# Patient Record
Sex: Female | Born: 1950 | Race: White | Hispanic: No | Smoking: Current every day smoker
Health system: Southern US, Community
[De-identification: ages and names within clinical notes are randomized; demographics above are authoritative.]

## PROBLEM LIST (undated history)

## (undated) DIAGNOSIS — E785 Hyperlipidemia, unspecified: Secondary | ICD-10-CM

## (undated) DIAGNOSIS — F419 Anxiety disorder, unspecified: Secondary | ICD-10-CM

## (undated) DIAGNOSIS — Z9189 Other specified personal risk factors, not elsewhere classified: Secondary | ICD-10-CM

## (undated) DIAGNOSIS — G250 Essential tremor: Secondary | ICD-10-CM

## (undated) DIAGNOSIS — N200 Calculus of kidney: Secondary | ICD-10-CM

## (undated) DIAGNOSIS — F410 Panic disorder [episodic paroxysmal anxiety] without agoraphobia: Secondary | ICD-10-CM

## (undated) DIAGNOSIS — J45909 Unspecified asthma, uncomplicated: Secondary | ICD-10-CM

## (undated) DIAGNOSIS — F32A Depression, unspecified: Secondary | ICD-10-CM

## (undated) DIAGNOSIS — C50919 Malignant neoplasm of unspecified site of unspecified female breast: Secondary | ICD-10-CM

## (undated) DIAGNOSIS — F329 Major depressive disorder, single episode, unspecified: Secondary | ICD-10-CM

## (undated) DIAGNOSIS — Z923 Personal history of irradiation: Secondary | ICD-10-CM

## (undated) DIAGNOSIS — J309 Allergic rhinitis, unspecified: Secondary | ICD-10-CM

## (undated) DIAGNOSIS — I10 Essential (primary) hypertension: Secondary | ICD-10-CM

## (undated) HISTORY — DX: Essential (primary) hypertension: I10

## (undated) HISTORY — DX: Panic disorder (episodic paroxysmal anxiety): F41.0

## (undated) HISTORY — PX: ABDOMINAL HYSTERECTOMY: SHX81

## (undated) HISTORY — DX: Essential tremor: G25.0

## (undated) HISTORY — DX: Malignant neoplasm of unspecified site of unspecified female breast: C50.919

## (undated) HISTORY — PX: LIPOSUCTION: SHX10

## (undated) HISTORY — DX: Allergic rhinitis, unspecified: J30.9

## (undated) HISTORY — DX: Major depressive disorder, single episode, unspecified: F32.9

## (undated) HISTORY — PX: CERVICAL ABLATION: SHX5771

## (undated) HISTORY — PX: FOOT SURGERY: SHX648

## (undated) HISTORY — DX: Depression, unspecified: F32.A

## (undated) HISTORY — DX: Unspecified asthma, uncomplicated: J45.909

## (undated) HISTORY — DX: Anxiety disorder, unspecified: F41.9

## (undated) HISTORY — PX: DILATION AND CURETTAGE OF UTERUS: SHX78

## (undated) HISTORY — DX: Hyperlipidemia, unspecified: E78.5

## (undated) HISTORY — PX: REDUCTION MAMMAPLASTY: SUR839

---

## 1997-09-15 ENCOUNTER — Emergency Department (HOSPITAL_COMMUNITY): Admission: EM | Admit: 1997-09-15 | Discharge: 1997-09-15 | Payer: Self-pay

## 1998-03-31 ENCOUNTER — Other Ambulatory Visit: Admission: RE | Admit: 1998-03-31 | Discharge: 1998-03-31 | Payer: Self-pay | Admitting: Gynecology

## 1999-01-13 ENCOUNTER — Encounter: Payer: Self-pay | Admitting: Gynecology

## 1999-01-13 ENCOUNTER — Encounter: Admission: RE | Admit: 1999-01-13 | Discharge: 1999-01-13 | Payer: Self-pay | Admitting: Gynecology

## 1999-03-03 ENCOUNTER — Other Ambulatory Visit: Admission: RE | Admit: 1999-03-03 | Discharge: 1999-03-03 | Payer: Self-pay | Admitting: Gynecology

## 1999-09-13 ENCOUNTER — Emergency Department (HOSPITAL_COMMUNITY): Admission: EM | Admit: 1999-09-13 | Discharge: 1999-09-13 | Payer: Self-pay | Admitting: Emergency Medicine

## 1999-12-04 ENCOUNTER — Emergency Department (HOSPITAL_COMMUNITY): Admission: EM | Admit: 1999-12-04 | Discharge: 1999-12-04 | Payer: Self-pay | Admitting: Emergency Medicine

## 1999-12-04 ENCOUNTER — Encounter: Payer: Self-pay | Admitting: Emergency Medicine

## 1999-12-19 ENCOUNTER — Encounter: Payer: Self-pay | Admitting: Cardiology

## 1999-12-19 ENCOUNTER — Ambulatory Visit (HOSPITAL_COMMUNITY): Admission: RE | Admit: 1999-12-19 | Discharge: 1999-12-19 | Payer: Self-pay | Admitting: Cardiology

## 2000-01-20 ENCOUNTER — Encounter: Admission: RE | Admit: 2000-01-20 | Discharge: 2000-01-20 | Payer: Self-pay | Admitting: Gynecology

## 2000-01-20 ENCOUNTER — Encounter: Payer: Self-pay | Admitting: Gynecology

## 2000-04-09 ENCOUNTER — Other Ambulatory Visit: Admission: RE | Admit: 2000-04-09 | Discharge: 2000-04-09 | Payer: Self-pay | Admitting: Gynecology

## 2001-02-11 ENCOUNTER — Encounter: Admission: RE | Admit: 2001-02-11 | Discharge: 2001-02-11 | Payer: Self-pay | Admitting: Gynecology

## 2001-02-11 ENCOUNTER — Encounter: Payer: Self-pay | Admitting: Gynecology

## 2001-04-25 ENCOUNTER — Other Ambulatory Visit: Admission: RE | Admit: 2001-04-25 | Discharge: 2001-04-25 | Payer: Self-pay | Admitting: Gynecology

## 2002-06-16 ENCOUNTER — Other Ambulatory Visit: Admission: RE | Admit: 2002-06-16 | Discharge: 2002-06-16 | Payer: Self-pay | Admitting: Gynecology

## 2002-06-17 ENCOUNTER — Encounter: Payer: Self-pay | Admitting: Gynecology

## 2002-06-17 ENCOUNTER — Encounter: Admission: RE | Admit: 2002-06-17 | Discharge: 2002-06-17 | Payer: Self-pay | Admitting: Gynecology

## 2003-01-06 ENCOUNTER — Emergency Department (HOSPITAL_COMMUNITY): Admission: EM | Admit: 2003-01-06 | Discharge: 2003-01-07 | Payer: Self-pay | Admitting: Emergency Medicine

## 2003-07-01 ENCOUNTER — Encounter: Admission: RE | Admit: 2003-07-01 | Discharge: 2003-07-01 | Payer: Self-pay | Admitting: Cardiology

## 2003-07-13 ENCOUNTER — Other Ambulatory Visit: Admission: RE | Admit: 2003-07-13 | Discharge: 2003-07-13 | Payer: Self-pay | Admitting: Obstetrics and Gynecology

## 2003-08-27 ENCOUNTER — Encounter (INDEPENDENT_AMBULATORY_CARE_PROVIDER_SITE_OTHER): Payer: Self-pay | Admitting: Specialist

## 2003-08-27 ENCOUNTER — Ambulatory Visit (HOSPITAL_COMMUNITY): Admission: RE | Admit: 2003-08-27 | Discharge: 2003-08-27 | Payer: Self-pay | Admitting: *Deleted

## 2004-03-14 ENCOUNTER — Ambulatory Visit: Payer: Self-pay | Admitting: Internal Medicine

## 2004-04-06 ENCOUNTER — Ambulatory Visit: Payer: Self-pay | Admitting: Internal Medicine

## 2004-04-11 ENCOUNTER — Ambulatory Visit: Payer: Self-pay | Admitting: Internal Medicine

## 2004-04-13 ENCOUNTER — Ambulatory Visit: Payer: Self-pay | Admitting: Internal Medicine

## 2004-05-09 ENCOUNTER — Ambulatory Visit: Payer: Self-pay | Admitting: Internal Medicine

## 2004-05-31 ENCOUNTER — Ambulatory Visit: Payer: Self-pay | Admitting: Internal Medicine

## 2004-06-07 ENCOUNTER — Ambulatory Visit: Payer: Self-pay | Admitting: Internal Medicine

## 2004-06-22 ENCOUNTER — Ambulatory Visit: Payer: Self-pay | Admitting: Internal Medicine

## 2004-08-24 ENCOUNTER — Ambulatory Visit: Payer: Self-pay | Admitting: Internal Medicine

## 2004-10-11 ENCOUNTER — Other Ambulatory Visit: Admission: RE | Admit: 2004-10-11 | Discharge: 2004-10-11 | Payer: Self-pay | Admitting: Obstetrics and Gynecology

## 2005-01-20 ENCOUNTER — Ambulatory Visit: Payer: Self-pay | Admitting: Internal Medicine

## 2005-03-16 ENCOUNTER — Ambulatory Visit: Payer: Self-pay | Admitting: Internal Medicine

## 2005-04-20 ENCOUNTER — Encounter: Payer: Self-pay | Admitting: Cardiology

## 2005-06-27 ENCOUNTER — Ambulatory Visit: Payer: Self-pay | Admitting: Internal Medicine

## 2005-12-08 ENCOUNTER — Encounter: Admission: RE | Admit: 2005-12-08 | Discharge: 2005-12-08 | Payer: Self-pay | Admitting: Obstetrics and Gynecology

## 2006-01-02 HISTORY — PX: EXCISION MORTON'S NEUROMA: SHX5013

## 2006-01-10 ENCOUNTER — Ambulatory Visit: Payer: Self-pay | Admitting: Internal Medicine

## 2006-01-23 ENCOUNTER — Encounter: Payer: Self-pay | Admitting: Vascular Surgery

## 2006-01-23 ENCOUNTER — Ambulatory Visit (HOSPITAL_COMMUNITY): Admission: RE | Admit: 2006-01-23 | Discharge: 2006-01-23 | Payer: Self-pay | Admitting: Orthopedic Surgery

## 2006-05-10 ENCOUNTER — Ambulatory Visit: Payer: Self-pay | Admitting: Internal Medicine

## 2006-05-15 ENCOUNTER — Ambulatory Visit: Payer: Self-pay | Admitting: Pain Medicine

## 2006-08-29 ENCOUNTER — Ambulatory Visit: Payer: Self-pay | Admitting: Internal Medicine

## 2007-03-19 ENCOUNTER — Ambulatory Visit: Payer: Self-pay | Admitting: Internal Medicine

## 2007-03-26 ENCOUNTER — Ambulatory Visit: Payer: Self-pay | Admitting: Internal Medicine

## 2007-03-26 DIAGNOSIS — I10 Essential (primary) hypertension: Secondary | ICD-10-CM | POA: Insufficient documentation

## 2007-03-26 DIAGNOSIS — H1045 Other chronic allergic conjunctivitis: Secondary | ICD-10-CM

## 2007-03-26 DIAGNOSIS — L509 Urticaria, unspecified: Secondary | ICD-10-CM

## 2007-03-26 DIAGNOSIS — J4520 Mild intermittent asthma, uncomplicated: Secondary | ICD-10-CM

## 2007-03-26 DIAGNOSIS — J3089 Other allergic rhinitis: Secondary | ICD-10-CM

## 2007-03-26 DIAGNOSIS — J302 Other seasonal allergic rhinitis: Secondary | ICD-10-CM

## 2007-05-09 ENCOUNTER — Encounter: Payer: Self-pay | Admitting: Internal Medicine

## 2007-09-18 ENCOUNTER — Ambulatory Visit: Payer: Self-pay | Admitting: Internal Medicine

## 2007-10-25 ENCOUNTER — Encounter: Payer: Self-pay | Admitting: Cardiology

## 2007-11-08 ENCOUNTER — Telehealth (INDEPENDENT_AMBULATORY_CARE_PROVIDER_SITE_OTHER): Payer: Self-pay | Admitting: *Deleted

## 2007-11-18 ENCOUNTER — Encounter: Admission: RE | Admit: 2007-11-18 | Discharge: 2007-11-18 | Payer: Self-pay | Admitting: Family Medicine

## 2008-03-26 ENCOUNTER — Ambulatory Visit: Payer: Self-pay | Admitting: Internal Medicine

## 2008-04-17 ENCOUNTER — Ambulatory Visit: Payer: Self-pay | Admitting: Internal Medicine

## 2008-07-25 ENCOUNTER — Emergency Department (HOSPITAL_COMMUNITY): Admission: EM | Admit: 2008-07-25 | Discharge: 2008-07-25 | Payer: Self-pay | Admitting: Emergency Medicine

## 2008-07-26 ENCOUNTER — Emergency Department (HOSPITAL_COMMUNITY): Admission: EM | Admit: 2008-07-26 | Discharge: 2008-07-26 | Payer: Self-pay | Admitting: Emergency Medicine

## 2008-07-27 ENCOUNTER — Ambulatory Visit: Payer: Self-pay | Admitting: Internal Medicine

## 2008-07-27 ENCOUNTER — Telehealth (INDEPENDENT_AMBULATORY_CARE_PROVIDER_SITE_OTHER): Payer: Self-pay | Admitting: *Deleted

## 2009-04-20 ENCOUNTER — Ambulatory Visit: Payer: Self-pay | Admitting: Internal Medicine

## 2009-11-10 ENCOUNTER — Ambulatory Visit: Payer: Self-pay | Admitting: Cardiology

## 2010-01-26 ENCOUNTER — Emergency Department (HOSPITAL_BASED_OUTPATIENT_CLINIC_OR_DEPARTMENT_OTHER)
Admission: EM | Admit: 2010-01-26 | Discharge: 2010-01-26 | Payer: Self-pay | Source: Home / Self Care | Admitting: Emergency Medicine

## 2010-02-01 NOTE — Assessment & Plan Note (Signed)
Summary: 12 months/apc   Primary Provider/Referring Provider:  Maryelizabeth Rowan  CC:  yearly follow up visit-reactions to meds; nasal drainage-yellow; wheezing(noticed for about 3 months); not taking allergy vaccine.Marland Kitchen  History of Present Illness: 03/26/08- Asthma, allergic rhinitis, allergic conjunctivitis Continues allergy vaccine, often forgets and averages about once or twice monthly. She does feel they help, but is reluctant to increase. No significant reactions. If she missed a couple or has a local reaction she will drop back a tenth. Perennial watery rhinorhea, nonseasonal, withut sneezel. Sleeps on therapeutic pilow. Travels a lot for pleasure. Airplane trip 3 days ago- ears popped more and has had some pain in right ear which has reovved. No chest tightness, wheeze or purulence. Chest feels ok. CXR last year reported minimal interstital prominence- former smoker.  July 27, 2008--Presents for acute office visit. Was on flagyl for bacterial infection, on last day  broke out in hives 3 days ago. Went  to ER on 7/24, was given benadryl, pepcid and steroids. Continued w/ l puffy eyes  facial swelling, throat tightness, went back to ER 7/25/ was given epi pen. today some better w/ hives but face still puffy. Mild tightness in throat, no dysphagia or cough. On prednisone taper 50mg  now.   April 20, 2009- Asthma, allergic rhinitis, allergic conjunctivitis Reviewed hx of urticaria with PCN and to Flagyl,  last year. Had a significant depression last year- working with psychiatrist. Many medicine changes. Dropped off allergy vaccine 3 months ago.She now sees yellow mucus from nose. Maxillary pressure, ears stopped up. She has a Neti pot. Using Flonase daily. Has had only minimal wheeze. Has a rescue inhaler - not used.      Current Medications (verified): 1)  Flonase 50 Mcg/act  Susp (Fluticasone Propionate) .Marland Kitchen.. 1 Spray in Eash Nostril Once Daily 2)  Zyrtec Allergy 10 Mg  Tabs (Cetirizine Hcl)  .... Take 1 By Mouth Once Daily 3)  Ipratropium Bromide 0.06 % Soln (Ipratropium Bromide) .Marland Kitchen.. 1-2 Sprays Each Nostril Three Times A Day As Needed 4)  Allergy Vaccine 1:50 Go (W-E) 5)  Epipen 2-Pak 0.3 Mg/0.37ml (1:1000)  Devi (Epinephrine Hcl (Anaphylaxis)) .... Use As Directed As Needed 6)  Toprol Xl 25 Mg Xr24h-Tab (Metoprolol Succinate) .... Take 1 By Mouth Two Times A Day 7)  Diovan 40 Mg  Tabs (Valsartan) .... Take 1 By Mouth Two Times A Day 8)  Fish Oil 1000 Mg  Caps (Omega-3 Fatty Acids) .... Take 1 By Mouth Two Times A Day 9)  Xanax 0.5 Mg  Tabs (Alprazolam) .... Take 1 By Mouth Once Daily As Needed (Not To Exceed 3/day) 10)  Advil 200 Mg  Tabs (Ibuprofen) .... As Needed 11)  Activella 0.5-0.1 Mg Tabs (Estradiol-Norethindrone Acet) .... Take 1 By Mouth Once Daily 12)  Trilipix 135 Mg Cpdr (Choline Fenofibrate) .... Take 1 By Mouth Once Daily 13)  Zantac 150 Mg Tabs (Ranitidine Hcl) .... As Needed 14)  Diphenhydramine Hcl (Sleep) 50 Mg Tabs (Diphenhydramine Hcl (Sleep)) .Marland Kitchen.. 1 Tab By Mouth Every 4 Hours As Needed 15)  Vitamin D 400 Unit Caps (Cholecalciferol) .... Take 1 By Mouth Once Daily 16)  Pristiq 50 Mg Xr24h-Tab (Desvenlafaxine Succinate) .... Take 1/2 By Mouth Once Daily 17)  Fexofenadine Hcl 180 Mg Tabs (Fexofenadine Hcl) .... Take 1 By Mouth Once Daily 18)  Fluoxetine Hcl 20 Mg Caps (Fluoxetine Hcl) .... Take 1 By Mouth Once Daily  Allergies: 1)  ! Sulfa 2)  ! * Oxycodone 3)  ! Amoxicillin 4)  !  Pcn 5)  ! Flagyl  Past History:  Past Medical History: Last updated: 03/26/2008 Allergic Rhinitis Asthma Hypertension Left breast reduction for benign asymmetry.  Past Surgical History: Last updated: 03/26/2008 D&C and Uterine ablation  Family History: Last updated: 03/26/2008 Father - heart disease Mother- seasonal allergies  Social History: Last updated: 03/26/2008 Patient states former smoker. -quit 1987 Married  Risk Factors: Smoking Status: quit  (03/26/2008)  Review of Systems      See HPI  The patient denies anorexia, fever, weight loss, weight gain, vision loss, decreased hearing, hoarseness, chest pain, syncope, dyspnea on exertion, peripheral edema, prolonged cough, headaches, hemoptysis, and severe indigestion/heartburn.    Vital Signs:  Patient profile:   60 year old female Height:      68 inches Weight:      170.13 pounds BMI:     25.96 O2 Sat:      99 % on Room air Pulse rate:   84 / minute BP sitting:   142 / 90  (left arm) Cuff size:   regular  Vitals Entered By: Reynaldo Minium CMA (April 20, 2009 1:41 PM)  O2 Flow:  Room air  Physical Exam  Additional Exam:  General: A/Ox3; pleasant and cooperative, NAD, wdwn SKIN: no rash, lesions NODES: no lymphadenopathy HEENT: [place]/AT, EOM- WNL, Conjuctivae- clear, PERRLA, TM-WNL, Nose- clear mucus, Throat- clear and wnl, Mallampati II NECK: Supple w/ fair ROM, JVD- none, normal carotid impulses w/o bruits Thyroid CHEST: Clear to P&A- no crackles heard HEART: RRR, no m/g/r heard ABDOMEN:  EAV:WUJW, nl pulses, no edema  NEURO: Grossly intact to observation       Impression & Recommendations:  Problem # 1:  ALLERGIC RHINITIS (ICD-477.9)  We will leave her off allergy vaccine. Suggest nonsedating antihistamine if needed. Encourage Neti pot. Her updated medication list for this problem includes:    Flonase 50 Mcg/act Susp (Fluticasone propionate) .Marland Kitchen... 1 spray in eash nostril once daily    Zyrtec Allergy 10 Mg Tabs (Cetirizine hcl) .Marland Kitchen... Take 1 by mouth once daily    Ipratropium Bromide 0.06 % Soln (Ipratropium bromide) .Marland Kitchen... 1-2 sprays each nostril three times a day as needed    Fexofenadine Hcl 180 Mg Tabs (Fexofenadine hcl) .Marland Kitchen... Take 1 by mouth once daily  Problem # 2:  Hx of URTICARIA (ICD-708.9)  2 episodes of urticaria. These may have  been related to 2 different antibiotics, but that seems unlikely out of the blue. She is maintaining on Zyrtec for now. We  discussed common triggers for urticaria/ angioedema.  Problem # 3:  ASTHMA (ICD-493.90) controlled  Medications Added to Medication List This Visit: 1)  Epipen 0.3 Mg/0.59ml Devi (Epinephrine) .... For severe allergic reaction 2)  Vitamin D 400 Unit Caps (Cholecalciferol) .... Take 1 by mouth once daily 3)  Pristiq 50 Mg Xr24h-tab (Desvenlafaxine succinate) .... Take 1/2 by mouth once daily 4)  Fexofenadine Hcl 180 Mg Tabs (Fexofenadine hcl) .... Take 1 by mouth once daily 5)  Fluoxetine Hcl 20 Mg Caps (Fluoxetine hcl) .... Take 1 by mouth once daily 6)  Zithromax Z-pak 250 Mg Tabs (Azithromycin) .... 2 today then one daily  Other Orders: Est. Patient Level III (11914)  Patient Instructions: 1)  Please schedule a follow-up appointment in 6 months. 2)  We are stopping allergy vaccine 3)  OK to use Zyrtec/ cetirizine. You can substitute or add a second antihistamine like Allegra/ fexofenadine or Claritin/ loratadine if needed.  4)  Remember your Neti pot 5)  Script  to hold for z pak Prescriptions: ZITHROMAX Z-PAK 250 MG TABS (AZITHROMYCIN) 2 today then one daily  #1 pak x 0   Entered and Authorized by:   Waymon Budge MD   Signed by:   Waymon Budge MD on 04/20/2009   Method used:   Print then Give to Patient   RxID:   [id number] IPRATROPIUM BROMIDE 0.06 % SOLN (IPRATROPIUM BROMIDE) 1-2 sprays each nostril three times a day as needed  #1 x prn   Entered and Authorized by:   Waymon Budge MD   Signed by:   Waymon Budge MD on 04/20/2009   Method used:   Electronically to        [facility]  [phone][facility] (retail)       [address] [place], [place]  [address]       Ph: [phone] or [phone]       Fax: [phone]   RxID:   [id number] FLONASE 50 MCG/ACT  SUSP (FLUTICASONE PROPIONATE) 1 spray in eash nostril once daily  #1 x prn   Entered and Authorized by:   Waymon Budge MD   Signed by:   Waymon Budge MD on 04/20/2009   Method used:    [facility] to        [phone] (retail)       [address] [place], [place]  [address]       Ph: [phone] or [phone]       Fax: [phone]   RxID:   [id number] EPIPEN 0.3 MG/0.3ML DEVI (EPINEPHRINE) For severe allergic reaction  #1 x prn   Entered and Authorized by:   Waymon Budge MD   Signed by:   Waymon Budge MD on 04/20/2009   Method used:   Print then Give to Patient   RxID:   [id number]

## 2010-05-17 NOTE — Assessment & Plan Note (Signed)
[facility]                             PULMONARY OFFICE NOTE   CATHLINE, DOWEN                  MRN:          [id number]  DATE:05/10/2006                            DOB:          [birth year]    PULMONARY OUTPATIENT FOLLOWUP   PROBLEM:  1. Allergic rhinitis.  2. Asthma.  3. Allergic conjunctivitis.  4. History of urticaria.  5. Hypertension.   HISTORY:  One year followup.  She has done extremely well and is quite  pleased with her symptom control now.  This has been a bad spring for  some people around her, but she feels that her allergy vaccine at 1:50,  Flonase and Zyrtec are providing good control.  She only occasionally  uses ipratropium 0.06% nasal spray and has not needed albuterol in a  very long time with no chest tightness or wheeze.  We discussed allergy  vaccine, risk and benefit, long term goals and issues of administration  outside of a medical office, anaphylaxis and epinephrine.  We refilled  her EpiPen which she has never needed, but understands.   MEDICATION:  1. Zyrtec.  2. Flonase.  3. Allergy vaccine.  4. Lasix.  5. Toprol.  6. Diovan.  7. Ipratropium 0.06% nasal spray.  8. Vivelle patch.  9. Prometrium.  10.Spinal injections.  11.Fish oil.  12.Xanax.  13.Potassium.  14.Rare need for albuterol inhaler.  15.EpiPen is available.   Drug intolerant to SULFA with urticaria.   OBJECTIVE:  Weight 170 pounds.  BP 110/80, pulse 64.  Room air  saturation 98%.  Eyes, nose and throat are clear.  CHEST:  Is clear.  HEART:  Sounds regular without murmur.  No evident rash on exposed skin.   IMPRESSION:  Good control of allergic rhinitis and allergic  conjunctivitis.  Asthma is essentially inactive.   PLAN:  Continue present therapy  Schedule return in 1 year.  Educational  discussion, review of risk etc. as above.  Questions answered.     Clinton D. Maple Hudson, MD, Tonny Bollman, FACP  Electronically Signed    CDY/MedQ   DD: 05/10/2006  DT: 05/11/2006  Job #: [id number]   cc:   C. Duane Lope, M.D.  Colleen Can. Deborah Chalk, M.D.

## 2011-02-22 ENCOUNTER — Other Ambulatory Visit: Payer: Self-pay | Admitting: Obstetrics and Gynecology

## 2011-02-22 DIAGNOSIS — R928 Other abnormal and inconclusive findings on diagnostic imaging of breast: Secondary | ICD-10-CM

## 2011-03-02 ENCOUNTER — Ambulatory Visit
Admission: RE | Admit: 2011-03-02 | Discharge: 2011-03-02 | Disposition: A | Discharge status: Home / Self Care | Payer: 59 | Source: Ambulatory Visit | Attending: Obstetrics and Gynecology | Admitting: Obstetrics and Gynecology

## 2011-03-02 DIAGNOSIS — R928 Other abnormal and inconclusive findings on diagnostic imaging of breast: Secondary | ICD-10-CM

## 2011-04-12 ENCOUNTER — Encounter: Payer: Self-pay | Admitting: *Deleted

## 2011-07-26 ENCOUNTER — Telehealth: Payer: Self-pay | Admitting: Nurse Practitioner

## 2011-07-26 NOTE — Telephone Encounter (Signed)
Will forward to [facility].  Need to find old [place] dictation as to why he wanted pt taking Diovan instead of generic and fax to Larita Fife, MD.

## 2011-07-26 NOTE — Telephone Encounter (Signed)
New msg Pt wants to know why she cant generic for diovan. She was a former Music therapist pt. Please call

## 2011-09-25 ENCOUNTER — Telehealth: Payer: Self-pay | Admitting: Nurse Practitioner

## 2011-09-25 NOTE — Telephone Encounter (Signed)
Patient would like to speak with Dawayne Patricia. To discuss medication  plz return call to patient, [phone]

## 2011-09-25 NOTE — Telephone Encounter (Signed)
Sent dictation that stated allergies to ARB and ACE to pcp and patient

## 2011-09-25 NOTE — Telephone Encounter (Signed)
Needs to know why she can not take Diovan generic for her insurance to cover. Have requested her chart from medical records

## 2011-09-29 ENCOUNTER — Encounter: Payer: Self-pay | Admitting: Cardiology

## 2011-10-06 ENCOUNTER — Encounter: Payer: Self-pay | Admitting: Cardiology

## 2012-04-16 ENCOUNTER — Other Ambulatory Visit: Payer: Self-pay | Admitting: Obstetrics and Gynecology

## 2012-04-16 DIAGNOSIS — N644 Mastodynia: Secondary | ICD-10-CM

## 2012-04-30 ENCOUNTER — Ambulatory Visit
Admission: RE | Admit: 2012-04-30 | Discharge: 2012-04-30 | Disposition: A | Discharge status: Home / Self Care | Payer: 59 | Source: Ambulatory Visit | Attending: Obstetrics and Gynecology | Admitting: Obstetrics and Gynecology

## 2012-04-30 DIAGNOSIS — N644 Mastodynia: Secondary | ICD-10-CM

## 2013-01-15 ENCOUNTER — Emergency Department (HOSPITAL_COMMUNITY): Payer: 59

## 2013-01-15 ENCOUNTER — Encounter (HOSPITAL_COMMUNITY): Payer: Self-pay | Admitting: Emergency Medicine

## 2013-01-15 ENCOUNTER — Emergency Department (HOSPITAL_COMMUNITY)
Admission: EM | Admit: 2013-01-15 | Discharge: 2013-01-15 | Disposition: A | Discharge status: Home / Self Care | Payer: 59 | Attending: Emergency Medicine | Admitting: Emergency Medicine

## 2013-01-15 DIAGNOSIS — Z880 Allergy status to penicillin: Secondary | ICD-10-CM | POA: Insufficient documentation

## 2013-01-15 DIAGNOSIS — N200 Calculus of kidney: Secondary | ICD-10-CM

## 2013-01-15 DIAGNOSIS — R630 Anorexia: Secondary | ICD-10-CM | POA: Insufficient documentation

## 2013-01-15 DIAGNOSIS — N23 Unspecified renal colic: Secondary | ICD-10-CM

## 2013-01-15 DIAGNOSIS — R143 Flatulence: Secondary | ICD-10-CM

## 2013-01-15 DIAGNOSIS — R141 Gas pain: Secondary | ICD-10-CM | POA: Insufficient documentation

## 2013-01-15 DIAGNOSIS — F3289 Other specified depressive episodes: Secondary | ICD-10-CM | POA: Insufficient documentation

## 2013-01-15 DIAGNOSIS — F329 Major depressive disorder, single episode, unspecified: Secondary | ICD-10-CM | POA: Insufficient documentation

## 2013-01-15 DIAGNOSIS — F411 Generalized anxiety disorder: Secondary | ICD-10-CM | POA: Insufficient documentation

## 2013-01-15 DIAGNOSIS — Z79899 Other long term (current) drug therapy: Secondary | ICD-10-CM | POA: Insufficient documentation

## 2013-01-15 DIAGNOSIS — Z8719 Personal history of other diseases of the digestive system: Secondary | ICD-10-CM | POA: Insufficient documentation

## 2013-01-15 DIAGNOSIS — R142 Eructation: Secondary | ICD-10-CM | POA: Insufficient documentation

## 2013-01-15 DIAGNOSIS — Z8639 Personal history of other endocrine, nutritional and metabolic disease: Secondary | ICD-10-CM | POA: Insufficient documentation

## 2013-01-15 DIAGNOSIS — Z862 Personal history of diseases of the blood and blood-forming organs and certain disorders involving the immune mechanism: Secondary | ICD-10-CM | POA: Insufficient documentation

## 2013-01-15 DIAGNOSIS — I10 Essential (primary) hypertension: Secondary | ICD-10-CM | POA: Insufficient documentation

## 2013-01-15 LAB — CBC WITH DIFFERENTIAL/PLATELET
Basophils Absolute: 0.1 10*3/uL (ref 0.0–0.1)
Basophils Relative: 1 % (ref 0–1)
Eosinophils Absolute: 0.3 10*3/uL (ref 0.0–0.7)
Eosinophils Relative: 5 % (ref 0–5)
HCT: 45.7 % (ref 36.0–46.0)
HEMOGLOBIN: 15.7 g/dL — AB (ref 12.0–15.0)
LYMPHS ABS: 1.5 10*3/uL (ref 0.7–4.0)
LYMPHS PCT: 24 % (ref 12–46)
MCH: 35 pg — AB (ref 26.0–34.0)
MCHC: 34.4 g/dL (ref 30.0–36.0)
MCV: 101.8 fL — ABNORMAL HIGH (ref 78.0–100.0)
MONOS PCT: 9 % (ref 3–12)
Monocytes Absolute: 0.6 10*3/uL (ref 0.1–1.0)
NEUTROS ABS: 4 10*3/uL (ref 1.7–7.7)
NEUTROS PCT: 62 % (ref 43–77)
PLATELETS: 239 10*3/uL (ref 150–400)
RBC: 4.49 MIL/uL (ref 3.87–5.11)
RDW: 13.5 % (ref 11.5–15.5)
WBC: 6.5 10*3/uL (ref 4.0–10.5)

## 2013-01-15 LAB — URINALYSIS, ROUTINE W REFLEX MICROSCOPIC
BILIRUBIN URINE: NEGATIVE
GLUCOSE, UA: NEGATIVE mg/dL
Ketones, ur: NEGATIVE mg/dL
Leukocytes, UA: NEGATIVE
Nitrite: NEGATIVE
PH: 6 (ref 5.0–8.0)
Protein, ur: 30 mg/dL — AB
SPECIFIC GRAVITY, URINE: 1.013 (ref 1.005–1.030)
Urobilinogen, UA: 0.2 mg/dL (ref 0.0–1.0)

## 2013-01-15 LAB — COMPREHENSIVE METABOLIC PANEL
ALK PHOS: 49 U/L (ref 39–117)
ALT: 24 U/L (ref 0–35)
AST: 27 U/L (ref 0–37)
Albumin: 4 g/dL (ref 3.5–5.2)
BUN: 14 mg/dL (ref 6–23)
CO2: 23 mEq/L (ref 19–32)
Calcium: 9.7 mg/dL (ref 8.4–10.5)
Chloride: 95 mEq/L — ABNORMAL LOW (ref 96–112)
Creatinine, Ser: 0.72 mg/dL (ref 0.50–1.10)
GFR calc non Af Amer: >90 mL/min (ref >90)
GLUCOSE: 104 mg/dL — AB (ref 70–99)
POTASSIUM: 4 meq/L (ref 3.7–5.3)
SODIUM: 136 meq/L — AB (ref 137–147)
Total Bilirubin: 0.5 mg/dL (ref 0.3–1.2)
Total Protein: 7.8 g/dL (ref 6.0–8.3)

## 2013-01-15 LAB — URINE MICROSCOPIC-ADD ON

## 2013-01-15 LAB — LIPASE, BLOOD: Lipase: 30 U/L (ref 11–59)

## 2013-01-15 MED ORDER — ONDANSETRON 4 MG PO TBDP: 4.0000 mg | ORAL_TABLET | Freq: Three times a day (TID) | ORAL | Status: DC | PRN

## 2013-01-15 MED ORDER — OXYCODONE-ACETAMINOPHEN 5-325 MG PO TABS: 1.0000 | ORAL_TABLET | ORAL | Status: DC | PRN

## 2013-01-15 MED ORDER — NAPROXEN 500 MG PO TABS: 500.0000 mg | ORAL_TABLET | Freq: Two times a day (BID) | ORAL | Status: DC

## 2013-01-15 MED ORDER — TAMSULOSIN HCL 0.4 MG PO CAPS: 0.4000 mg | ORAL_CAPSULE | Freq: Two times a day (BID) | ORAL | Status: DC

## 2013-01-15 MED ORDER — MORPHINE SULFATE 4 MG/ML IJ SOLN
4.0000 mg | Freq: Once | INTRAMUSCULAR | Status: AC
  Administered 2013-01-15: 4 mg via INTRAVENOUS
  Filled 2013-01-15: qty 1

## 2013-01-15 MED ORDER — ONDANSETRON HCL 4 MG/2ML IJ SOLN
4.0000 mg | Freq: Once | INTRAMUSCULAR | Status: AC
  Administered 2013-01-15: 4 mg via INTRAVENOUS
  Filled 2013-01-15: qty 2

## 2013-01-15 NOTE — ED Notes (Signed)
Pt was with husband who was being discharged from hospital, had sudden onset of severe abdominal pain bilaterally, worse on left. Reports blood in urine since today. Pain constant. Pt diaphoretic and tearful in triage.

## 2013-01-15 NOTE — Discharge Instructions (Signed)
Follow up with Urology in 2 days. Call and make appointment as soon as possible. Take medications as directed. Return to ED should you develop fever/chills, unable to urinate, or pain becomes unbearable.

## 2013-01-15 NOTE — ED Provider Notes (Signed)
Medical screening examination/treatment/procedure(s) were conducted as a shared visit with non-physician practitioner(s) and myself.  I personally evaluated the patient during the encounter.  EKG Interpretation   None      Patient here with acute onset of bilateral flank pain. Abdominal exam is nonsurgical. Abdominal CT shows a kidney stone. Her pain is controlled and she'll be given urology referral  Leota Jacobsen, MD 01/15/13 1626

## 2013-01-15 NOTE — ED Notes (Signed)
Discharge instructions reviewed with pt. Pt verbalized understanding.   

## 2013-01-15 NOTE — ED Provider Notes (Signed)
CSN: [id number]     Arrival date & time 01/15/13  1403 History   First MD Initiated Contact with Patient 01/15/13 1419     Chief Complaint  Patient presents with  . Abdominal Pain  . Nausea  . Hematuria   (Consider location/radiation/quality/duration/timing/severity/associated sxs/prior Treatment) Patient is a 63 y.o. female presenting with abdominal pain and hematuria.  Abdominal Pain Associated symptoms: hematuria   Associated symptoms: no chest pain, no chills, no fever and no shortness of breath   Hematuria Associated symptoms include abdominal pain. Pertinent negatives include no chest pain, chills or fever.   63 yo female presents with acute onset LEFT flank pain that radiates to pelvis. Pain started at 5 am this morning. Pain crampy, with intermittent sharp shooting pains in pelvis. Pain rated currently at 5/10 though much worse earlier. Patient has not tried anything for pain. Never had these symptoms before. Admits to hematuria today with dysuria, increased frequency, and urgency. Patient admits to constipation, nausea, and states she feels very bloated,. Denies Vomiting / Diarrhea.  Patient denies hx of abdominal surgeries. Admits to family hx of kidney stones. Recent colonoscopy last year with 3 yr follow up scheduled. Patient PMH significant for HTN, Hiatal Hernia.   Patient drinks a glass of wine per night.    Past Medical History  Diagnosis Date  . HTN (hypertension)   . Anxiety   . Depression   . HLD (hyperlipidemia)   . Hiatal hernia    Past Surgical History  Procedure Laterality Date  . Cervical ablation    . Foot surgery     Family History  Problem Relation Age of Onset  . Heart attack    . Coronary artery disease    . Hypertension    . Diabetes     History  Substance Use Topics  . Smoking status: Never Smoker   . Smokeless tobacco: Not on file  . Alcohol Use: Yes     Comment: social   OB History   Grav Para Term Preterm Abortions TAB SAB Ect Mult  Living                 Review of Systems  Constitutional: Positive for appetite change (poor appetite x 1 day). Negative for fever and chills.  Eyes: Negative for visual disturbance.  Respiratory: Negative for chest tightness and shortness of breath.   Cardiovascular: Negative for chest pain and leg swelling.  Gastrointestinal: Positive for abdominal pain and abdominal distention. Negative for blood in stool.  Genitourinary: Positive for hematuria, flank pain, difficulty urinating and pelvic pain.  Allergic/Immunologic: Negative for immunocompromised state.  All other systems reviewed and are negative.    Allergies  Amoxicillin; Metronidazole; Penicillins; and Sulfonamide derivatives  Home Medications   Current Outpatient Rx  Name  Route  Sig  Dispense  Refill  . ALPRAZolam (XANAX) 0.5 MG tablet   Oral   Take 0.5 mg by mouth daily as needed for anxiety.          Marland Kitchen BYSTOLIC 10 MG tablet   Oral   Take 10 mg by mouth daily.         . cetirizine (ZYRTEC) 10 MG tablet   Oral   Take 10 mg by mouth daily.         . cloNIDine (CATAPRES) 0.2 MG tablet   Oral   Take 0.2 mg by mouth daily.         Marland Kitchen desvenlafaxine (PRISTIQ) 50 MG 24 hr tablet  Oral   Take 50 mg by mouth daily.          . famotidine (PEPCID AC) 10 MG tablet   Oral   Take 10 mg by mouth daily as needed for heartburn or indigestion.         . fenofibrate 160 MG tablet   Oral   Take 160 mg by mouth at bedtime.          . fish oil-omega-3 fatty acids 1000 MG capsule   Oral   Take 1 g by mouth 2 (two) times daily.          Marland Kitchen FLUoxetine (PROZAC) 20 MG capsule   Oral   Take 20 mg by mouth daily.         . fluticasone (FLONASE) 50 MCG/ACT nasal spray   Nasal   Place 2 sprays into the nose daily.         Marland Kitchen ibuprofen (ADVIL,MOTRIN) 200 MG tablet   Oral   Take 400 mg by mouth every 6 (six) hours as needed for moderate pain.         . valsartan (DIOVAN) 40 MG tablet   Oral   Take 40  mg by mouth 2 (two) times daily.           BP 186/93  Pulse 69  Temp(Src) 98 F (36.7 C) (Oral)  Resp 24  SpO2 100% Physical Exam  Nursing note and vitals reviewed. Constitutional: She is oriented to person, place, and time. She appears well-developed and well-nourished. No distress.  HENT:  Head: Normocephalic and atraumatic.  Cardiovascular: Normal rate and regular rhythm.  Exam reveals no gallop and no friction rub.   No murmur heard. Pulmonary/Chest: Effort normal and breath sounds normal. No respiratory distress. She has no wheezes. She has no rales.  Abdominal: Soft. Bowel sounds are normal. She exhibits distension. There is no hepatosplenomegaly. There is tenderness in the suprapubic area. There is no rigidity, no rebound, no guarding, no CVA tenderness, no tenderness at McBurney's point and negative Murphy's sign.  Musculoskeletal: Normal range of motion. She exhibits no edema.  Neurological: She is alert and oriented to person, place, and time.  Skin: Skin is warm and dry. She is not diaphoretic.  Psychiatric: She has a normal mood and affect. Her behavior is normal.    ED Course  Procedures (including critical care time) Labs Review Labs Reviewed  CBC WITH DIFFERENTIAL - Abnormal; Notable for the following:    Hemoglobin 15.7 (*)    MCV 101.8 (*)    MCH 35.0 (*)    All other components within normal limits  COMPREHENSIVE METABOLIC PANEL - Abnormal; Notable for the following:    Sodium 136 (*)    Chloride 95 (*)    Glucose, Bld 104 (*)    All other components within normal limits  URINALYSIS, ROUTINE W REFLEX MICROSCOPIC - Abnormal; Notable for the following:    APPearance CLOUDY (*)    Hgb urine dipstick LARGE (*)    Protein, ur 30 (*)    All other components within normal limits  LIPASE, BLOOD  URINE MICROSCOPIC-ADD ON   Imaging Review Ct Abdomen Pelvis Wo Contrast  01/15/2013   CLINICAL DATA:  Sudden onset severe upper pelvic pain, gross hematuria  EXAM:  CT ABDOMEN AND PELVIS WITHOUT CONTRAST  TECHNIQUE: Multidetector CT imaging of the abdomen and pelvis was performed following the standard protocol without intravenous contrast.  COMPARISON:  None.  FINDINGS: Renal: There is 4 mm  calculus in the mid left kidney. There is a mild hydroureter on the left. There is mild stranding in the left renal hilum and along the course of the ureter. There is potential calculus at the vesicoureteral junction on the left. This is seen on the sagittal projection and very faintly (image 69, series coronal). This measures 1-2 mm. On a thin reconstructed data set this stone is seen on image 43. There is no right nephrolithiasis or obstructive uropathy.  Lung bases are clear No focal hepatic lesion on this noncontrast exam. Gallbladder, pancreas, spleen, adrenal glands are normal. Several low-density lesions within the spleen which likely represents is.  The stomach, small bowel, and colon are normal.  Abdominal aorta is normal caliber. There is a left retro aortic renal vein.  No free fluid the pelvis. Uterus and ovaries are normal. There are no stones within the bladder.  Insert negative bone  IMPRESSION: 1. Small 1 mm obstructing calculus at the left vesicoureteral junction. 2. Nonobstructing calculus in the left renal hilum.   Electronically Signed   By: Suzy Bouchard M.D.   On: 01/15/2013 15:41    EKG Interpretation   None       MDM   1. Nephrolithiasis   2. Renal colic on left side    Patient afebrile. Elevate BP suspect secondary to acute pain (patient tearful in room due to pain).   CT positive for LEFT Renal stone measured at 4 mm. Small 34mm obstructing calculus of LEFT Vesicoureteral junction.  Large hgb on UA, no signs of UTI. Lipase normal.   Discussed labs, and exam findings with patient. Recommend return to ED should symptoms worsen. Patient agrees with plan.  Follow up with Urology in 2 days. Call and make appointment as soon as possible. Take  medications as directed. Return to ED should you develop fever/chills, unable to urinate, or pain becomes unbearable.   Patient pain controlled in ED. Plan to have patient follow up with Urology in 2 days. Will treat patients sxs. Patient agrees with plan. Discharged in good condition.    Meds given in ED:  Medications  ondansetron (ZOFRAN) injection 4 mg (4 mg Intravenous Given 01/15/13 1507)  morphine 4 MG/ML injection 4 mg (4 mg Intravenous Given 01/15/13 1507)    Discharge Medication List as of 01/15/2013  4:26 PM    START taking these medications   Details  naproxen (NAPROSYN) 500 MG tablet Take 1 tablet (500 mg total) by mouth 2 (two) times daily with a meal., Starting 01/15/2013, Until Discontinued, Print    ondansetron (ZOFRAN ODT) 4 MG disintegrating tablet Take 1 tablet (4 mg total) by mouth every 8 (eight) hours as needed for nausea., Starting 01/15/2013, Until Discontinued, Print    oxyCODONE-acetaminophen (PERCOCET) 5-325 MG per tablet Take 1 tablet by mouth every 4 (four) hours as needed., Starting 01/15/2013, Until Discontinued, Print    tamsulosin (FLOMAX) 0.4 MG CAPS capsule Take 1 capsule (0.4 mg total) by mouth 2 (two) times daily., Starting 01/15/2013, Until Discontinued, Print          Sherrie George, [place] 01/15/13 2123

## 2013-01-15 NOTE — ED Notes (Signed)
Pt has been having painful urination with blood in urine on and off. Today pt states she developed some abd cramping and some sharp shooting pain in groin and vagina. Pt states she hasn't had a bowel movement in 3 days, pt states she normally has one daily. Pt has been going through some stress recently with death in family.

## 2013-01-15 NOTE — ED Notes (Signed)
Pt states after she urinated she felt better, but still feels as though she has to pee.

## 2013-01-16 NOTE — ED Provider Notes (Signed)
Medical screening examination/treatment/procedure(s) were conducted as a shared visit with non-physician practitioner(s) and myself.  I personally evaluated the patient during the encounter.  EKG Interpretation   None        Leota Jacobsen, MD 01/16/13 1555

## 2013-02-21 ENCOUNTER — Encounter (HOSPITAL_COMMUNITY): Payer: Self-pay | Admitting: Pharmacist

## 2013-02-25 NOTE — H&P (Signed)
63 year old G 2 P 2 with history of abnormal bleeding.  She had taken some hormone therapy from some reproductive hormone clinic that I am unaware of and because of that, she had developed some bleeding.  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  She was advised to come back and have a D and C but she did not and the bleeding continues. She has had an endometrial ablation.    Medical history  Hypertension  Surgical History Endometrial ablation  Meds  See list  Allergic to sulfa, flagyl, penicillin  Afebrile vss General alert and oriented Lung CTAB Car RRR Abdomen is soft and nontender Pelvic is unremarkable  IMPRESSION: Postmenopausal bleeding  PLAN: LAVH and BSO Risks reviewed consent signed.

## 2013-03-05 ENCOUNTER — Encounter (HOSPITAL_COMMUNITY): Payer: Self-pay

## 2013-03-05 ENCOUNTER — Encounter (INDEPENDENT_AMBULATORY_CARE_PROVIDER_SITE_OTHER): Payer: Self-pay

## 2013-03-05 ENCOUNTER — Encounter (HOSPITAL_COMMUNITY)
Admission: RE | Admit: 2013-03-05 | Discharge: 2013-03-05 | Disposition: A | Discharge status: Home / Self Care | Payer: 59 | Source: Ambulatory Visit | Attending: Obstetrics and Gynecology | Admitting: Obstetrics and Gynecology

## 2013-03-05 ENCOUNTER — Other Ambulatory Visit: Payer: Self-pay

## 2013-03-05 DIAGNOSIS — Z01810 Encounter for preprocedural cardiovascular examination: Secondary | ICD-10-CM | POA: Insufficient documentation

## 2013-03-05 DIAGNOSIS — Z01812 Encounter for preprocedural laboratory examination: Secondary | ICD-10-CM | POA: Insufficient documentation

## 2013-03-05 LAB — BASIC METABOLIC PANEL
BUN: 18 mg/dL (ref 6–23)
CALCIUM: 9.4 mg/dL (ref 8.4–10.5)
CO2: 27 meq/L (ref 19–32)
CREATININE: 0.78 mg/dL (ref 0.50–1.10)
Chloride: 95 mEq/L — ABNORMAL LOW (ref 96–112)
GFR calc Af Amer: >90 mL/min (ref >90)
GFR calc non Af Amer: 89 mL/min — ABNORMAL LOW (ref >90)
Glucose, Bld: 102 mg/dL — ABNORMAL HIGH (ref 70–99)
Potassium: 4.3 mEq/L (ref 3.7–5.3)
Sodium: 135 mEq/L — ABNORMAL LOW (ref 137–147)

## 2013-03-05 LAB — CBC
HCT: 41.2 % (ref 36.0–46.0)
Hemoglobin: 13.9 g/dL (ref 12.0–15.0)
MCH: 34 pg (ref 26.0–34.0)
MCHC: 33.7 g/dL (ref 30.0–36.0)
MCV: 100.7 fL — AB (ref 78.0–100.0)
PLATELETS: 226 10*3/uL (ref 150–400)
RBC: 4.09 MIL/uL (ref 3.87–5.11)
RDW: 13.2 % (ref 11.5–15.5)
WBC: 5.7 10*3/uL (ref 4.0–10.5)

## 2013-03-05 NOTE — Patient Instructions (Signed)
[place]  03/05/2013   Your procedure is scheduled on:  03/10/13  Enter through the Main Entrance of [facility] at [place] up the phone at the desk and dial [phone].   Call this number if you have problems the morning of surgery: [phone]   Remember:   Do not eat food:After Midnight.  Do not drink clear liquids: After Midnight.  Take these medicines the morning of surgery with A SIP OF WATER: AM medications   Do not wear jewelry, make-up or nail polish.  Do not wear lotions, powders, or perfumes. You may wear deodorant.  Do not shave 24hours prior to surgery.  Do not bring valuables to the hospital.  [facility] is not   responsible for any belongings or valuables brought to the hospital.  Contacts, dentures or bridgework may not be worn into surgery.  Leave suitcase in the car. After surgery it may be brought to your room.  For patients admitted to the hospital, checkout time is 11:00 AM the day of              discharge.   Patients discharged the day of surgery will not be allowed to drive             home.  Name and phone number of your driver: NA  Special Instructions:      Please read over the following fact sheets that you were given:   Surgical Site Infection Prevention

## 2013-03-09 ENCOUNTER — Encounter (HOSPITAL_COMMUNITY): Payer: Self-pay | Admitting: Anesthesiology

## 2013-03-09 MED ORDER — GENTAMICIN SULFATE 40 MG/ML IJ SOLN
INTRAVENOUS | Status: DC
  Filled 2013-03-09: qty 9

## 2013-03-09 NOTE — Anesthesia Preprocedure Evaluation (Deleted)
Anesthesia Evaluation    Airway       Dental   Pulmonary asthma , former smoker,          Cardiovascular hypertension, Pt. on medications and Pt. on home beta blockers     Neuro/Psych PSYCHIATRIC DISORDERS Anxiety Depression negative neurological ROS     GI/Hepatic negative GI ROS, Neg liver ROS,   Endo/Other  Hyperlipidemia  Renal/GU negative Renal ROS  negative genitourinary   Musculoskeletal negative musculoskeletal ROS (+)   Abdominal   Peds  Hematology negative hematology ROS (+)   Anesthesia Other Findings   Reproductive/Obstetrics PMB                          Anesthesia Physical Anesthesia Plan  ASA: II  Anesthesia Plan: General   Post-op Pain Management:    Induction: Intravenous  Airway Management Planned: Oral ETT  Additional Equipment:   Intra-op Plan:   Post-operative Plan: Extubation in OR  Informed Consent: I have reviewed the patients History and Physical, chart, labs and discussed the procedure including the risks, benefits and alternatives for the proposed anesthesia with the patient or authorized representative who has indicated his/her understanding and acceptance.   Dental advisory given  Plan Discussed with: Anesthesiologist, CRNA and Surgeon  Anesthesia Plan Comments:         Anesthesia Quick Evaluation

## 2013-03-10 ENCOUNTER — Ambulatory Visit (HOSPITAL_COMMUNITY): Admission: RE | Admit: 2013-03-10 | Payer: 59 | Source: Ambulatory Visit | Admitting: Obstetrics and Gynecology

## 2013-03-10 ENCOUNTER — Encounter (HOSPITAL_COMMUNITY): Admission: RE | Payer: Self-pay | Source: Ambulatory Visit

## 2013-03-10 SURGERY — HYSTERECTOMY, VAGINAL, LAPAROSCOPY-ASSISTED
Anesthesia: General

## 2013-04-15 ENCOUNTER — Encounter (HOSPITAL_COMMUNITY): Payer: Self-pay | Admitting: Pharmacist

## 2013-04-22 ENCOUNTER — Encounter (HOSPITAL_COMMUNITY): Payer: Self-pay

## 2013-04-22 ENCOUNTER — Encounter: Payer: Self-pay | Admitting: Internal Medicine

## 2013-04-22 ENCOUNTER — Encounter (HOSPITAL_COMMUNITY)
Admission: RE | Admit: 2013-04-22 | Discharge: 2013-04-22 | Disposition: A | Discharge status: Home / Self Care | Payer: 59 | Source: Ambulatory Visit | Attending: Obstetrics and Gynecology | Admitting: Obstetrics and Gynecology

## 2013-04-22 ENCOUNTER — Ambulatory Visit (INDEPENDENT_AMBULATORY_CARE_PROVIDER_SITE_OTHER): Payer: 59 | Admitting: Internal Medicine

## 2013-04-22 ENCOUNTER — Ambulatory Visit (INDEPENDENT_AMBULATORY_CARE_PROVIDER_SITE_OTHER)
Admission: RE | Admit: 2013-04-22 | Discharge: 2013-04-22 | Disposition: A | Discharge status: Home / Self Care | Payer: 59 | Source: Ambulatory Visit | Attending: Internal Medicine | Admitting: Internal Medicine

## 2013-04-22 VITALS — BP 116/76 | HR 73 | Ht 68.0 in | Wt 180.2 lb

## 2013-04-22 DIAGNOSIS — L509 Urticaria, unspecified: Secondary | ICD-10-CM

## 2013-04-22 DIAGNOSIS — Z87891 Personal history of nicotine dependence: Secondary | ICD-10-CM

## 2013-04-22 DIAGNOSIS — J4520 Mild intermittent asthma, uncomplicated: Secondary | ICD-10-CM

## 2013-04-22 DIAGNOSIS — J309 Allergic rhinitis, unspecified: Secondary | ICD-10-CM

## 2013-04-22 DIAGNOSIS — J3089 Other allergic rhinitis: Secondary | ICD-10-CM

## 2013-04-22 DIAGNOSIS — J45909 Unspecified asthma, uncomplicated: Secondary | ICD-10-CM

## 2013-04-22 DIAGNOSIS — J302 Other seasonal allergic rhinitis: Secondary | ICD-10-CM

## 2013-04-22 LAB — BASIC METABOLIC PANEL
BUN: 15 mg/dL (ref 6–23)
CALCIUM: 8.9 mg/dL (ref 8.4–10.5)
CHLORIDE: 99 meq/L (ref 96–112)
CO2: 27 meq/L (ref 19–32)
Creatinine, Ser: 0.69 mg/dL (ref 0.50–1.10)
GFR calc Af Amer: >90 mL/min (ref >90)
GFR calc non Af Amer: >90 mL/min (ref >90)
Glucose, Bld: 98 mg/dL (ref 70–99)
Potassium: 4.4 mEq/L (ref 3.7–5.3)
Sodium: 139 mEq/L (ref 137–147)

## 2013-04-22 LAB — CBC
HCT: 41.9 % (ref 36.0–46.0)
Hemoglobin: 14.2 g/dL (ref 12.0–15.0)
MCH: 34.8 pg — AB (ref 26.0–34.0)
MCHC: 33.9 g/dL (ref 30.0–36.0)
MCV: 102.7 fL — AB (ref 78.0–100.0)
PLATELETS: 230 10*3/uL (ref 150–400)
RBC: 4.08 MIL/uL (ref 3.87–5.11)
RDW: 13.7 % (ref 11.5–15.5)
WBC: 5.2 10*3/uL (ref 4.0–10.5)

## 2013-04-22 MED ORDER — AZELASTINE-FLUTICASONE 137-50 MCG/ACT NA SUSP: 2.0000 | Freq: Every day | NASAL | Status: DC

## 2013-04-22 MED ORDER — GENTAMICIN SULFATE 40 MG/ML IJ SOLN
INTRAMUSCULAR | Status: AC
  Administered 2013-04-23: 114.7 mL via INTRAVENOUS
  Filled 2013-04-22: qty 8.75

## 2013-04-22 MED ORDER — LACTATED RINGERS IV SOLN: INTRAVENOUS | Status: DC

## 2013-04-22 NOTE — H&P (Signed)
Cynthia Mccullough is an 63 y.o. female presents with postmenopausal bleeding  D and C last year revealed complex hyperplasia and no atypia.   Pertinent Gynecological History: Menses: flow is moderate Bleeding: post menopausal bleeding Contraception: none DES exposure: denies Blood transfusions: none Sexually transmitted diseases: no past history Previous GYN Procedures: DNC  Last mammogram: normal Date: 2014  Last pap: normal Date: 2014 OB History: G2, P2  Menstrual History: Menarche age: unknown  No LMP recorded. Patient is postmenopausal.    Past Medical History  Diagnosis Date  . HTN (hypertension)   . Anxiety   . Depression   . HLD (hyperlipidemia)   . Asthma   . Allergic rhinitis   . Essential tremor     Past Surgical History  Procedure Laterality Date  . Cervical ablation    . Foot surgery    . Dilation and curettage of uterus    . Liposuction      Family History  Problem Relation Age of Onset  . Heart attack Father   . Coronary artery disease Father   . Hypertension Father   . Diabetes Father     Social History:  reports that she quit smoking about 33 years ago. Her smoking use included Cigarettes. She has a 14 pack-year smoking history. She does not have any smokeless tobacco history on file. She reports that she drinks about 1.2 ounces of alcohol per week. She reports that she does not use illicit drugs.  Allergies:  Allergies  Allergen Reactions  . Metronidazole Hives  . Penicillins Anaphylaxis    REACTION: Urticaria/ angioedema  . Sulfonamide Derivatives Hives    REACTION: urticaria  . Amoxicillin Hives    No prescriptions prior to admission    Review of Systems  All other systems reviewed and are negative.   Height 5\' 8"  (1.727 m), weight 79.379 kg (175 lb). Physical Exam  Nursing note and vitals reviewed. Constitutional: She is oriented to person, place, and time. She appears well-developed.  HENT:  Head: Normocephalic.  Eyes:  Pupils are equal, round, and reactive to light.  Neck: Normal range of motion.  Cardiovascular: Normal rate.   Respiratory: Effort normal.  GI: Soft.  Genitourinary: Vagina normal and uterus normal.  Neurological: She is alert and oriented to person, place, and time.  Skin: Skin is warm and dry.    Results for orders placed during the hospital encounter of 04/22/13 (from the past 24 hour(s))  CBC     Status: Abnormal   Collection Time    04/22/13 12:05 PM      Result Value Ref Range   WBC 5.2  4.0 - 10.5 K/uL   RBC 4.08  3.87 - 5.11 MIL/uL   Hemoglobin 14.2  12.0 - 15.0 g/dL   HCT 41.9  36.0 - 46.0 %   MCV 102.7 (*) 78.0 - 100.0 fL   MCH 34.8 (*) 26.0 - 34.0 pg   MCHC 33.9  30.0 - 36.0 g/dL   RDW 13.7  11.5 - 15.5 %   Platelets 230  150 - 400 K/uL  BASIC METABOLIC PANEL     Status: None   Collection Time    04/22/13 12:05 PM      Result Value Ref Range   Sodium 139  137 - 147 mEq/L   Potassium 4.4  3.7 - 5.3 mEq/L   Chloride 99  96 - 112 mEq/L   CO2 27  19 - 32 mEq/L   Glucose, Bld 98  70 -  99 mg/dL   BUN 15  6 - 23 mg/dL   Creatinine, Ser 0.69  0.50 - 1.10 mg/dL   Calcium 8.9  8.4 - 10.5 mg/dL   GFR calc non Af Amer >90  >90 mL/min   GFR calc Af Amer >90  >90 mL/min    Dg Chest 2 View  04/22/2013   CLINICAL DATA:  Ex-smoker, no chest complaints  EXAM: CHEST  2 VIEW  COMPARISON:  CT ABD/PELV WO CM dated 01/15/2013; DG CHEST 2 VIEW dated 03/26/2008  FINDINGS: The heart size and mediastinal contours are within normal limits. Both lungs are clear. The visualized skeletal structures are unremarkable.  IMPRESSION: No active cardiopulmonary disease.   Electronically Signed   By: Kathreen Devoid   On: 04/22/2013 15:00    Assessment/Plan: Postmenopausal bleeding LAVH and BSO  Risks reviewed  Will proceed  Consent signed  Cyril Mourning 04/22/2013, 5:26 PM

## 2013-04-22 NOTE — Patient Instructions (Signed)
Cynthia Mccullough  04/22/2013   Your procedure is scheduled on:  04/23/13  Enter through the Main Entrance of [facility] at [place] up the phone at the desk and dial [phone].   Call this number if you have problems the morning of surgery: [phone]   Remember:   Do not eat food:After Midnight.  Do not drink clear liquids: After Midnight.  Take these medicines the morning of surgery with A SIP OF WATER: blood pressure medication.   Do not wear jewelry, make-up or nail polish.  Do not wear lotions, powders, or perfumes. You may wear deodorant.  Do not shave 48 hours prior to surgery.  Do not bring valuables to the hospital.  [facility] is not   responsible for any belongings or valuables brought to the hospital.  Contacts, dentures or bridgework may not be worn into surgery.  Leave suitcase in the car. After surgery it may be brought to your room.  For patients admitted to the hospital, checkout time is 11:00 AM the day of              discharge.   Patients discharged the day of surgery will not be allowed to drive             home.  Name and phone number of your driver: NA  Special Instructions:      Please read over the following fact sheets that you were given:   Surgical Site Infection Prevention

## 2013-04-22 NOTE — Assessment & Plan Note (Signed)
Very good control. Do not expect a problem for her hysterectomy. Plan-chest x-ray

## 2013-04-22 NOTE — Progress Notes (Signed)
04/22/13- 39 yoF former smoker Former [place] chest patient-saw Dr Gerilyn Nestle, then [place]. Has not had a CXR in about 5 years. Would like to have CXR today as STAT  as she is due for hysterectomy tomorrow. Daughter is here. Dr Melford Aase PCP. She had tonsillitis during the last winter. Perennial nasal congestion, somewhat worse in spring and fall. Zyrtec no longer helps. Nasal discharge is always clear. Denies cough, phlegm or shortness of breath with routine activity. Still has a rescue inhaler used maybe once or twice a year. No routine wheezing. 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 She is a homemaker, living with her husband who has esophageal cancer. She quit smoking many years ago.  Prior to Admission medications   Medication Sig Start Date End Date Taking? Authorizing Provider  ALPRAZolam Duanne Moron) 0.5 MG tablet Take 0.5 mg by mouth daily as needed for anxiety.    Yes Historical Provider, MD  BYSTOLIC 10 MG tablet Take 10 mg by mouth daily. 12/31/12  Yes Historical Provider, MD  cetirizine (ZYRTEC) 10 MG tablet Take 10 mg by mouth daily.   Yes Historical Provider, MD  cloNIDine (CATAPRES) 0.2 MG tablet Take 0.2 mg by mouth daily. 12/25/12  Yes Historical Provider, MD  desvenlafaxine (PRISTIQ) 50 MG 24 hr tablet Take 50 mg by mouth daily.    Yes Historical Provider, MD  famotidine (PEPCID AC) 10 MG tablet Take 10 mg by mouth daily as needed for heartburn or indigestion.   Yes Historical Provider, MD  fenofibrate 160 MG tablet Take 160 mg by mouth at bedtime.    Yes Historical Provider, MD  fish oil-omega-3 fatty acids 1000 MG capsule Take 1 g by mouth daily.    Yes Historical Provider, MD  FLUoxetine (PROZAC) 10 MG capsule Take 10 mg by mouth daily.   Yes Historical Provider, MD  fluticasone (FLONASE) 50 MCG/ACT nasal spray Place 2 sprays into the nose daily.   Yes Historical Provider, MD  ibuprofen (ADVIL,MOTRIN) 200 MG tablet Take 400 mg by mouth every 6 (six) hours as needed for moderate  pain.   Yes Historical Provider, MD  Plant Sterols and Stanols (CHOLESTOFF PLUS) 450 MG CAPS Take 1 capsule by mouth daily.   Yes Historical Provider, MD  valsartan (DIOVAN) 40 MG tablet Take 40 mg by mouth 2 (two) times daily.    Yes Historical Provider, MD  VENTOLIN HFA 108 (90 BASE) MCG/ACT inhaler Inhale 2 puffs into the lungs 4 (four) times daily as needed. 03/07/13  Yes Historical Provider, MD  Azelastine-Fluticasone (DYMISTA) 137-50 MCG/ACT SUSP Place 2 sprays into both nostrils at bedtime. 04/22/13   Deneise Lever, MD   Past Medical History  Diagnosis Date  . HTN (hypertension)   . Anxiety   . Depression   . HLD (hyperlipidemia)   . Asthma   . Allergic rhinitis   . Essential tremor    Past Surgical History  Procedure Laterality Date  . Cervical ablation    . Foot surgery    . Dilation and curettage of uterus    . Liposuction     Family History  Problem Relation Age of Onset  . Heart attack Father   . Coronary artery disease Father   . Hypertension Father   . Diabetes Father    History   Social History  . Marital Status: Married    Spouse Name: N/A    Number of Children: 2  . 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 Smoking status: Former Smoker -- 1.00 packs/day for 14 years    Types: Cigarettes    Quit date: 04/22/1980  . Smokeless tobacco: Not on file  . Alcohol Use: 1.2 oz/week    2 Glasses of wine per week     Comment: white  . Drug Use: No  . Sexual Activity: Not on file   Other Topics Concern  . Not on file   Social History Narrative  . No narrative on file   ROS-see HPI Constitutional:   No-   weight loss, night sweats, fevers, chills, fatigue, lassitude. HEENT:   No-  headaches, difficulty swallowing, tooth/dental problems, sore throat,       No-  sneezing, itching, ear ache, nasal congestion, post nasal drip,  CV:  No-   chest pain, orthopnea, PND, swelling in lower extremities,  anasarca,                                  dizziness, palpitations Resp: No-   shortness of breath with exertion or at rest.              No-   productive cough,  No non-productive cough,  No- coughing up of blood.              No-   change in color of mucus.  No- wheezing.   Skin: No-   rash or lesions. GI:  No-   heartburn, indigestion, abdominal pain, nausea, vomiting, diarrhea,                 change in bowel habits, loss of appetite GU: No-   dysuria, change in color of urine, no urgency or frequency.  No- flank pain. MS:  No-   joint pain or swelling.  No- decreased range of motion.  No- back pain. Neuro-     +chronic tremor Psych:  No- change in mood or affect. No depression or anxiety.  No memory loss.  OBJ- Physical Exam General- Alert, Oriented, Affect-appropriate, Distress- none acute. Medium build Skin- rash-none, lesions- none, excoriation- none Lymphadenopathy- none Head- atraumatic            Eyes- Gross vision intact, PERRLA, conjunctivae and secretions clear            Ears- Hearing, canals-normal            Nose- Clear, no-Septal dev, mucus, polyps, erosion, perforation             Throat- Mallampati II , mucosa clear , drainage- none, tonsils- atrophic Neck- flexible , trachea midline, no stridor , thyroid nl, carotid no bruit Chest - symmetrical excursion , unlabored           Heart/CV- RRR , no murmur , no gallop  , no rub, nl s1 s2                           - JVD- none , edema- none, stasis changes- none, varices- none           Lung- clear to P&A, wheeze- none, cough- none , dullness-none, rub- none           Chest wall-  Abd- tender-no, distended-no, bowel sounds-present, HSM- no Br/ Gen/ Rectal- Not done, not indicated Extrem- cyanosis- none, clubbing, none, atrophy- none, strength- nl Neuro- +Resting tremor/ head bob.

## 2013-04-22 NOTE — Patient Instructions (Signed)
Sample Dymista nasal spray   1-2 puffs each nostril once daily at bedtime while needed. Try this instead of flonase. Try it instead of Zyrtec at first. You can add back Zyrtec later if you want.  Order- CXR    Dx hx tobacco use, asthma

## 2013-04-23 ENCOUNTER — Encounter (HOSPITAL_COMMUNITY): Payer: 59 | Admitting: Anesthesiology

## 2013-04-23 ENCOUNTER — Observation Stay (HOSPITAL_COMMUNITY)
Admission: RE | Admit: 2013-04-23 | Discharge: 2013-04-24 | Disposition: A | Discharge status: Home / Self Care | Payer: 59 | Source: Ambulatory Visit | Attending: Obstetrics and Gynecology | Admitting: Obstetrics and Gynecology

## 2013-04-23 ENCOUNTER — Encounter (HOSPITAL_COMMUNITY)
Admission: RE | Disposition: A | Discharge status: Home / Self Care | Payer: Self-pay | Source: Ambulatory Visit | Attending: Obstetrics and Gynecology

## 2013-04-23 ENCOUNTER — Ambulatory Visit (HOSPITAL_COMMUNITY): Payer: 59 | Admitting: Anesthesiology

## 2013-04-23 ENCOUNTER — Encounter (HOSPITAL_COMMUNITY): Payer: Self-pay | Admitting: Anesthesiology

## 2013-04-23 DIAGNOSIS — Z90710 Acquired absence of both cervix and uterus: Secondary | ICD-10-CM | POA: Diagnosis present

## 2013-04-23 DIAGNOSIS — Z880 Allergy status to penicillin: Secondary | ICD-10-CM | POA: Insufficient documentation

## 2013-04-23 DIAGNOSIS — E785 Hyperlipidemia, unspecified: Secondary | ICD-10-CM | POA: Insufficient documentation

## 2013-04-23 DIAGNOSIS — F341 Dysthymic disorder: Secondary | ICD-10-CM | POA: Insufficient documentation

## 2013-04-23 DIAGNOSIS — N8000 Endometriosis of the uterus, unspecified: Secondary | ICD-10-CM | POA: Insufficient documentation

## 2013-04-23 DIAGNOSIS — J45909 Unspecified asthma, uncomplicated: Secondary | ICD-10-CM | POA: Insufficient documentation

## 2013-04-23 DIAGNOSIS — R259 Unspecified abnormal involuntary movements: Secondary | ICD-10-CM | POA: Insufficient documentation

## 2013-04-23 DIAGNOSIS — J302 Other seasonal allergic rhinitis: Secondary | ICD-10-CM

## 2013-04-23 DIAGNOSIS — D252 Subserosal leiomyoma of uterus: Secondary | ICD-10-CM | POA: Insufficient documentation

## 2013-04-23 DIAGNOSIS — H1045 Other chronic allergic conjunctivitis: Secondary | ICD-10-CM

## 2013-04-23 DIAGNOSIS — J4520 Mild intermittent asthma, uncomplicated: Secondary | ICD-10-CM

## 2013-04-23 DIAGNOSIS — Z87891 Personal history of nicotine dependence: Secondary | ICD-10-CM | POA: Insufficient documentation

## 2013-04-23 DIAGNOSIS — J3089 Other allergic rhinitis: Secondary | ICD-10-CM

## 2013-04-23 DIAGNOSIS — L509 Urticaria, unspecified: Secondary | ICD-10-CM

## 2013-04-23 DIAGNOSIS — I10 Essential (primary) hypertension: Secondary | ICD-10-CM | POA: Insufficient documentation

## 2013-04-23 DIAGNOSIS — N950 Postmenopausal bleeding: Principal | ICD-10-CM | POA: Insufficient documentation

## 2013-04-23 HISTORY — PX: LAPAROSCOPIC ASSISTED VAGINAL HYSTERECTOMY: SHX5398

## 2013-04-23 HISTORY — PX: SALPINGOOPHORECTOMY: SHX82

## 2013-04-23 SURGERY — HYSTERECTOMY, VAGINAL, LAPAROSCOPY-ASSISTED
Anesthesia: General | Site: Abdomen

## 2013-04-23 MED ORDER — DEXTROSE IN LACTATED RINGERS 5 % IV SOLN
INTRAVENOUS | Status: DC
  Administered 2013-04-23 (×2): via INTRAVENOUS

## 2013-04-23 MED ORDER — BUPIVACAINE HCL (PF) 0.25 % IJ SOLN
INTRAMUSCULAR | Status: DC | PRN
  Administered 2013-04-23: 30 mL

## 2013-04-23 MED ORDER — IRBESARTAN 75 MG PO TABS
75.0000 mg | ORAL_TABLET | Freq: Every day | ORAL | Status: DC
  Filled 2013-04-23: qty 1

## 2013-04-23 MED ORDER — FENTANYL CITRATE 0.05 MG/ML IJ SOLN
INTRAMUSCULAR | Status: DC | PRN
  Administered 2013-04-23 (×7): 50 ug via INTRAVENOUS

## 2013-04-23 MED ORDER — FENTANYL CITRATE 0.05 MG/ML IJ SOLN
INTRAMUSCULAR | Status: AC
  Filled 2013-04-23: qty 2

## 2013-04-23 MED ORDER — SODIUM CHLORIDE 0.9 % IJ SOLN: 9.0000 mL | INTRAMUSCULAR | Status: DC | PRN

## 2013-04-23 MED ORDER — LACTATED RINGERS IV SOLN
INTRAVENOUS | Status: DC
  Administered 2013-04-23: 07:00:00 via INTRAVENOUS

## 2013-04-23 MED ORDER — NEOSTIGMINE METHYLSULFATE 1 MG/ML IJ SOLN
INTRAMUSCULAR | Status: AC
  Filled 2013-04-23: qty 1

## 2013-04-23 MED ORDER — ESTRADIOL 0.1 MG/GM VA CREA
TOPICAL_CREAM | VAGINAL | Status: AC
  Filled 2013-04-23: qty 42.5

## 2013-04-23 MED ORDER — NALOXONE HCL 0.4 MG/ML IJ SOLN: 0.4000 mg | INTRAMUSCULAR | Status: DC | PRN

## 2013-04-23 MED ORDER — DEXAMETHASONE SODIUM PHOSPHATE 10 MG/ML IJ SOLN
INTRAMUSCULAR | Status: AC
  Filled 2013-04-23: qty 1

## 2013-04-23 MED ORDER — MEPERIDINE HCL 25 MG/ML IJ SOLN
6.2500 mg | INTRAMUSCULAR | Status: AC
  Administered 2013-04-23 (×2): 6.25 mg via INTRAVENOUS

## 2013-04-23 MED ORDER — ALPRAZOLAM 0.5 MG PO TABS
0.5000 mg | ORAL_TABLET | Freq: Every day | ORAL | Status: DC | PRN
  Administered 2013-04-23: 0.5 mg via ORAL
  Filled 2013-04-23: qty 1

## 2013-04-23 MED ORDER — 0.9 % SODIUM CHLORIDE (POUR BTL) OPTIME
TOPICAL | Status: DC | PRN
  Administered 2013-04-23: 1000 mL

## 2013-04-23 MED ORDER — ROCURONIUM BROMIDE 100 MG/10ML IV SOLN
INTRAVENOUS | Status: DC | PRN
  Administered 2013-04-23: 30 mg via INTRAVENOUS
  Administered 2013-04-23: 10 mg via INTRAVENOUS

## 2013-04-23 MED ORDER — FENTANYL CITRATE 0.05 MG/ML IJ SOLN
INTRAMUSCULAR | Status: AC
  Filled 2013-04-23: qty 5

## 2013-04-23 MED ORDER — MIDAZOLAM HCL 2 MG/2ML IJ SOLN
INTRAMUSCULAR | Status: DC | PRN
  Administered 2013-04-23: 1 mg via INTRAVENOUS

## 2013-04-23 MED ORDER — HYDROMORPHONE 0.3 MG/ML IV SOLN
INTRAVENOUS | Status: DC
  Administered 2013-04-23: 2.67 mL via INTRAVENOUS
  Administered 2013-04-23: 3.33 mL via INTRAVENOUS
  Administered 2013-04-23: 11:00:00 via INTRAVENOUS
  Administered 2013-04-23: 0.8 mg via INTRAVENOUS
  Administered 2013-04-23: 1.79 mg via INTRAVENOUS
  Filled 2013-04-23: qty 25

## 2013-04-23 MED ORDER — SODIUM CHLORIDE 0.9 % IJ SOLN
INTRAMUSCULAR | Status: DC | PRN
  Administered 2013-04-23: 10 mL

## 2013-04-23 MED ORDER — MENTHOL 3 MG MT LOZG: 1.0000 | LOZENGE | OROMUCOSAL | Status: DC | PRN

## 2013-04-23 MED ORDER — NEOSTIGMINE METHYLSULFATE 1 MG/ML IJ SOLN
INTRAMUSCULAR | Status: DC | PRN
  Administered 2013-04-23: 3 mg via INTRAVENOUS

## 2013-04-23 MED ORDER — TRAMADOL HCL 50 MG PO TABS
50.0000 mg | ORAL_TABLET | Freq: Four times a day (QID) | ORAL | Status: DC | PRN
  Administered 2013-04-24 (×2): 50 mg via ORAL
  Filled 2013-04-23 (×2): qty 1

## 2013-04-23 MED ORDER — CLONIDINE HCL 0.2 MG PO TABS
0.2000 mg | ORAL_TABLET | Freq: Every day | ORAL | Status: DC
  Filled 2013-04-23 (×2): qty 1

## 2013-04-23 MED ORDER — DIPHENHYDRAMINE HCL 12.5 MG/5ML PO ELIX
12.5000 mg | ORAL_SOLUTION | Freq: Four times a day (QID) | ORAL | Status: DC | PRN
  Administered 2013-04-23: 12.5 mg via ORAL
  Filled 2013-04-23 (×3): qty 5

## 2013-04-23 MED ORDER — ALBUTEROL SULFATE (2.5 MG/3ML) 0.083% IN NEBU: 3.0000 mL | INHALATION_SOLUTION | RESPIRATORY_TRACT | Status: DC | PRN

## 2013-04-23 MED ORDER — GLYCOPYRROLATE 0.2 MG/ML IJ SOLN
INTRAMUSCULAR | Status: AC
  Filled 2013-04-23: qty 3

## 2013-04-23 MED ORDER — LIDOCAINE HCL (CARDIAC) 20 MG/ML IV SOLN
INTRAVENOUS | Status: DC | PRN
  Administered 2013-04-23: 80 mg via INTRAVENOUS
  Administered 2013-04-23: 20 mg via INTRAVENOUS

## 2013-04-23 MED ORDER — GLYCOPYRROLATE 0.2 MG/ML IJ SOLN
INTRAMUSCULAR | Status: DC | PRN
  Administered 2013-04-23: .6 mg via INTRAVENOUS

## 2013-04-23 MED ORDER — PROPOFOL 10 MG/ML IV BOLUS
INTRAVENOUS | Status: DC | PRN
  Administered 2013-04-23: 200 mg via INTRAVENOUS

## 2013-04-23 MED ORDER — MEPERIDINE HCL 25 MG/ML IJ SOLN
INTRAMUSCULAR | Status: AC
  Filled 2013-04-23: qty 1

## 2013-04-23 MED ORDER — ONDANSETRON HCL 4 MG/2ML IJ SOLN
INTRAMUSCULAR | Status: AC
  Filled 2013-04-23: qty 2

## 2013-04-23 MED ORDER — DEXAMETHASONE SODIUM PHOSPHATE 4 MG/ML IJ SOLN
INTRAMUSCULAR | Status: DC | PRN
  Administered 2013-04-23: 8 mg via INTRAVENOUS

## 2013-04-23 MED ORDER — LACTATED RINGERS IV SOLN
INTRAVENOUS | Status: DC
  Administered 2013-04-23 (×3): via INTRAVENOUS

## 2013-04-23 MED ORDER — SODIUM CHLORIDE 0.9 % IJ SOLN
INTRAMUSCULAR | Status: AC
  Filled 2013-04-23: qty 10

## 2013-04-23 MED ORDER — ONDANSETRON HCL 4 MG/2ML IJ SOLN
INTRAMUSCULAR | Status: DC | PRN
  Administered 2013-04-23: 4 mg via INTRAVENOUS

## 2013-04-23 MED ORDER — EPHEDRINE SULFATE 50 MG/ML IJ SOLN
INTRAMUSCULAR | Status: DC | PRN
  Administered 2013-04-23 (×3): 5 mg via INTRAVENOUS

## 2013-04-23 MED ORDER — ONDANSETRON HCL 4 MG/2ML IJ SOLN
4.0000 mg | Freq: Four times a day (QID) | INTRAMUSCULAR | Status: DC | PRN
  Administered 2013-04-23: 4 mg via INTRAVENOUS
  Filled 2013-04-23: qty 2

## 2013-04-23 MED ORDER — ROCURONIUM BROMIDE 100 MG/10ML IV SOLN
INTRAVENOUS | Status: AC
  Filled 2013-04-23: qty 1

## 2013-04-23 MED ORDER — FLUOXETINE HCL 10 MG PO CAPS
10.0000 mg | ORAL_CAPSULE | Freq: Every day | ORAL | Status: DC
  Filled 2013-04-23: qty 1

## 2013-04-23 MED ORDER — NEBIVOLOL HCL 10 MG PO TABS
10.0000 mg | ORAL_TABLET | Freq: Every day | ORAL | Status: DC
  Administered 2013-04-23: 10 mg via ORAL
  Filled 2013-04-23 (×2): qty 1

## 2013-04-23 MED ORDER — LIDOCAINE HCL (CARDIAC) 20 MG/ML IV SOLN
INTRAVENOUS | Status: AC
  Filled 2013-04-23: qty 5

## 2013-04-23 MED ORDER — DIPHENHYDRAMINE HCL 50 MG/ML IJ SOLN: 12.5000 mg | Freq: Four times a day (QID) | INTRAMUSCULAR | Status: DC | PRN

## 2013-04-23 MED ORDER — AZELASTINE-FLUTICASONE 137-50 MCG/ACT NA SUSP: 2.0000 | Freq: Every day | NASAL | Status: DC

## 2013-04-23 MED ORDER — HYDROMORPHONE HCL PF 1 MG/ML IJ SOLN
INTRAMUSCULAR | Status: AC
  Filled 2013-04-23: qty 1

## 2013-04-23 MED ORDER — HYDROMORPHONE HCL PF 1 MG/ML IJ SOLN
0.2500 mg | INTRAMUSCULAR | Status: DC | PRN
  Administered 2013-04-23 (×4): 0.5 mg via INTRAVENOUS

## 2013-04-23 MED ORDER — MIDAZOLAM HCL 2 MG/2ML IJ SOLN
INTRAMUSCULAR | Status: AC
  Filled 2013-04-23: qty 2

## 2013-04-23 MED ORDER — PROPOFOL 10 MG/ML IV EMUL
INTRAVENOUS | Status: AC
  Filled 2013-04-23: qty 20

## 2013-04-23 MED ORDER — FAMOTIDINE 20 MG PO TABS
10.0000 mg | ORAL_TABLET | Freq: Every day | ORAL | Status: DC | PRN
  Filled 2013-04-23: qty 1

## 2013-04-23 MED ORDER — IBUPROFEN 600 MG PO TABS
600.0000 mg | ORAL_TABLET | Freq: Four times a day (QID) | ORAL | Status: DC | PRN
  Administered 2013-04-23 – 2013-04-24 (×2): 600 mg via ORAL
  Filled 2013-04-23 (×2): qty 1

## 2013-04-23 MED ORDER — HYDROMORPHONE HCL PF 1 MG/ML IJ SOLN
INTRAMUSCULAR | Status: AC
Start: 2013-04-23 — End: 2013-04-23
  Filled 2013-04-23: qty 1

## 2013-04-23 MED ORDER — VENLAFAXINE HCL ER 75 MG PO CP24
75.0000 mg | ORAL_CAPSULE | Freq: Every day | ORAL | Status: DC
  Filled 2013-04-23: qty 1

## 2013-04-23 MED ORDER — BUPIVACAINE HCL (PF) 0.25 % IJ SOLN
INTRAMUSCULAR | Status: AC
  Filled 2013-04-23: qty 30

## 2013-04-23 SURGICAL SUPPLY — 40 items
ADH SKN CLS APL DERMABOND .7 (GAUZE/BANDAGES/DRESSINGS) ×1
BARRIER ADHS 3X4 INTERCEED (GAUZE/BANDAGES/DRESSINGS) IMPLANT
BRR ADH 4X3 ABS CNTRL BYND (GAUZE/BANDAGES/DRESSINGS)
CABLE HIGH FREQUENCY MONO STRZ (ELECTRODE) IMPLANT
CHLORAPREP W/TINT 26ML (MISCELLANEOUS) ×3 IMPLANT
CLOTH BEACON ORANGE TIMEOUT ST (SAFETY) ×3 IMPLANT
CONT PATH 16OZ SNAP LID 3702 (MISCELLANEOUS) ×3 IMPLANT
COVER TABLE BACK 60X90 (DRAPES) ×3 IMPLANT
DECANTER SPIKE VIAL GLASS SM (MISCELLANEOUS) ×6 IMPLANT
DERMABOND ADVANCED (GAUZE/BANDAGES/DRESSINGS) ×1
DERMABOND ADVANCED .7 DNX12 (GAUZE/BANDAGES/DRESSINGS) ×2 IMPLANT
ELECT REM PT RETURN 9FT ADLT (ELECTROSURGICAL) ×3
ELECTRODE REM PT RTRN 9FT ADLT (ELECTROSURGICAL) ×2 IMPLANT
EVACUATOR SMOKE 8.L (FILTER) IMPLANT
GLOVE BIO SURGEON STRL SZ 6.5 (GLOVE) ×3 IMPLANT
GLOVE BIOGEL PI IND STRL 6.5 (GLOVE) ×2 IMPLANT
GLOVE BIOGEL PI INDICATOR 6.5 (GLOVE) ×1
GOWN STRL REUS W/TWL LRG LVL3 (GOWN DISPOSABLE) ×12 IMPLANT
HEMOSTAT SURGICEL 4X8 (HEMOSTASIS) ×3 IMPLANT
NEEDLE INSUFFLATION 120MM (ENDOMECHANICALS) ×3 IMPLANT
NS IRRIG 1000ML POUR BTL (IV SOLUTION) ×3 IMPLANT
PACK LAVH (CUSTOM PROCEDURE TRAY) ×3 IMPLANT
PROTECTOR NERVE ULNAR (MISCELLANEOUS) ×3 IMPLANT
SEALER TISSUE G2 CVD JAW 45CM (ENDOMECHANICALS) ×3 IMPLANT
SET IRRIG TUBING LAPAROSCOPIC (IRRIGATION / IRRIGATOR) ×3 IMPLANT
SOLUTION ELECTROLUBE (MISCELLANEOUS) IMPLANT
SUT VIC AB 0 CT1 18XCR BRD8 (SUTURE) ×4 IMPLANT
SUT VIC AB 0 CT1 36 (SUTURE) ×9 IMPLANT
SUT VIC AB 0 CT1 8-18 (SUTURE) ×4
SUT VIC AB 3-0 PS2 18 (SUTURE)
SUT VIC AB 3-0 PS2 18XBRD (SUTURE) IMPLANT
SUT VICRYL 0 TIES 12 18 (SUTURE) ×3 IMPLANT
SUT VICRYL 0 UR6 27IN ABS (SUTURE) ×3 IMPLANT
SYR 20CC LL (SYRINGE) ×3 IMPLANT
TOWEL OR 17X24 6PK STRL BLUE (TOWEL DISPOSABLE) ×9 IMPLANT
TRAY FOLEY CATH 14FR (SET/KITS/TRAYS/PACK) ×3 IMPLANT
TROCAR OPTI TIP 5M 100M (ENDOMECHANICALS) ×3 IMPLANT
TROCAR XCEL DIL TIP R 11M (ENDOMECHANICALS) ×3 IMPLANT
WARMER LAPAROSCOPE (MISCELLANEOUS) ×3 IMPLANT
WATER STERILE IRR 1000ML POUR (IV SOLUTION) IMPLANT

## 2013-04-23 NOTE — Anesthesia Postprocedure Evaluation (Signed)
Anesthesia Post Note  Patient: Cynthia Mccullough [facility]  Procedure(s) Performed: Procedure(s) (LRB): LAPAROSCOPIC ASSISTED VAGINAL HYSTERECTOMY (N/A) SALPINGO OOPHORECTOMY (Bilateral)  Anesthesia type: General  Patient location: Women's Unit  Post pain: Pain level controlled  Post assessment: Post-op Vital signs reviewed  Last Vitals:  Filed Vitals:   04/23/13 1728  BP: 113/63  Pulse: 66  Temp: 36.3 C  Resp: 10    Post vital signs: Reviewed  Level of consciousness: sedated  Complications: No apparent anesthesia complications

## 2013-04-23 NOTE — Progress Notes (Signed)
H and P on the chart No significant changes Will proceed with LAVH and BSO Consent signed 

## 2013-04-23 NOTE — Anesthesia Preprocedure Evaluation (Addendum)
Anesthesia Evaluation  Patient identified by MRN, date of birth, ID band Patient awake    Reviewed: Allergy & Precautions, H&P , Patient's Chart, lab work & pertinent test results, reviewed documented beta blocker date and time   Airway Mallampati: II TM Distance: >3 FB Neck ROM: full    Dental no notable dental hx. (+)    Pulmonary former smoker,  breath sounds clear to auscultation  Pulmonary exam normal       Cardiovascular hypertension, On Home Beta Blockers and On Medications Rhythm:regular Rate:Normal     Neuro/Psych    GI/Hepatic   Endo/Other    Renal/GU      Musculoskeletal   Abdominal   Peds  Hematology   Anesthesia Other Findings No CAD sx, EKG fine from 3/15 Surgery cancelled from 3/15- Pt had tonsillitis Pt has full upper veneers Clonidine for Hyperhidrosis   Reproductive/Obstetrics                          Anesthesia Physical Anesthesia Plan  ASA: II  Anesthesia Plan: General   Post-op Pain Management:    Induction: Intravenous  Airway Management Planned: Oral ETT  Additional Equipment:   Intra-op Plan:   Post-operative Plan: Extubation in OR  Informed Consent: I have reviewed the patients History and Physical, chart, labs and discussed the procedure including the risks, benefits and alternatives for the proposed anesthesia with the patient or authorized representative who has indicated his/her understanding and acceptance.   Dental Advisory Given and Dental advisory given  Plan Discussed with: CRNA and Surgeon  Anesthesia Plan Comments: (  Discussed general anesthesia, including possible nausea, instrumentation of airway, sore throat,pulmonary aspiration, etc. I asked if the were any outstanding questions, or  concerns before we proceeded. )        Anesthesia Quick Evaluation

## 2013-04-23 NOTE — Brief Op Note (Signed)
04/23/2013  9:13 AM  PATIENT:  Cynthia Mccullough  63 y.o. female  PRE-OPERATIVE DIAGNOSIS:  Post Menopausal Bleeding  POST-OPERATIVE DIAGNOSIS:  Post Menopausal Bleeding  PROCEDURE:  Procedure(s): LAPAROSCOPIC ASSISTED VAGINAL HYSTERECTOMY (N/A) SALPINGO OOPHORECTOMY (Bilateral)  SURGEON:  Surgeon(s) and Role:    * Cyril Mourning, MD - Primary    * Linda Hedges, DO - Assisting  PHYSICIAN ASSISTANT:   ASSISTANTS: none   ANESTHESIA:   general  EBL:  Total I/O In: 2000 [I.V.:2000] Out: 300 [Urine:150; Blood:150]  BLOOD ADMINISTERED:none  DRAINS: Urinary Catheter (Foley)   LOCAL MEDICATIONS USED:  MARCAINE     SPECIMEN:  Source of Specimen:  uterus, cervix, tubes and ovaries  DISPOSITION OF SPECIMEN:  PATHOLOGY  COUNTS:  YES  TOURNIQUET:  * No tourniquets in log *  DICTATION: .Other Dictation: Dictation Number [id number]  PLAN OF CARE: Admit for overnight observation  PATIENT DISPOSITION:  PACU - hemodynamically stable.   Delay start of Pharmacological VTE agent (>24hrs) due to surgical blood loss or risk of bleeding: not applicable

## 2013-04-23 NOTE — Addendum Note (Signed)
Addendum created 04/23/13 1838 by Asher Muir, CRNA   Modules edited: Notes Section   Notes Section:  File: [id number]

## 2013-04-23 NOTE — Transfer of Care (Signed)
Immediate Anesthesia Transfer of Care Note  Patient: Cynthia Mccullough [facility]  Procedure(s) Performed: Procedure(s): LAPAROSCOPIC ASSISTED VAGINAL HYSTERECTOMY (N/A) SALPINGO OOPHORECTOMY (Bilateral)  Patient Location: PACU  Anesthesia Type:General  Level of Consciousness: awake, alert  and oriented  Airway & Oxygen Therapy: Patient Spontanous Breathing and Patient connected to nasal cannula oxygen  Post-op Assessment: Report given to PACU RN, Post -op Vital signs reviewed and stable and Patient moving all extremities  Post vital signs: Reviewed and stable  Complications: No apparent anesthesia complications

## 2013-04-23 NOTE — Anesthesia Postprocedure Evaluation (Signed)
Anesthesia Post Note  Patient: Cynthia Mccullough [facility]  Procedure(s) Performed: Procedure(s) (LRB): LAPAROSCOPIC ASSISTED VAGINAL HYSTERECTOMY (N/A) SALPINGO OOPHORECTOMY (Bilateral)  Anesthesia type: General  Patient location: PACU  Post pain: Pain level controlled  Post assessment: Post-op Vital signs reviewed  Last Vitals:  Filed Vitals:   04/23/13 0930  BP: 127/72  Pulse: 78  Temp:   Resp: 25    Post vital signs: Reviewed  Level of consciousness: sedated  Complications: No apparent anesthesia complications

## 2013-04-23 NOTE — Op Note (Signed)
Cynthia Mccullough, Cynthia Mccullough NO.:  [id number]  MEDICAL RECORD NO.:  [id number]  LOCATION:  [facility]                          FACILITY:  [place]  PHYSICIAN:  Kashius Dominic L. Marlon Vonruden, M.D.DATE OF BIRTH:  Feb 15, 1950  DATE OF PROCEDURE:  04/23/2013 DATE OF DISCHARGE:                              OPERATIVE REPORT   PREOPERATIVE DIAGNOSIS:  Postmenopausal bleeding.  POSTOPERATIVE DIAGNOSIS:  Postmenopausal bleeding.  PROCEDURE:  Laparoscopic-assisted vaginal hysterectomy and bilateral salpingo oophorectomy.  SURGEON:  Jasen Hartstein L. Helane Rima, M.D.  ASSISTANT:  Dr. Lynnette Caffey.  ANESTHESIA:  General.  EBL:  150 mL.  URINE OUTPUT:  150 mL of clear urine.  DRAINS:  Foley catheter.  PROCEDURES:  The patient was consented and went to the operating room. She was intubated, she was prepped and draped in usual sterile fashion. A Foley catheter was inserted.  The uterine manipulator was inserted.  A small infraumbilical incision was made.  The Veress needle was inserted. Pneumoperitoneum was performed.  The trocar was inserted.  Laparoscope was introduced through the trocar sheath.  The patient was placed in Trendelenburg position.  A 5-mm suprapubic trocar was placed suprapubically.  Her pelvis was inspected.  Her uterus appeared normal. There was 1 small subserosal fibroid.  The ovaries were unremarkable. The fallopian tubes had the small well circumscribed cystic lesions on both fallopian tubes and there was one on her left ovary.  My concern is this could be some type of carcinoma, however, there was no peritoneal studding, and there was no endometriosis, and there were no adhesions. The ovaries were of normal size.  I proceeded with LAVH BSO placing Enseal across the right IP ligament, burning it, cauterizing it, and carried it down to the round ligament on the right.  This was done on the left side in similar fashion.  Hemostasis was excellent.  The attention was turned to the vagina.   Weighted speculum was placed in the vagina.  The circumferential incision was made around the cervix.  A series of clamps were used to clamp the uterosacral cardinal ligament complexes and carrying it all the way up to the broad ligament, and each pedicle was clamped, cut, and suture ligated using 0-Vicryl suture. Once we reached the level of the fundus, uterus was retroflexed and the specimen was removed.  It was identified the cervix, uterus, tubes, and ovaries.  It was sent for Pathology.  The vaginal cuff was closed posteriorly in a running locked fashion using 0-Vicryl and the cuff was closed completely anterior to posterior in a running locked fashion. Hemostasis was very good while we were closing the cuff.  We went back up to the abdomen and re-insufflated and irrigated the pelvis, all pedicles were inspected and noted to be hemostatic.  Surgicel was placed at the vaginal cuff to hopefully minimize any adhesion formation or any oozing postoperatively.  I also injected some Marcaine in the peritoneum to help with postop pain.  At the end of the procedure, the insufflation was released.  The trocars were removed.  The incisions were closed with Dermabond.  All sponge, lap, and instrument counts were correct x2.  The patient went to the recovery room in stable condition.  [name], M.D.     Cynthia Mccullough  D:  04/23/2013  T:  04/23/2013  Job:  [id number]

## 2013-04-23 NOTE — Addendum Note (Signed)
Addendum created 04/23/13 0959 by Georgeanne Nim, CRNA   Modules edited: Anesthesia Flowsheet

## 2013-04-24 ENCOUNTER — Encounter (HOSPITAL_COMMUNITY): Payer: Self-pay | Admitting: Obstetrics and Gynecology

## 2013-04-24 LAB — CBC
HCT: 36.8 % (ref 36.0–46.0)
Hemoglobin: 12.2 g/dL (ref 12.0–15.0)
MCH: 34.2 pg — ABNORMAL HIGH (ref 26.0–34.0)
MCHC: 33.2 g/dL (ref 30.0–36.0)
MCV: 103.1 fL — ABNORMAL HIGH (ref 78.0–100.0)
Platelets: 183 10*3/uL (ref 150–400)
RBC: 3.57 MIL/uL — ABNORMAL LOW (ref 3.87–5.11)
RDW: 13.6 % (ref 11.5–15.5)
WBC: 5.6 10*3/uL (ref 4.0–10.5)

## 2013-04-24 LAB — BASIC METABOLIC PANEL
BUN: 8 mg/dL (ref 6–23)
CO2: 27 mEq/L (ref 19–32)
CREATININE: 0.68 mg/dL (ref 0.50–1.10)
Calcium: 8.3 mg/dL — ABNORMAL LOW (ref 8.4–10.5)
Chloride: 98 mEq/L (ref 96–112)
GFR calc non Af Amer: >90 mL/min (ref >90)
Glucose, Bld: 130 mg/dL — ABNORMAL HIGH (ref 70–99)
Potassium: 4.1 mEq/L (ref 3.7–5.3)
Sodium: 136 mEq/L — ABNORMAL LOW (ref 137–147)

## 2013-04-24 MED ORDER — IBUPROFEN 600 MG PO TABS: 600.0000 mg | ORAL_TABLET | Freq: Four times a day (QID) | ORAL | Status: DC | PRN

## 2013-04-24 MED ORDER — TRAMADOL HCL 50 MG PO TABS: 50.0000 mg | ORAL_TABLET | Freq: Four times a day (QID) | ORAL | Status: DC | PRN

## 2013-04-24 MED ORDER — OXYCODONE-ACETAMINOPHEN 5-325 MG PO TABS: 1.0000 | ORAL_TABLET | ORAL | Status: DC | PRN

## 2013-04-24 NOTE — Discharge Summary (Signed)
Admission Diagnosis: Postmenopausal bleeding  Discharge Diagnosis: Same  Hospital Course: 63 year old female with postmenopausal bleeding. She underwent LAVH and BSO - pathology pending. She did very well post op and was ambulating, voiding and tolerating her Ibuprofen. She went home on POD #1 in good condition. No driving for 1 week Discharge precautions. Rx Ibuprofen and Ultram

## 2013-04-24 NOTE — Progress Notes (Signed)
Pt states she will take home meds  At home

## 2013-04-24 NOTE — Progress Notes (Signed)
Pt ambulated out teaching complete  

## 2013-04-24 NOTE — Progress Notes (Signed)
1 Day Post-Op Procedure(s) (LRB): LAPAROSCOPIC ASSISTED VAGINAL HYSTERECTOMY (N/A) SALPINGO OOPHORECTOMY (Bilateral)  Subjective: Patient reports tolerating PO, + flatus and no problems voiding.    Objective: I have reviewed patient's vital signs, intake and output, medications and labs.  General: alert, cooperative and appears stated age Vaginal Bleeding: none abdomen is soft and non tender  Assessment: s/p Procedure(s): LAPAROSCOPIC ASSISTED VAGINAL HYSTERECTOMY (N/A) SALPINGO OOPHORECTOMY (Bilateral): stable and progressing well  Plan: Advance diet Discharge home  LOS: 1 day    Cyril Mourning 04/24/2013, 8:41 AM

## 2013-05-12 ENCOUNTER — Telehealth: Payer: Self-pay | Admitting: Internal Medicine

## 2013-05-12 MED ORDER — AZELASTINE-FLUTICASONE 137-50 MCG/ACT NA SUSP: 2.0000 | Freq: Every day | NASAL | Status: DC

## 2013-05-12 NOTE — Telephone Encounter (Signed)
Rx for Dymista was sent  Spoke with the pt and notified that this was done She verbalized understanding and nothing further needed

## 2013-05-16 ENCOUNTER — Telehealth: Payer: Self-pay | Admitting: Internal Medicine

## 2013-05-16 MED ORDER — AZELASTINE-FLUTICASONE 137-50 MCG/ACT NA SUSP: 2.0000 | Freq: Every day | NASAL | Status: DC

## 2013-05-16 NOTE — Telephone Encounter (Signed)
RX has been sent in for dymista to [facility]. Nothing further needed

## 2013-09-10 ENCOUNTER — Encounter (HOSPITAL_BASED_OUTPATIENT_CLINIC_OR_DEPARTMENT_OTHER): Payer: Self-pay | Admitting: Emergency Medicine

## 2013-09-10 ENCOUNTER — Emergency Department (HOSPITAL_BASED_OUTPATIENT_CLINIC_OR_DEPARTMENT_OTHER)
Admission: EM | Admit: 2013-09-10 | Discharge: 2013-09-11 | Disposition: A | Discharge status: Home / Self Care | Payer: 59 | Attending: Emergency Medicine | Admitting: Emergency Medicine

## 2013-09-10 DIAGNOSIS — Z79899 Other long term (current) drug therapy: Secondary | ICD-10-CM | POA: Insufficient documentation

## 2013-09-10 DIAGNOSIS — N201 Calculus of ureter: Secondary | ICD-10-CM

## 2013-09-10 DIAGNOSIS — F329 Major depressive disorder, single episode, unspecified: Secondary | ICD-10-CM | POA: Diagnosis not present

## 2013-09-10 DIAGNOSIS — F3289 Other specified depressive episodes: Secondary | ICD-10-CM | POA: Diagnosis not present

## 2013-09-10 DIAGNOSIS — J45909 Unspecified asthma, uncomplicated: Secondary | ICD-10-CM | POA: Diagnosis not present

## 2013-09-10 DIAGNOSIS — Z880 Allergy status to penicillin: Secondary | ICD-10-CM | POA: Insufficient documentation

## 2013-09-10 DIAGNOSIS — IMO0002 Reserved for concepts with insufficient information to code with codable children: Secondary | ICD-10-CM | POA: Diagnosis not present

## 2013-09-10 DIAGNOSIS — R109 Unspecified abdominal pain: Secondary | ICD-10-CM | POA: Insufficient documentation

## 2013-09-10 DIAGNOSIS — E785 Hyperlipidemia, unspecified: Secondary | ICD-10-CM | POA: Insufficient documentation

## 2013-09-10 DIAGNOSIS — F411 Generalized anxiety disorder: Secondary | ICD-10-CM | POA: Insufficient documentation

## 2013-09-10 DIAGNOSIS — Z8669 Personal history of other diseases of the nervous system and sense organs: Secondary | ICD-10-CM | POA: Diagnosis not present

## 2013-09-10 DIAGNOSIS — Z87891 Personal history of nicotine dependence: Secondary | ICD-10-CM | POA: Insufficient documentation

## 2013-09-10 DIAGNOSIS — I10 Essential (primary) hypertension: Secondary | ICD-10-CM | POA: Diagnosis not present

## 2013-09-10 HISTORY — DX: Calculus of kidney: N20.0

## 2013-09-10 NOTE — ED Notes (Signed)
PT presents to ED with complaints of left lower abdominal pain and left lower back pain. Pt has history of kidney stones and was told she has still has stones in left kidney.

## 2013-09-11 LAB — COMPREHENSIVE METABOLIC PANEL
ALK PHOS: 39 U/L (ref 39–117)
ALT: 20 U/L (ref 0–35)
AST: 19 U/L (ref 0–37)
Albumin: 3.2 g/dL — ABNORMAL LOW (ref 3.5–5.2)
Anion gap: 13 (ref 5–15)
BILIRUBIN TOTAL: 0.2 mg/dL — AB (ref 0.3–1.2)
BUN: 18 mg/dL (ref 6–23)
CHLORIDE: 101 meq/L (ref 96–112)
CO2: 22 mEq/L (ref 19–32)
Calcium: 8.9 mg/dL (ref 8.4–10.5)
Creatinine, Ser: 0.9 mg/dL (ref 0.50–1.10)
GFR calc non Af Amer: 68 mL/min — ABNORMAL LOW (ref >90)
GFR, EST AFRICAN AMERICAN: 78 mL/min — AB (ref >90)
GLUCOSE: 126 mg/dL — AB (ref 70–99)
POTASSIUM: 4.6 meq/L (ref 3.7–5.3)
Sodium: 136 mEq/L — ABNORMAL LOW (ref 137–147)
Total Protein: 6.4 g/dL (ref 6.0–8.3)

## 2013-09-11 LAB — CBC WITH DIFFERENTIAL/PLATELET
Basophils Absolute: 0.1 10*3/uL (ref 0.0–0.1)
Basophils Relative: 2 % — ABNORMAL HIGH (ref 0–1)
Eosinophils Absolute: 0.3 10*3/uL (ref 0.0–0.7)
Eosinophils Relative: 6 % — ABNORMAL HIGH (ref 0–5)
HCT: 37.4 % (ref 36.0–46.0)
HEMOGLOBIN: 12.5 g/dL (ref 12.0–15.0)
Lymphocytes Relative: 26 % (ref 12–46)
Lymphs Abs: 1.3 10*3/uL (ref 0.7–4.0)
MCH: 34.8 pg — AB (ref 26.0–34.0)
MCHC: 33.4 g/dL (ref 30.0–36.0)
MCV: 104.2 fL — ABNORMAL HIGH (ref 78.0–100.0)
MONOS PCT: 11 % (ref 3–12)
Monocytes Absolute: 0.6 10*3/uL (ref 0.1–1.0)
NEUTROS ABS: 2.9 10*3/uL (ref 1.7–7.7)
NEUTROS PCT: 55 % (ref 43–77)
Platelets: 226 10*3/uL (ref 150–400)
RBC: 3.59 MIL/uL — ABNORMAL LOW (ref 3.87–5.11)
RDW: 13 % (ref 11.5–15.5)
WBC: 5.2 10*3/uL (ref 4.0–10.5)

## 2013-09-11 LAB — URINALYSIS, ROUTINE W REFLEX MICROSCOPIC
Bilirubin Urine: NEGATIVE
GLUCOSE, UA: NEGATIVE mg/dL
KETONES UR: 15 mg/dL — AB
Nitrite: NEGATIVE
PROTEIN: 100 mg/dL — AB
Specific Gravity, Urine: 1.023 (ref 1.005–1.030)
Urobilinogen, UA: 0.2 mg/dL (ref 0.0–1.0)
pH: 5 (ref 5.0–8.0)

## 2013-09-11 LAB — LIPASE, BLOOD: Lipase: 33 U/L (ref 11–59)

## 2013-09-11 LAB — URINE MICROSCOPIC-ADD ON

## 2013-09-11 MED ORDER — TAMSULOSIN HCL 0.4 MG PO CAPS
0.4000 mg | ORAL_CAPSULE | Freq: Once | ORAL | Status: AC
  Administered 2013-09-11: 0.4 mg via ORAL
  Filled 2013-09-11: qty 1

## 2013-09-11 MED ORDER — KETOROLAC TROMETHAMINE 15 MG/ML IJ SOLN
15.0000 mg | Freq: Once | INTRAMUSCULAR | Status: AC
  Administered 2013-09-11: 15 mg via INTRAVENOUS
  Filled 2013-09-11: qty 1

## 2013-09-11 MED ORDER — HYDROMORPHONE HCL 2 MG PO TABS: 2.0000 mg | ORAL_TABLET | ORAL | Status: DC | PRN

## 2013-09-11 MED ORDER — HYDROMORPHONE HCL PF 1 MG/ML IJ SOLN
1.0000 mg | Freq: Once | INTRAMUSCULAR | Status: AC
  Administered 2013-09-11: 1 mg via INTRAVENOUS
  Filled 2013-09-11: qty 1

## 2013-09-11 MED ORDER — IBUPROFEN 600 MG PO TABS: 600.0000 mg | ORAL_TABLET | Freq: Four times a day (QID) | ORAL | Status: DC | PRN

## 2013-09-11 MED ORDER — PROMETHAZINE HCL 25 MG PO TABS: 25.0000 mg | ORAL_TABLET | Freq: Four times a day (QID) | ORAL | Status: DC | PRN

## 2013-09-11 MED ORDER — PROMETHAZINE HCL 25 MG/ML IJ SOLN
12.5000 mg | Freq: Once | INTRAMUSCULAR | Status: AC
  Administered 2013-09-11: 12.5 mg via INTRAVENOUS
  Filled 2013-09-11: qty 1

## 2013-09-11 MED ORDER — ONDANSETRON HCL 4 MG/2ML IJ SOLN
4.0000 mg | Freq: Once | INTRAMUSCULAR | Status: AC
  Administered 2013-09-11: 4 mg via INTRAVENOUS
  Filled 2013-09-11: qty 2

## 2013-09-11 MED ORDER — SODIUM CHLORIDE 0.9 % IV BOLUS (SEPSIS)
1000.0000 mL | Freq: Once | INTRAVENOUS | Status: AC
  Administered 2013-09-11: 1000 mL via INTRAVENOUS

## 2013-09-11 MED ORDER — FENTANYL CITRATE 0.05 MG/ML IJ SOLN
50.0000 ug | Freq: Once | INTRAMUSCULAR | Status: AC
  Administered 2013-09-11: 50 ug via INTRAVENOUS
  Filled 2013-09-11: qty 2

## 2013-09-11 MED ORDER — TAMSULOSIN HCL 0.4 MG PO CAPS: ORAL_CAPSULE | ORAL | Status: DC

## 2013-09-11 NOTE — ED Notes (Signed)
MD at bedside. 

## 2013-09-11 NOTE — ED Provider Notes (Signed)
CSN: [id number]     Arrival date & time 09/10/13  2323 History   None    Chief Complaint  Patient presents with  . Flank Pain     (Consider location/radiation/quality/duration/timing/severity/associated sxs/prior Treatment) HPI 63 year old female with a history of ureterolithiasis in January of this year. At that time a CT scan showed a 4 mm stone in her left kidney. She is here with severe left flank pain radiating to her left lower quadrant that began about 9:30 yesterday evening. She describes it as a deep pain, "inside me". She rates it a 10 out of 10 at its worst. It has been associated with nausea but no vomiting. She was given Zofran and fentanyl per protocol with partial improvement. The pain is not worse with movement. She cannot find a comfortable position. The pain is similar to previous ureterolithiasis but more severe.  Past Medical History  Diagnosis Date  . HTN (hypertension)   . Anxiety   . Depression   . HLD (hyperlipidemia)   . Asthma   . Allergic rhinitis   . Essential tremor   . Kidney stones    Past Surgical History  Procedure Laterality Date  . Cervical ablation    . Foot surgery    . Dilation and curettage of uterus    . Liposuction    . Laparoscopic assisted vaginal hysterectomy N/A 04/23/2013    Procedure: LAPAROSCOPIC ASSISTED VAGINAL HYSTERECTOMY;  Surgeon: Cyril Mourning, MD;  Location: [facility];  Service: Gynecology;  Laterality: N/A;  . Salpingoophorectomy Bilateral 04/23/2013    Procedure: SALPINGO OOPHORECTOMY;  Surgeon: Cyril Mourning, MD;  Location: [facility];  Service: Gynecology;  Laterality: Bilateral;   Family History  Problem Relation Age of Onset  . Heart attack Father   . Coronary artery disease Father   . Hypertension Father   . Diabetes Father    History  Substance Use Topics  . Smoking status: Former Smoker -- 1.00 packs/day for 14 years    Types: Cigarettes    Quit date: 04/22/1980  . Smokeless tobacco: Not on file  . Alcohol  Use: 1.2 oz/week    2 Glasses of wine per week     Comment: white   OB History   Grav Para Term Preterm Abortions TAB SAB Ect Mult Living                 Review of Systems  All other systems reviewed and are negative.   Allergies  Metronidazole; Penicillins; Sulfonamide derivatives; and Amoxicillin  Home Medications   Prior to Admission medications   Medication Sig Start Date End Date Taking? Authorizing Provider  ALPRAZolam Duanne Moron) 0.5 MG tablet Take 0.5 mg by mouth daily as needed for anxiety.     Historical Provider, MD  Azelastine-Fluticasone (DYMISTA) 137-50 MCG/ACT SUSP Place 2 sprays into both nostrils at bedtime. 05/16/13   Deneise Lever, MD  BYSTOLIC 10 MG tablet Take 10 mg by mouth daily. 12/31/12   Historical Provider, MD  cetirizine (ZYRTEC) 10 MG tablet Take 10 mg by mouth daily.    Historical Provider, MD  cloNIDine (CATAPRES) 0.2 MG tablet Take 0.2 mg by mouth daily. 12/25/12   Historical Provider, MD  desvenlafaxine (PRISTIQ) 50 MG 24 hr tablet Take 50 mg by mouth daily.     Historical Provider, MD  famotidine (PEPCID AC) 10 MG tablet Take 10 mg by mouth daily as needed for heartburn or indigestion.    Historical Provider, MD  fenofibrate 160 MG tablet  Take 160 mg by mouth at bedtime.     Historical Provider, MD  fish oil-omega-3 fatty acids 1000 MG capsule Take 1 g by mouth daily.     Historical Provider, MD  FLUoxetine (PROZAC) 10 MG capsule Take 10 mg by mouth daily.    Historical Provider, MD  fluticasone (FLONASE) 50 MCG/ACT nasal spray Place 2 sprays into the nose daily.    Historical Provider, MD  ibuprofen (ADVIL,MOTRIN) 200 MG tablet Take 400 mg by mouth every 6 (six) hours as needed for moderate pain.    Historical Provider, MD  ibuprofen (ADVIL,MOTRIN) 600 MG tablet Take 1 tablet (600 mg total) by mouth every 6 (six) hours as needed (mild pain). 04/24/13   Cyril Mourning, MD  Plant Sterols and Stanols (CHOLESTOFF PLUS) 450 MG CAPS Take 1 capsule by mouth  daily.    Historical Provider, MD  traMADol (ULTRAM) 50 MG tablet Take 1 tablet (50 mg total) by mouth every 6 (six) hours as needed for moderate pain. 04/24/13   Cyril Mourning, MD  valsartan (DIOVAN) 40 MG tablet Take 40 mg by mouth 2 (two) times daily.     Historical Provider, MD  VENTOLIN HFA 108 (90 BASE) MCG/ACT inhaler Inhale 2 puffs into the lungs 4 (four) times daily as needed. 03/07/13   Historical Provider, MD   BP 123/75  Pulse 73  Temp(Src) 97.6 F (36.4 C) (Oral)  Resp 16  Ht 5\' 8"  (1.727 m)  Wt 180 lb (81.647 kg)  BMI 27.38 kg/m2  SpO2 94%  Physical Exam General: Well-developed, well-nourished female in no acute distress; appearance consistent with age of record HENT: normocephalic; atraumatic Eyes: pupils equal, round and reactive to light; extraocular muscles intact Neck: supple Heart: regular rate and rhythm Lungs: clear to auscultation bilaterally Abdomen: soft; nondistended; nontender; no masses or hepatosplenomegaly; bowel sounds present GU: mild left CVA tenderness Extremities: No deformity; full range of motion; pulses normal Neurologic: Awake, alert and oriented; motor function intact in all extremities and symmetric; no facial droop Skin: Warm and dry Psychiatric: Flat affect    ED Course  Procedures (including critical care time)  MDM   Nursing notes and vitals signs, including pulse oximetry, reviewed.  Summary of this visit's results, reviewed by myself:  Labs:  Results for orders placed during the hospital encounter of 09/10/13 (from the past 24 hour(s))  URINALYSIS, ROUTINE W REFLEX MICROSCOPIC     Status: Abnormal   Collection Time    09/11/13 12:11 AM      Result Value Ref Range   Color, Urine AMBER (*) YELLOW   APPearance TURBID (*) CLEAR   Specific Gravity, Urine 1.023  1.005 - 1.030   pH 5.0  5.0 - 8.0   Glucose, UA NEGATIVE  NEGATIVE mg/dL   Hgb urine dipstick LARGE (*) NEGATIVE   Bilirubin Urine NEGATIVE  NEGATIVE   Ketones, ur  15 (*) NEGATIVE mg/dL   Protein, ur 100 (*) NEGATIVE mg/dL   Urobilinogen, UA 0.2  0.0 - 1.0 mg/dL   Nitrite NEGATIVE  NEGATIVE   Leukocytes, UA SMALL (*) NEGATIVE  URINE MICROSCOPIC-ADD ON     Status: Abnormal   Collection Time    09/11/13 12:11 AM      Result Value Ref Range   Squamous Epithelial / LPF RARE  RARE   WBC, UA 3-6  <3 WBC/hpf   RBC / HPF TOO NUMEROUS TO COUNT  <3 RBC/hpf   Bacteria, UA MANY (*) RARE   Crystals  URIC ACID CRYSTALS (*) NEGATIVE  CBC WITH DIFFERENTIAL     Status: Abnormal   Collection Time    09/11/13 12:50 AM      Result Value Ref Range   WBC 5.2  4.0 - 10.5 K/uL   RBC 3.59 (*) 3.87 - 5.11 MIL/uL   Hemoglobin 12.5  12.0 - 15.0 g/dL   HCT 37.4  36.0 - 46.0 %   MCV 104.2 (*) 78.0 - 100.0 fL   MCH 34.8 (*) 26.0 - 34.0 pg   MCHC 33.4  30.0 - 36.0 g/dL   RDW 13.0  11.5 - 15.5 %   Platelets 226  150 - 400 K/uL   Neutrophils Relative % 55  43 - 77 %   Neutro Abs 2.9  1.7 - 7.7 K/uL   Lymphocytes Relative 26  12 - 46 %   Lymphs Abs 1.3  0.7 - 4.0 K/uL   Monocytes Relative 11  3 - 12 %   Monocytes Absolute 0.6  0.1 - 1.0 K/uL   Eosinophils Relative 6 (*) 0 - 5 %   Eosinophils Absolute 0.3  0.0 - 0.7 K/uL   Basophils Relative 2 (*) 0 - 1 %   Basophils Absolute 0.1  0.0 - 0.1 K/uL  COMPREHENSIVE METABOLIC PANEL     Status: Abnormal   Collection Time    09/11/13 12:50 AM      Result Value Ref Range   Sodium 136 (*) 137 - 147 mEq/L   Potassium 4.6  3.7 - 5.3 mEq/L   Chloride 101  96 - 112 mEq/L   CO2 22  19 - 32 mEq/L   Glucose, Bld 126 (*) 70 - 99 mg/dL   BUN 18  6 - 23 mg/dL   Creatinine, Ser 0.90  0.50 - 1.10 mg/dL   Calcium 8.9  8.4 - 10.5 mg/dL   Total Protein 6.4  6.0 - 8.3 g/dL   Albumin 3.2 (*) 3.5 - 5.2 g/dL   AST 19  0 - 37 U/L   ALT 20  0 - 35 U/L   Alkaline Phosphatase 39  39 - 117 U/L   Total Bilirubin 0.2 (*) 0.3 - 1.2 mg/dL   GFR calc non Af Amer 68 (*) >90 mL/min   GFR calc Af Amer 78 (*) >90 mL/min   Anion gap 13  5 - 15   LIPASE, BLOOD     Status: None   Collection Time    09/11/13 12:50 AM      Result Value Ref Range   Lipase 33  11 - 59 U/L   Medications  ondansetron (ZOFRAN) injection 4 mg (4 mg Intravenous Given 09/11/13 0028)  fentaNYL (SUBLIMAZE) injection 50 mcg (50 mcg Intravenous Given 09/11/13 0028)  sodium chloride 0.9 % bolus 1,000 mL (0 mLs Intravenous Stopped 09/11/13 0124)  promethazine (PHENERGAN) injection 12.5 mg (12.5 mg Intravenous Given 09/11/13 0121)  HYDROmorphone (DILAUDID) injection 1 mg (1 mg Intravenous Given 09/11/13 0116)  tamsulosin (FLOMAX) capsule 0.4 mg (0.4 mg Oral Given 09/11/13 0124)  ketorolac (TORADOL) 15 MG/ML injection 15 mg (15 mg Intravenous Given 09/11/13 0118)       Karen Chafe Shivam Mestas, MD 09/11/13 7342

## 2013-09-11 NOTE — Discharge Instructions (Signed)

## 2013-10-15 ENCOUNTER — Encounter: Payer: Self-pay | Admitting: Neurology

## 2013-10-15 ENCOUNTER — Ambulatory Visit (INDEPENDENT_AMBULATORY_CARE_PROVIDER_SITE_OTHER): Payer: 59 | Admitting: Neurology

## 2013-10-15 ENCOUNTER — Encounter (INDEPENDENT_AMBULATORY_CARE_PROVIDER_SITE_OTHER): Payer: Self-pay

## 2013-10-15 VITALS — BP 117/77 | HR 88 | Temp 98.0°F | Resp 14 | Ht 68.5 in | Wt 189.0 lb

## 2013-10-15 DIAGNOSIS — R51 Headache: Secondary | ICD-10-CM

## 2013-10-15 DIAGNOSIS — R351 Nocturia: Secondary | ICD-10-CM

## 2013-10-15 DIAGNOSIS — R519 Headache, unspecified: Secondary | ICD-10-CM

## 2013-10-15 DIAGNOSIS — R0689 Other abnormalities of breathing: Secondary | ICD-10-CM

## 2013-10-15 DIAGNOSIS — R0683 Snoring: Secondary | ICD-10-CM

## 2013-10-15 DIAGNOSIS — G4763 Sleep related bruxism: Secondary | ICD-10-CM

## 2013-10-15 DIAGNOSIS — G478 Other sleep disorders: Secondary | ICD-10-CM

## 2013-10-15 DIAGNOSIS — G4719 Other hypersomnia: Secondary | ICD-10-CM

## 2013-10-15 NOTE — Progress Notes (Addendum)
Subjective:    Patient ID: Cynthia Mccullough is a 63 y.o. female.  HPI    Star Age, MD, PhD [facility] [address]O. [place], [place] [address]   Dear [name],   I saw your patient, Cynthia Mccullough, upon your kind request in my neurologic clinic today for initial consultation of her sleep disturbance, in particular, her excessive daytime somnolence. The patient is unaccompanied today. As you know, Cynthia Mccullough is a 63 year old right-handed woman with an underlying medical history of hypertension, ex-smoker, essential tremor, allergic rhinitis and asthma for which she sees Dr. Annamaria Boots, kidney stones, hyperlipidemia, overweight state, depression, and anxiety, who reports EDS for some years and this has been worse in the last 2-3 years, especially since her husband's health issues. She has gained weight in the realm of 25-30 lb in the last 3 years. She snores, and has gasping sensations. She naps 1-2 times in the day. She has nocturia 1-2 per night. She goes to bed between 10-11 PM and usually takes 0.5 to 0.75 mg of Xanax. Her usual weight time is 9 to 9:30. She does not wake up rested and has had morning headaches. In the past she had migraines and these improved after menopause but recently in the past few months she had occasional migraine auras but was able to avoid a full-fledged migraine by taking 600 mg of ibuprofen and lying down to rest. Her husband has noted snoring. She has woken up with a gasping sensation. She grinds her teeth. She denies RLS symptoms. She talks in her sleep and had sleep walking once. Her ESS is 14/24.  She is not aware of any family history of OSA. She does recall that even as a child she was sleepier than her other siblings and she is one of 5 kids. She was the one that needed the most sleep.   Her Past Medical History Is Significant For: Past Medical History  Diagnosis Date  . HTN (hypertension)   . Anxiety    . Depression   . HLD (hyperlipidemia)   . Asthma   . Allergic rhinitis   . Essential tremor   . Kidney stones   . Panic attacks     Her Past Surgical History Is Significant For: Past Surgical History  Procedure Laterality Date  . Cervical ablation    . Foot surgery    . Dilation and curettage of uterus    . Liposuction    . Laparoscopic assisted vaginal hysterectomy N/A 04/23/2013    Procedure: LAPAROSCOPIC ASSISTED VAGINAL HYSTERECTOMY;  Surgeon: Cyril Mourning, MD;  Location: [facility];  Service: Gynecology;  Laterality: N/A;  . Salpingoophorectomy Bilateral 04/23/2013    Procedure: SALPINGO OOPHORECTOMY;  Surgeon: Cyril Mourning, MD;  Location: [facility];  Service: Gynecology;  Laterality: Bilateral;  . Excision morton's neuroma Right 2008    Her Family History Is Significant For: Family History  Problem Relation Age of Onset  . Heart attack Father   . Coronary artery disease Father   . Hypertension Father   . Diabetes Father   . Cancer - Prostate Father   . Cancer - Other Maternal Grandmother     breast and bone  . Cancer - Other Maternal Grandfather     liver    Her Social History Is Significant For: History   Social History  . Marital Status: Married    Spouse Name: N/A    Number of Children: 2  . 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 Smoking status: Current Some Day Smoker -- 14 years    Types: Cigarettes    Last Attempt to Quit: 04/22/1980  . Smokeless tobacco: None     Comment: occasional cigs/ day  . Alcohol Use: 1.2 oz/week    2 Glasses of wine per week     Comment: white  . Drug Use: No  . Sexual Activity: None   Other Topics Concern  . None   Social History Narrative   Right handed.  Caffeine none, Married, 2 kids.  1-2 yrs college.   Home maker.     Her Allergies Are:  Allergies  Allergen Reactions  . Metronidazole Hives  . Penicillins Anaphylaxis    REACTION: Urticaria/  angioedema  . Sulfonamide Derivatives Hives    REACTION: urticaria  . Amoxicillin Hives  . Atorvastatin Other (See Comments)  . Rosuvastatin Itching  :   Her Current Medications Are:  Outpatient Encounter Prescriptions as of 10/15/2013  Medication Sig  . ALPRAZolam (XANAX) 0.5 MG tablet Take 0.5 mg by mouth daily as needed for anxiety.   Marland Kitchen BYSTOLIC 10 MG tablet Take 10 mg by mouth daily.  . cetirizine (ZYRTEC) 10 MG tablet Take 10 mg by mouth daily.  . cloNIDine (CATAPRES) 0.2 MG tablet Take 0.2 mg by mouth daily.  Marland Kitchen desvenlafaxine (PRISTIQ) 50 MG 24 hr tablet Take 50 mg by mouth daily.   . Estradiol (ELESTRIN) 0.52 MG/0.87 GM (0.06%) GEL Place onto the skin.  . famotidine (PEPCID AC) 10 MG tablet Take 10 mg by mouth daily as needed for heartburn or indigestion.  . fenofibrate 160 MG tablet Take 160 mg by mouth at bedtime.   . fish oil-omega-3 fatty acids 1000 MG capsule Take 1 g by mouth daily.   Marland Kitchen FLUoxetine (PROZAC) 10 MG capsule Take 20 mg by mouth daily.   . fluticasone (FLONASE) 50 MCG/ACT nasal spray Place 2 sprays into the nose daily.  Marland Kitchen ibuprofen (ADVIL,MOTRIN) 200 MG tablet Take 400 mg by mouth every 6 (six) hours as needed for moderate pain.  Marland Kitchen ibuprofen (ADVIL,MOTRIN) 600 MG tablet Take 1 tablet (600 mg total) by mouth every 6 (six) hours as needed (mild pain).  . Plant Sterols and Stanols (CHOLESTOFF PLUS) 450 MG CAPS Take 1 capsule by mouth daily.  . promethazine (PHENERGAN) 25 MG tablet Take 1 tablet (25 mg total) by mouth every 6 (six) hours as needed for nausea or vomiting.  . tamsulosin (FLOMAX) 0.4 MG CAPS capsule Take one capsule daily until stone passes.  . traMADol (ULTRAM) 50 MG tablet Take 1 tablet (50 mg total) by mouth every 6 (six) hours as needed for moderate pain.  . valsartan (DIOVAN) 40 MG tablet Take 40 mg by mouth 2 (two) times daily.   . VENTOLIN HFA 108 (90 BASE) MCG/ACT inhaler Inhale 2 puffs into the lungs 4 (four) times daily as needed.  .  [DISCONTINUED] Azelastine-Fluticasone (DYMISTA) 137-50 MCG/ACT SUSP Place 2 sprays into both nostrils at bedtime.  . [DISCONTINUED] HYDROmorphone (DILAUDID) 2 MG tablet Take 1 tablet (2 mg total) by mouth every 4 (four) hours as needed for severe pain (for pain).  :  Review of Systems:  Out of a complete 14 point review of systems, all are reviewed and negative with the exception of these symptoms as listed below:   Review of Systems  Constitutional: Positive for fatigue.  Respiratory: Positive for  cough.        Snoring  Cardiovascular: Positive for chest pain.  Endocrine: Positive for heat intolerance.       Flushing  Allergic/Immunologic: Positive for environmental allergies.       Runny nose, allergies  Neurological:       Snoring, sleepiness. Tremor (essential)  Psychiatric/Behavioral:       Depression, anxiety, too much sleep, decreased energy, disinterest in activities.    Objective:  Neurologic Exam  Physical Exam Physical Examination:   Filed Vitals:   10/15/13 1134  BP: 117/77  Pulse: 88  Temp: 98 F (36.7 C)  Resp: 14    General Examination: The patient is a very pleasant 63 y.o. female in no acute distress. She appears well-developed and well-nourished and well groomed.   HEENT: Normocephalic, atraumatic, pupils are equal, round and reactive to light and accommodation. Funduscopic exam is normal with sharp disc margins noted. Extraocular tracking is good without limitation to gaze excursion or nystagmus noted. Normal smooth pursuit is noted. Hearing is grossly intact. Tympanic membranes are clear bilaterally. Face is symmetric with normal facial animation and normal facial sensation. Speech is clear with no dysarthria noted. There is no hypophonia. There is no lip, or voice tremor. She does have a mild head tremor, no-no time. Neck is supple with full range of passive and active motion. There are no carotid bruits on auscultation. Oropharynx exam reveals: moderate  mouth dryness, good dental hygiene and mild to moderate airway crowding, due to redundant soft palate and narrow airway entry. Mallampati is class III. Tongue protrudes centrally and palate elevates symmetrically. Tonsils are small. Neck size is 14-3/8 inches. She has a mild overbite. She has had small nasal anatomy but no significant congestion or inferior turbinate hypertrophy but mild septal deviation to the right.  Chest: Clear to auscultation without wheezing, rhonchi or crackles noted.  Heart: S1+S2+0, regular and normal without murmurs, rubs or gallops noted.   Abdomen: Soft, non-tender and non-distended with normal bowel sounds appreciated on auscultation.  Extremities: There is no pitting edema in the distal lower extremities bilaterally. Pedal pulses are intact.  Skin: Warm and dry without trophic changes noted. There are no varicose veins.  Musculoskeletal: exam reveals no obvious joint deformities, tenderness or joint swelling or erythema.   Neurologically:  Mental status: The patient is awake, alert and oriented in all 4 spheres. Her immediate and remote memory, attention, language skills and fund of knowledge are appropriate. There is no evidence of aphasia, agnosia, apraxia or anomia. Speech is clear with normal prosody and enunciation. Thought process is linear. Mood is normal and affect is normal.  Cranial nerves II - XII are as described above under HEENT exam. In addition: shoulder shrug is normal with equal shoulder height noted. Motor exam: Normal bulk, strength and tone is noted. There is no drift, tremor or rebound. Romberg is negative. Reflexes are 2+ throughout. Babinski: Toes are flexor bilaterally. Fine motor skills and coordination: intact with normal finger taps, normal hand movements, normal rapid alternating patting, normal foot taps and normal foot agility.  Cerebellar testing: No dysmetria or intention tremor on finger to nose testing. Heel to shin is unremarkable  bilaterally. There is no truncal or gait ataxia.  Sensory exam: intact to light touch, pinprick, vibration, temperature sense in the upper and lower extremities.  Gait, station and balance: She stands easily. No veering to one side is noted. No leaning to one side is noted. Posture is age-appropriate and stance is  narrow based. Gait shows normal stride length and normal pace. No problems turning are noted. She turns en bloc. Tandem walk is difficult for her. Intact toe and heel stance is noted.               Assessment and Plan:   In summary, CHEVETTE FEE is a very pleasant 63 y.o.-year old female with an underlying medical history of hypertension, ex-smoker, essential tremor, allergic rhinitis and asthma for which she sees Dr. Annamaria Boots, kidney stones, hyperlipidemia, overweight state, depression, and anxiety, who reports EDS in the context of snoring, weight gain, nocturia reported, morning headaches reported and gasping sensations while asleep. Her history and physical exam concerning for obstructive sleep apnea (OSA). She has evidence of mild essential tremor, affecting primarily her head. She is reassured that this is a mild tremor. In fact, she did not have any hand tremor today. I had a long chat with the patient about my findings and the diagnosis of OSA, its prognosis and treatment options. We talked about medical treatments, surgical interventions and non-pharmacological approaches. I explained in particular the risks and ramifications of untreated moderate to severe OSA, especially with respect to developing cardiovascular disease down the Road, including congestive heart failure, difficult to treat hypertension, cardiac arrhythmias, or stroke. Even type 2 diabetes has, in part, been linked to untreated OSA. Symptoms of untreated OSA include daytime sleepiness, memory problems, mood irritability and mood disorder such as depression and anxiety, lack of energy, as well as recurrent headaches,  especially morning headaches. We talked about trying to maintain a healthy lifestyle in general, as well as the importance of weight control. I encouraged the patient to eat healthy, exercise daily and keep well hydrated, to keep a scheduled bedtime and wake time routine, to not skip any meals and eat healthy snacks in between meals. I advised the patient not to drive when feeling sleepy. I recommended the following at this time: sleep study with potential positive airway pressure titration. (We will score hypopneas at 4% and split the sleep study into diagnostic and treatment portion, if the estimated. 2 hour AHI is >20/h).   I explained the sleep test procedure to the patient and also outlined possible surgical and non-surgical treatment options of OSA, including the use of a custom-made dental device (which would require a referral to a specialist dentist or oral surgeon), upper airway surgical options, such as pillar implants, radiofrequency surgery, tongue base surgery, and UPPP (which would involve a referral to an ENT surgeon). Rarely, jaw surgery such as mandibular advancement may be considered.  I also explained the CPAP treatment option to the patient, who indicated that  she  would be willing to try CPAP if the need arises. I explained the importance of being compliant with PAP treatment, not only for insurance purposes but primarily to improve Her symptoms, and for the patient's long term health benefit, including to reduce Her cardiovascular risks. I answered all  her  questions today and the patient was in agreement. I would like to see  her  back after the sleep study is completed and encouraged  her  to call with any interim questions, concerns, problems or updates.   Thank you very much for allowing me to participate in the care of this nice patient. If I can be of any further assistance to you please do not hesitate to call me at [phone].  Sincerely,   Star Age, MD, PhD

## 2013-10-15 NOTE — Patient Instructions (Addendum)
Based on your symptoms and your exam I believe you are at risk for obstructive sleep apnea or OSA, and I think we should proceed with a sleep study to determine whether you do or do not have OSA and how severe it is. If you have more than mild OSA, I want you to consider treatment with CPAP. Please remember, the risks and ramifications of moderate to severe obstructive sleep apnea or OSA are: Cardiovascular disease, including congestive heart failure, stroke, difficult to control hypertension, arrhythmias, and even type 2 diabetes has been linked to untreated OSA. Sleep apnea causes disruption of sleep and sleep deprivation in most cases, which, in turn, can cause recurrent headaches, problems with memory, mood, concentration, focus, and vigilance. Most people with untreated sleep apnea report excessive daytime sleepiness, which can affect their ability to drive. Please do not drive if you feel sleepy.   I will see you back after your sleep study to go over the test results and where to go from there. We will call you after your sleep study and to set up an appointment at the time.   Please remember to bring all your night time meds.

## 2013-10-23 ENCOUNTER — Telehealth: Payer: Self-pay | Admitting: Neurology

## 2013-10-23 DIAGNOSIS — G478 Other sleep disorders: Secondary | ICD-10-CM

## 2013-10-23 DIAGNOSIS — R51 Headache: Secondary | ICD-10-CM

## 2013-10-23 DIAGNOSIS — R0689 Other abnormalities of breathing: Secondary | ICD-10-CM

## 2013-10-23 DIAGNOSIS — G4719 Other hypersomnia: Secondary | ICD-10-CM

## 2013-10-23 DIAGNOSIS — R519 Headache, unspecified: Secondary | ICD-10-CM

## 2013-10-23 DIAGNOSIS — R0683 Snoring: Secondary | ICD-10-CM

## 2013-10-23 DIAGNOSIS — R351 Nocturia: Secondary | ICD-10-CM

## 2013-10-23 DIAGNOSIS — G4763 Sleep related bruxism: Secondary | ICD-10-CM

## 2013-10-23 NOTE — Telephone Encounter (Signed)
This patient's insurance denied an attended sleep study. I will order home sleep test.  

## 2013-10-23 NOTE — Telephone Encounter (Signed)
Authorization request for an attended sleep study has been denied.  Pt will need to proceed with HST

## 2013-11-18 ENCOUNTER — Encounter: Payer: 59 | Admitting: *Deleted

## 2013-12-09 ENCOUNTER — Ambulatory Visit (INDEPENDENT_AMBULATORY_CARE_PROVIDER_SITE_OTHER): Payer: 59 | Admitting: Neurology

## 2013-12-09 DIAGNOSIS — G4719 Other hypersomnia: Secondary | ICD-10-CM

## 2013-12-09 DIAGNOSIS — G4733 Obstructive sleep apnea (adult) (pediatric): Secondary | ICD-10-CM

## 2013-12-09 DIAGNOSIS — G4734 Idiopathic sleep related nonobstructive alveolar hypoventilation: Secondary | ICD-10-CM

## 2013-12-09 NOTE — Sleep Study (Signed)
Please see the scanned sleep study interpretation located in the Procedure tab within the Chart Review section. 

## 2013-12-10 ENCOUNTER — Encounter (HOSPITAL_COMMUNITY): Payer: Self-pay | Admitting: Obstetrics and Gynecology

## 2013-12-17 ENCOUNTER — Telehealth: Payer: Self-pay | Admitting: Neurology

## 2013-12-17 DIAGNOSIS — G4734 Idiopathic sleep related nonobstructive alveolar hypoventilation: Secondary | ICD-10-CM

## 2013-12-17 DIAGNOSIS — G4733 Obstructive sleep apnea (adult) (pediatric): Secondary | ICD-10-CM

## 2013-12-17 NOTE — Telephone Encounter (Signed)
Please call and notify the patient that the recent home sleep test did suggest the diagnosis of obstructive sleep apnea and, given her medical history and desaturations below 88% repetitively, as well as due to her sleep related complaints, I recommend treatment for this in the form of CPAP. I will request an overnight sleep study for proper titration and mask fitting. Please explain to patient and arrange for a CPAP titration study. I have placed an order in the chart. Thanks, Star Age, MD, PhD [facility])

## 2013-12-18 NOTE — Telephone Encounter (Signed)
The patient was called and results reviewed.  She is aware of the finding of mild obstructive sleep apnea and O2 desaturations below 88%.  Due to the findings from this sleep study, Dr. Rexene Alberts has recommended treatment in the form of CPAP.  This will require the patient to return for another overnight sleep study for proper titration and mask fitting.  A copy of this report was sent to Dr. Chucky May and the patient has requested to receive her report via mail.

## 2013-12-22 ENCOUNTER — Telehealth: Payer: Self-pay | Admitting: Neurology

## 2013-12-22 NOTE — Telephone Encounter (Signed)
pls find out if we can do autoPAP at home

## 2013-12-22 NOTE — Telephone Encounter (Signed)
Dr. Rexene Alberts you put order in for patient to have a CPAP titration performed we submitted documents to [facility] for authorization and they have denied the request.

## 2013-12-29 ENCOUNTER — Other Ambulatory Visit: Payer: Self-pay | Admitting: Neurology

## 2013-12-29 ENCOUNTER — Encounter: Payer: Self-pay | Admitting: Neurology

## 2013-12-29 DIAGNOSIS — G4733 Obstructive sleep apnea (adult) (pediatric): Secondary | ICD-10-CM

## 2013-12-29 NOTE — Progress Notes (Unsigned)
Please call and notify the patient that I have placed an order for AutoPAP. Please send referral, talk to patient, send report to PCP and referring MD, also a copy to patient and we will need a FU in sleep clinic for 6-8 weeks post-PAP set up. Thanks,   Star Age, MD, PhD [facility])

## 2013-12-30 ENCOUNTER — Encounter: Payer: Self-pay | Admitting: Neurology

## 2013-12-30 NOTE — Telephone Encounter (Signed)
Dr. Rexene Alberts placed an order for Auto CPAP and the patient was contacted and referred to [facility] for CPAP set up.  [facility] was notified of the order.

## 2014-01-18 ENCOUNTER — Encounter (HOSPITAL_BASED_OUTPATIENT_CLINIC_OR_DEPARTMENT_OTHER): Payer: Self-pay | Admitting: *Deleted

## 2014-01-18 ENCOUNTER — Emergency Department (HOSPITAL_BASED_OUTPATIENT_CLINIC_OR_DEPARTMENT_OTHER)
Admission: EM | Admit: 2014-01-18 | Discharge: 2014-01-18 | Disposition: A | Discharge status: Home / Self Care | Payer: 59 | Attending: Emergency Medicine | Admitting: Emergency Medicine

## 2014-01-18 DIAGNOSIS — F329 Major depressive disorder, single episode, unspecified: Secondary | ICD-10-CM | POA: Insufficient documentation

## 2014-01-18 DIAGNOSIS — Z720 Tobacco use: Secondary | ICD-10-CM | POA: Diagnosis not present

## 2014-01-18 DIAGNOSIS — E785 Hyperlipidemia, unspecified: Secondary | ICD-10-CM | POA: Diagnosis not present

## 2014-01-18 DIAGNOSIS — Z880 Allergy status to penicillin: Secondary | ICD-10-CM | POA: Insufficient documentation

## 2014-01-18 DIAGNOSIS — Z7951 Long term (current) use of inhaled steroids: Secondary | ICD-10-CM | POA: Insufficient documentation

## 2014-01-18 DIAGNOSIS — I10 Essential (primary) hypertension: Secondary | ICD-10-CM | POA: Insufficient documentation

## 2014-01-18 DIAGNOSIS — Z79899 Other long term (current) drug therapy: Secondary | ICD-10-CM | POA: Insufficient documentation

## 2014-01-18 DIAGNOSIS — J45909 Unspecified asthma, uncomplicated: Secondary | ICD-10-CM | POA: Diagnosis not present

## 2014-01-18 DIAGNOSIS — F410 Panic disorder [episodic paroxysmal anxiety] without agoraphobia: Secondary | ICD-10-CM | POA: Insufficient documentation

## 2014-01-18 DIAGNOSIS — N23 Unspecified renal colic: Secondary | ICD-10-CM | POA: Diagnosis not present

## 2014-01-18 DIAGNOSIS — R109 Unspecified abdominal pain: Secondary | ICD-10-CM | POA: Diagnosis present

## 2014-01-18 LAB — CBC WITH DIFFERENTIAL/PLATELET
BASOS PCT: 1 % (ref 0–1)
Basophils Absolute: 0.1 10*3/uL (ref 0.0–0.1)
EOS ABS: 0.3 10*3/uL (ref 0.0–0.7)
EOS PCT: 5 % (ref 0–5)
HEMATOCRIT: 40 % (ref 36.0–46.0)
Hemoglobin: 12.9 g/dL (ref 12.0–15.0)
Lymphocytes Relative: 23 % (ref 12–46)
Lymphs Abs: 1.4 10*3/uL (ref 0.7–4.0)
MCH: 33.4 pg (ref 26.0–34.0)
MCHC: 32.3 g/dL (ref 30.0–36.0)
MCV: 103.6 fL — AB (ref 78.0–100.0)
MONOS PCT: 8 % (ref 3–12)
Monocytes Absolute: 0.5 10*3/uL (ref 0.1–1.0)
NEUTROS PCT: 63 % (ref 43–77)
Neutro Abs: 3.8 10*3/uL (ref 1.7–7.7)
PLATELETS: 239 10*3/uL (ref 150–400)
RBC: 3.86 MIL/uL — ABNORMAL LOW (ref 3.87–5.11)
RDW: 12.5 % (ref 11.5–15.5)
WBC: 6 10*3/uL (ref 4.0–10.5)

## 2014-01-18 LAB — COMPREHENSIVE METABOLIC PANEL
ALT: 23 U/L (ref 0–35)
AST: 25 U/L (ref 0–37)
Albumin: 4.1 g/dL (ref 3.5–5.2)
Alkaline Phosphatase: 39 U/L (ref 39–117)
Anion gap: 9 (ref 5–15)
BUN: 16 mg/dL (ref 6–23)
CO2: 23 mmol/L (ref 19–32)
CREATININE: 0.9 mg/dL (ref 0.50–1.10)
Calcium: 8.5 mg/dL (ref 8.4–10.5)
Chloride: 99 mEq/L (ref 96–112)
GFR calc Af Amer: 78 mL/min — ABNORMAL LOW (ref >90)
GFR, EST NON AFRICAN AMERICAN: 68 mL/min — AB (ref >90)
Glucose, Bld: 121 mg/dL — ABNORMAL HIGH (ref 70–99)
Potassium: 3.5 mmol/L (ref 3.5–5.1)
SODIUM: 131 mmol/L — AB (ref 135–145)
Total Bilirubin: 0.5 mg/dL (ref 0.3–1.2)
Total Protein: 7 g/dL (ref 6.0–8.3)

## 2014-01-18 LAB — URINALYSIS, ROUTINE W REFLEX MICROSCOPIC
Bilirubin Urine: NEGATIVE
GLUCOSE, UA: NEGATIVE mg/dL
Ketones, ur: NEGATIVE mg/dL
NITRITE: NEGATIVE
PH: 5.5 (ref 5.0–8.0)
Protein, ur: 30 mg/dL — AB
Specific Gravity, Urine: 1.014 (ref 1.005–1.030)
UROBILINOGEN UA: 0.2 mg/dL (ref 0.0–1.0)

## 2014-01-18 LAB — LIPASE, BLOOD: LIPASE: 23 U/L (ref 11–59)

## 2014-01-18 LAB — URINE MICROSCOPIC-ADD ON

## 2014-01-18 MED ORDER — SODIUM CHLORIDE 0.9 % IV SOLN
1000.0000 mL | INTRAVENOUS | Status: DC
  Administered 2014-01-18: 1000 mL via INTRAVENOUS

## 2014-01-18 MED ORDER — CIPROFLOXACIN HCL 500 MG PO TABS: 500.0000 mg | ORAL_TABLET | Freq: Two times a day (BID) | ORAL | Status: DC

## 2014-01-18 MED ORDER — TAMSULOSIN HCL 0.4 MG PO CAPS: 0.4000 mg | ORAL_CAPSULE | Freq: Every day | ORAL | Status: DC

## 2014-01-18 MED ORDER — ONDANSETRON 8 MG PO TBDP: 8.0000 mg | ORAL_TABLET | Freq: Three times a day (TID) | ORAL | Status: DC | PRN

## 2014-01-18 MED ORDER — HYDROMORPHONE HCL 1 MG/ML IJ SOLN
1.0000 mg | INTRAMUSCULAR | Status: DC | PRN
  Administered 2014-01-18: 1 mg via INTRAVENOUS
  Filled 2014-01-18: qty 1

## 2014-01-18 MED ORDER — SODIUM CHLORIDE 0.9 % IV SOLN
1000.0000 mL | Freq: Once | INTRAVENOUS | Status: AC
Start: 2014-01-18 — End: 2014-01-18
  Administered 2014-01-18: 1000 mL via INTRAVENOUS

## 2014-01-18 MED ORDER — ONDANSETRON HCL 4 MG/2ML IJ SOLN
4.0000 mg | Freq: Once | INTRAMUSCULAR | Status: AC
  Administered 2014-01-18: 4 mg via INTRAVENOUS
  Filled 2014-01-18: qty 2

## 2014-01-18 MED ORDER — OXYCODONE HCL 5 MG PO TABS: 5.0000 mg | ORAL_TABLET | Freq: Four times a day (QID) | ORAL | Status: DC | PRN

## 2014-01-18 NOTE — Discharge Instructions (Signed)

## 2014-01-18 NOTE — ED Provider Notes (Signed)
CSN: [id number]     Arrival date & time 01/18/14  3664 History   First MD Initiated Contact with Patient 01/18/14 [phone]     Chief Complaint  Patient presents with  . Flank Pain   Patient is a 64 y.o. female presenting with flank pain. The history is provided by the patient.  Flank Pain This is a recurrent problem. Episode onset: woke her up this am around 3am. The problem occurs constantly. The problem has not changed since onset.Associated symptoms include abdominal pain. Pertinent negatives include no chest pain, no headaches and no shortness of breath. Nothing aggravates the symptoms. Nothing relieves the symptoms. Treatments tried: She took flomax and and old dilaudid tablet from a prior kidney stone, The treatment provided mild relief.  No vomiting.  Persistent nausea.  History of prior kidney stones.  Past Medical History  Diagnosis Date  . HTN (hypertension)   . Anxiety   . Depression   . HLD (hyperlipidemia)   . Asthma   . Allergic rhinitis   . Essential tremor   . Kidney stones   . Panic attacks    Past Surgical History  Procedure Laterality Date  . Cervical ablation    . Foot surgery    . Dilation and curettage of uterus    . Liposuction    . Excision morton's neuroma Right 2008  . Laparoscopic assisted vaginal hysterectomy N/A 04/23/2013    Procedure: LAPAROSCOPIC ASSISTED VAGINAL HYSTERECTOMY;  Surgeon: Cyril Mourning, MD;  Location: [facility];  Service: Gynecology;  Laterality: N/A;  . Salpingoophorectomy Bilateral 04/23/2013    Procedure: SALPINGO OOPHORECTOMY;  Surgeon: Cyril Mourning, MD;  Location: [facility];  Service: Gynecology;  Laterality: Bilateral;   Family History  Problem Relation Age of Onset  . Heart attack Father   . Coronary artery disease Father   . Hypertension Father   . Diabetes Father   . Cancer - Prostate Father   . Cancer - Other Maternal Grandmother     breast and bone  . Cancer - Other Maternal Grandfather     liver   History   Substance Use Topics  . Smoking status: Current Some Day Smoker -- 14 years    Types: Cigarettes    Last Attempt to Quit: 04/22/1980  . Smokeless tobacco: Not on file     Comment: occasional cigs/ day  . Alcohol Use: 1.2 oz/week    2 Glasses of wine per week     Comment: white   OB History    No data available     Review of Systems  Respiratory: Negative for shortness of breath.   Cardiovascular: Negative for chest pain.  Gastrointestinal: Positive for abdominal pain.  Genitourinary: Positive for flank pain.  Neurological: Negative for headaches.  All other systems reviewed and are negative.     Allergies  Metronidazole; Penicillins; Sulfonamide derivatives; Amoxicillin; Atorvastatin; and Rosuvastatin  Home Medications   Prior to Admission medications   Medication Sig Start Date End Date Taking? Authorizing Provider  ALPRAZolam Duanne Moron) 0.5 MG tablet Take 0.5 mg by mouth daily as needed for anxiety.     Historical Provider, MD  BYSTOLIC 10 MG tablet Take 10 mg by mouth daily. 12/31/12   Historical Provider, MD  cetirizine (ZYRTEC) 10 MG tablet Take 10 mg by mouth daily.    Historical Provider, MD  ciprofloxacin (CIPRO) 500 MG tablet Take 1 tablet (500 mg total) by mouth 2 (two) times daily. 01/18/14   Dorie Rank, MD  cloNIDine (  CATAPRES) 0.2 MG tablet Take 0.2 mg by mouth daily. 12/25/12   Historical Provider, MD  desvenlafaxine (PRISTIQ) 50 MG 24 hr tablet Take 50 mg by mouth daily.     Historical Provider, MD  Estradiol (ELESTRIN) 0.52 MG/0.87 GM (0.06%) GEL Place onto the skin.    Historical Provider, MD  famotidine (PEPCID AC) 10 MG tablet Take 10 mg by mouth daily as needed for heartburn or indigestion.    Historical Provider, MD  fenofibrate 160 MG tablet Take 160 mg by mouth at bedtime.     Historical Provider, MD  fish oil-omega-3 fatty acids 1000 MG capsule Take 1 g by mouth daily.     Historical Provider, MD  FLUoxetine (PROZAC) 10 MG capsule Take 20 mg by mouth  daily.     Historical Provider, MD  fluticasone (FLONASE) 50 MCG/ACT nasal spray Place 2 sprays into the nose daily.    Historical Provider, MD  ondansetron (ZOFRAN ODT) 8 MG disintegrating tablet Take 1 tablet (8 mg total) by mouth every 8 (eight) hours as needed for nausea or vomiting. 01/18/14   Dorie Rank, MD  oxyCODONE (OXY IR/ROXICODONE) 5 MG immediate release tablet Take 1-2 tablets (5-10 mg total) by mouth every 6 (six) hours as needed for severe pain. 01/18/14   Dorie Rank, MD  Plant Sterols and Stanols (CHOLESTOFF PLUS) 450 MG CAPS Take 1 capsule by mouth daily.    Historical Provider, MD  tamsulosin (FLOMAX) 0.4 MG CAPS capsule Take 1 capsule (0.4 mg total) by mouth daily. 01/18/14   Dorie Rank, MD  valsartan (DIOVAN) 40 MG tablet Take 40 mg by mouth 2 (two) times daily.     Historical Provider, MD  VENTOLIN HFA 108 (90 BASE) MCG/ACT inhaler Inhale 2 puffs into the lungs 4 (four) times daily as needed. 03/07/13   Historical Provider, MD   BP 129/78 mmHg  Pulse 71  Temp(Src) 97.9 F (36.6 C)  Resp 18  Ht 5\' 8"  (1.727 m)  Wt 185 lb (83.915 kg)  BMI 28.14 kg/m2  SpO2 99% Physical Exam  Constitutional: She appears well-developed and well-nourished. No distress.  HENT:  Head: Normocephalic and atraumatic.  Right Ear: External ear normal.  Left Ear: External ear normal.  Eyes: Conjunctivae are normal. Right eye exhibits no discharge. Left eye exhibits no discharge. No scleral icterus.  Neck: Neck supple. No tracheal deviation present.  Cardiovascular: Normal rate, regular rhythm and intact distal pulses.   Pulmonary/Chest: Effort normal and breath sounds normal. No stridor. No respiratory distress. She has no wheezes. She has no rales.  Abdominal: Soft. Bowel sounds are normal. She exhibits no distension. There is no tenderness. There is CVA tenderness (left). There is no rigidity, no rebound and no guarding.  Musculoskeletal: She exhibits no edema or tenderness.  Neurological: She is  alert. She has normal strength. No cranial nerve deficit (no facial droop, extraocular movements intact, no slurred speech) or sensory deficit. She exhibits normal muscle tone. She displays no seizure activity. Coordination normal.  Skin: Skin is warm and dry. No rash noted.  Psychiatric: She has a normal mood and affect.  Nursing note and vitals reviewed.   ED Course  Procedures (including critical care time) Labs Review Labs Reviewed  URINALYSIS, ROUTINE W REFLEX MICROSCOPIC - Abnormal; Notable for the following:    Color, Urine AMBER (*)    APPearance TURBID (*)    Hgb urine dipstick LARGE (*)    Protein, ur 30 (*)    Leukocytes, UA SMALL (*)  All other components within normal limits  CBC WITH DIFFERENTIAL - Abnormal; Notable for the following:    RBC 3.86 (*)    MCV 103.6 (*)    All other components within normal limits  COMPREHENSIVE METABOLIC PANEL - Abnormal; Notable for the following:    Sodium 131 (*)    Glucose, Bld 121 (*)    GFR calc non Af Amer 68 (*)    GFR calc Af Amer 78 (*)    All other components within normal limits  URINE MICROSCOPIC-ADD ON - Abnormal; Notable for the following:    Bacteria, UA MANY (*)    Crystals URIC ACID CRYSTALS (*)    All other components within normal limits  URINE CULTURE  LIPASE, BLOOD    Medications  0.9 %  sodium chloride infusion (0 mLs Intravenous Stopped 01/18/14 0810)    Followed by  0.9 %  sodium chloride infusion (not administered)  HYDROmorphone (DILAUDID) injection 1 mg (1 mg Intravenous Given 01/18/14 0716)  ondansetron (ZOFRAN) injection 4 mg (4 mg Intravenous Given 01/18/14 0716)     MDM   Final diagnoses:  Renal colic   Pain improved with treatment in the ED.  Pt is comfortable and ready to go home.  Symptoms, exam and findings are consistent with a recurrent kidney stone.  Pt does have bacteria and wbc in the urine.  Will start on cipro.  Dc home with pain meds.  Follow up with her urologist this week.   Return for fever, recurrent pain.  Dc home with urine strainer, flomax, cipro, zofran and oxycodone.     Dorie Rank, MD 01/18/14 [phone]

## 2014-01-18 NOTE — ED Notes (Addendum)
C/o left flank pain that started suddenly around 3am. States hx of kidney stone and took flomax and hydromorphone 4mg  around 3am with little relief. C/o nausea denies vomiting. Denies fevers. Pt unable to lay still. Only urinating small amounts per patient. desbribes as sharp and constant

## 2014-01-21 LAB — URINE CULTURE: Colony Count: >=100000

## 2014-01-22 ENCOUNTER — Telehealth (HOSPITAL_COMMUNITY): Payer: Self-pay

## 2014-01-22 NOTE — Telephone Encounter (Signed)
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  Dortha Kern 01/22/2014, 1:25 PM

## 2014-03-04 NOTE — Progress Notes (Signed)
Quick Note:  I reviewed the patient's AutoPap compliance data from 01/14/2014 through 02/12/2014 which is a total of 30 days during which time she used her machine every night with percent used days greater than 4 hours of 97%, indicating excellent compliance with an average usage of 7 hours and 51 minutes. Residual AHI 3 per hour which is acceptable. Leak low with the 95th percentile at 9 L/m. Pressure at the 95th percentile at 8.9. Pressure setting at 5-12 cm. I would like to review this data with the patient during a follow-up appointment which is currently not scheduled. Please get in touch with patient for follow-up appointment sleep clinic with me. Star Age, MD, PhD [facility] ([facility])  ______

## 2014-04-13 ENCOUNTER — Encounter: Payer: Self-pay | Admitting: Neurology

## 2014-05-07 ENCOUNTER — Other Ambulatory Visit (HOSPITAL_COMMUNITY): Payer: Self-pay | Admitting: Obstetrics and Gynecology

## 2014-05-08 LAB — CYTOLOGY - PAP

## 2014-09-22 ENCOUNTER — Telehealth: Payer: Self-pay | Admitting: Neurology

## 2014-10-05 NOTE — Telephone Encounter (Signed)
error 

## 2014-10-13 ENCOUNTER — Ambulatory Visit (INDEPENDENT_AMBULATORY_CARE_PROVIDER_SITE_OTHER): Payer: 59 | Admitting: Neurology

## 2014-10-13 ENCOUNTER — Encounter: Payer: Self-pay | Admitting: Neurology

## 2014-10-13 VITALS — BP 112/78 | HR 76 | Resp 16 | Ht 68.0 in | Wt 186.0 lb

## 2014-10-13 DIAGNOSIS — Z658 Other specified problems related to psychosocial circumstances: Secondary | ICD-10-CM | POA: Diagnosis not present

## 2014-10-13 DIAGNOSIS — G250 Essential tremor: Secondary | ICD-10-CM | POA: Diagnosis not present

## 2014-10-13 DIAGNOSIS — Z9989 Dependence on other enabling machines and devices: Principal | ICD-10-CM

## 2014-10-13 DIAGNOSIS — G4733 Obstructive sleep apnea (adult) (pediatric): Secondary | ICD-10-CM

## 2014-10-13 DIAGNOSIS — F439 Reaction to severe stress, unspecified: Secondary | ICD-10-CM

## 2014-10-13 NOTE — Patient Instructions (Signed)

## 2014-10-13 NOTE — Progress Notes (Signed)
Subjective:    Patient ID: Cynthia Mccullough is a 64 y.o. female.  HPI     Interim history:   Cynthia Mccullough is a 64 year old right-handed woman with an underlying medical history of hypertension, ex-smoker, essential tremor, allergic rhinitis and asthma, kidney stones, hyperlipidemia, overweight state, depression, and anxiety, who presents for follow-up consultation of her sleep disorder, in particular her obstructive sleep apnea, treated with AutoPap. The patient is unaccompanied today. I first met her on 10/15/2013 at the request of her psychiatrist, at which time the patient reported excessive daytime somnolence. I invited her back for sleep study. She had a home sleep test on 12/09/2013, which showed a total AHI of 6.4 per hour, and lowest oxygen saturation was 82%. Based on her test results and sleep related complaints I invited her back for a full night CPAP titration study, however, her insurance denied this. I then placed her on AutoPap therapy at home. She did not return for follow-up until now.   Today, 10/13/2014: I reviewed her AutoPap compliance data from 09/11/2014 through 10/10/2014 which is a total of 30 days during which time she used her machine every night except for one with percent used days greater than 4 hours at 93%, indicating excellent compliance with an average usage of 7 hours and 12 minutes, residual AHI low at 1.2 per hour, 95th percentile at 9 cm, leak low with the 95th percentile at 9.2 L/m. Pressure setting of 5 cm to 12 cm with EPR.  Today, 10/13/2014: She reports doing okay, lot's of stress with her husband's health, her daughter's recent separation, her 68 yo mother visiting from out of state. Per husband, she no longer snores, and she sleeps a little better at night, but she is tired and exhausted during the day. Her psychiatrist tried her on Provigil but it made her tremors worse so she stopped it after about a week.she has a family history of tremors her late  father, and her brother.   Previously:   10/15/2013: She reports EDS for some years and this has been worse in the last 2-3 years, especially since her husband's health issues. She has gained weight in the realm of 25-30 lb in the last 3 years. She snores, and has gasping sensations. She naps 1-2 times in the day. She has nocturia 1-2 per night. She goes to bed between 10-11 PM and usually takes 0.5 to 0.75 mg of Xanax. Her usual weight time is 9 to 9:30. She does not wake up rested and has had morning headaches. In the past she had migraines and these improved after menopause but recently in the past few months she had occasional migraine auras but was able to avoid a full-fledged migraine by taking 600 mg of ibuprofen and lying down to rest. Her husband has noted snoring. She has woken up with a gasping sensation. She grinds her teeth. She denies RLS symptoms. She talks in her sleep and had sleep walking once. Her ESS is 14/24.  She is not aware of any family history of OSA. She does recall that even as a child she was sleepier than her other siblings and she is one of 5 kids. She was the one that needed the most sleep.    Her Past Medical History Is Significant For: Past Medical History  Diagnosis Date  . HTN (hypertension)   . Anxiety   . Depression   . HLD (hyperlipidemia)   . Asthma   . Allergic rhinitis   .  Kidney stones   . Panic attacks   . Essential tremor     Her Past Surgical History Is Significant For: Past Surgical History  Procedure Laterality Date  . Cervical ablation    . Foot surgery    . Dilation and curettage of uterus    . Liposuction    . Excision morton's neuroma Right 2008  . Laparoscopic assisted vaginal hysterectomy N/A 04/23/2013    Procedure: LAPAROSCOPIC ASSISTED VAGINAL HYSTERECTOMY;  Surgeon: Cyril Mourning, MD;  Location: [facility];  Service: Gynecology;  Laterality: N/A;  . Salpingoophorectomy Bilateral 04/23/2013    Procedure: SALPINGO OOPHORECTOMY;   Surgeon: Cyril Mourning, MD;  Location: [facility];  Service: Gynecology;  Laterality: Bilateral;    Her Family History Is Significant For: Family History  Problem Relation Age of Onset  . Heart attack Father   . Coronary artery disease Father   . Hypertension Father   . Diabetes Father   . Cancer - Prostate Father   . Cancer - Other Maternal Grandmother     breast and bone  . Cancer - Other Maternal Grandfather     liver    Her Social History Is Significant For: Social History   Social History  . Marital Status: Married    Spouse Name: N/A  . Number of Children: 2  . 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 Smoking status: Current Some Day Smoker -- 14 years    Types: Cigarettes    Last Attempt to Quit: 04/22/1980  . Smokeless tobacco: None     Comment: occasional cigs/ day  . Alcohol Use: 1.2 oz/week    2 Glasses of wine per week     Comment: white  . Drug Use: No  . Sexual Activity: Not Asked   Other Topics Concern  . None   Social History Narrative   Right handed.  Caffeine none, Married, 2 kids.  1-2 yrs college.   Home maker.     Her Allergies Are:  Allergies  Allergen Reactions  . Metronidazole Hives  . Penicillins Anaphylaxis    REACTION: Urticaria/ angioedema  . Sulfonamide Derivatives Hives    REACTION: urticaria  . Amoxicillin Hives  . Atorvastatin Other (See Comments)  . Rosuvastatin Itching  :   Her Current Medications Are:  Outpatient Encounter Prescriptions as of 10/13/2014  Medication Sig  . ALPRAZolam (XANAX) 0.5 MG tablet Take 0.5 mg by mouth daily as needed for anxiety.   Marland Kitchen BYSTOLIC 10 MG tablet Take 10 mg by mouth daily.  . cetirizine (ZYRTEC) 10 MG tablet Take 10 mg by mouth daily.  . cloNIDine (CATAPRES) 0.2 MG tablet Take 0.2 mg by mouth daily.  Marland Kitchen desvenlafaxine (PRISTIQ) 50 MG 24 hr tablet Take 50 mg by mouth daily.   . DULoxetine (CYMBALTA) 30 MG capsule TK 1 C PO QAM  .  Estradiol (ELESTRIN) 0.52 MG/0.87 GM (0.06%) GEL Place onto the skin.  . famotidine (PEPCID AC) 10 MG tablet Take 10 mg by mouth daily as needed for heartburn or indigestion.  . fenofibrate 160 MG tablet Take 160 mg by mouth at bedtime.   . fish oil-omega-3 fatty acids 1000 MG capsule Take 1 g by mouth daily.   Marland Kitchen FLUoxetine (PROZAC) 20 MG capsule TK 1 C PO QD  . fluticasone (FLONASE) 50 MCG/ACT nasal spray Place 2 sprays into the nose daily.  . Plant Sterols and Stanols (CHOLESTOFF PLUS)  450 MG CAPS Take 1 capsule by mouth daily.  . valsartan (DIOVAN) 40 MG tablet Take 40 mg by mouth 2 (two) times daily.   . VENTOLIN HFA 108 (90 BASE) MCG/ACT inhaler Inhale 2 puffs into the lungs 4 (four) times daily as needed.  . [DISCONTINUED] ciprofloxacin (CIPRO) 500 MG tablet Take 1 tablet (500 mg total) by mouth 2 (two) times daily.  . [DISCONTINUED] FLUoxetine (PROZAC) 10 MG capsule Take 20 mg by mouth daily.   . [DISCONTINUED] ondansetron (ZOFRAN ODT) 8 MG disintegrating tablet Take 1 tablet (8 mg total) by mouth every 8 (eight) hours as needed for nausea or vomiting.  . [DISCONTINUED] oxyCODONE (OXY IR/ROXICODONE) 5 MG immediate release tablet Take 1-2 tablets (5-10 mg total) by mouth every 6 (six) hours as needed for severe pain.  . [DISCONTINUED] tamsulosin (FLOMAX) 0.4 MG CAPS capsule Take 1 capsule (0.4 mg total) by mouth daily.   No facility-administered encounter medications on file as of 10/13/2014.  :  Review of Systems:  Out of a complete 14 point review of systems, all are reviewed and negative with the exception of these symptoms as listed below:   Review of Systems  Neurological:       No new concerns. States that she is doing well on CPAP. Possible trouble with mask fitting.     Objective:  Neurologic Exam  Physical Exam Physical Examination:   Filed Vitals:   10/13/14 1303  BP: 112/78  Pulse: 76  Resp: 16    General Examination: The patient is a very pleasant 63 y.o.  female in no acute distress. She appears well-developed and well-nourished and well groomed.   HEENT: Normocephalic, atraumatic, pupils are equal, round and reactive to light and accommodation. Extraocular tracking is good without limitation to gaze excursion or nystagmus noted. Normal smooth pursuit is noted. Hearing is grossly intact. Tympanic membranes are clear bilaterally. Face is symmetric with normal facial animation and normal facial sensation. Speech is clear with no dysarthria noted. There is no hypophonia. There is no lip, or voice tremor. She does have a mild head tremor, no-no time. Neck is supple with full range of passive and active motion. There are no carotid bruits on auscultation. Oropharynx exam reveals: moderate mouth dryness, good dental hygiene and mild to moderate airway crowding, due to redundant soft palate and narrow airway entry. Mallampati is class III. Tongue protrudes centrally and palate elevates symmetrically. Tonsils are small. She has a slight voice tremor.   Chest: Clear to auscultation without wheezing, rhonchi or crackles noted.  Heart: S1+S2+0, regular and normal without murmurs, rubs or gallops noted.   Abdomen: Soft, non-tender and non-distended with normal bowel sounds appreciated on auscultation.  Extremities: There is no pitting edema in the distal lower extremities bilaterally. Pedal pulses are intact.  Skin: Warm and dry without trophic changes noted. There are no varicose veins.  Musculoskeletal: exam reveals no obvious joint deformities, tenderness or joint swelling or erythema.   Neurologically:  Mental status: The patient is awake, alert and oriented in all 4 spheres. Her immediate and remote memory, attention, language skills and fund of knowledge are appropriate. There is no evidence of aphasia, agnosia, apraxia or anomia. Speech is clear with normal prosody and enunciation. Thought process is linear. Mood is normal and affect is normal.  Cranial  nerves II - XII are as described above under HEENT exam. In addition: shoulder shrug is normal with equal shoulder height noted. Motor exam: Normal bulk, strength and tone is  noted. There is no drift, rest tremor or rebound. She has a very slight bilateral upper extremity postural and action tremor. Romberg is negative. Reflexes are 2+ throughout. Babinski: Toes are flexor bilaterally. Fine motor skills and coordination: intact with normal finger taps, normal hand movements, normal rapid alternating patting, normal foot taps and normal foot agility.  Cerebellar testing: No dysmetria or intention tremor on finger to nose testing. Heel to shin is unremarkable bilaterally. There is no truncal or gait ataxia.  Sensory exam: intact to light touch in the upper and lower extremities.  Gait, station and balance: She stands easily. No veering to one side is noted. No leaning to one side is noted. Posture is age-appropriate and stance is narrow based. Gait shows normal stride length and normal pace. No problems turning are noted. She turns en bloc. Tandem walk is difficult for her, unchanged. Intact toe and heel stance is noted.               Assessment and Plan:   In summary, SUNNIE ODDEN is a very pleasant 64 year old female with an underlying medical history of hypertension, ex-smoker, essential tremor, allergic rhinitis and asthma, kidney stones, hyperlipidemia, overweight state, depression, and anxiety, who  presents for follow-up consultation of her sleep disorder, now on treatment for obstructive sleep apnea with AutoPap. She has noticed some improvement of her sleep. She has a lot of stress. She has been compliant with CPAP therapy. We talked about her compliance data and she is congratulated on her treatment adherence. Her weight is stable. Exam is for the most part stable with perhaps a little bit worsening of her tremors noted today. She is advised to continue with AutoPap at the current settings. I  had a long chat with the patient about my findings and the diagnosis of OSA, its prognosis and treatment options. She is aware that she could lose some weight. She is trying but also admits that she is a stress eater. At this juncture, I have asked her to stay well-hydrated, work on her weight, and continue with AutoPap at the current settings. I will see her back in a year, sooner if needed. I answered all her questions today and she was in agreement.  I spent 25 minutes in total face-to-face time with the patient, more than 50% of which was spent in counseling and coordination of care, reviewing test results, reviewing medication and discussing or reviewing the diagnosis of OSA, its prognosis and treatment options.

## 2014-12-10 ENCOUNTER — Encounter: Payer: Self-pay | Admitting: Internal Medicine

## 2015-10-13 ENCOUNTER — Ambulatory Visit (INDEPENDENT_AMBULATORY_CARE_PROVIDER_SITE_OTHER): Payer: BLUE CROSS/BLUE SHIELD | Admitting: Neurology

## 2015-10-13 ENCOUNTER — Encounter: Payer: Self-pay | Admitting: Neurology

## 2015-10-13 VITALS — BP 112/68 | HR 72 | Resp 16 | Ht 68.0 in | Wt 195.0 lb

## 2015-10-13 DIAGNOSIS — R635 Abnormal weight gain: Secondary | ICD-10-CM | POA: Diagnosis not present

## 2015-10-13 DIAGNOSIS — G4733 Obstructive sleep apnea (adult) (pediatric): Secondary | ICD-10-CM | POA: Diagnosis not present

## 2015-10-13 DIAGNOSIS — G250 Essential tremor: Secondary | ICD-10-CM

## 2015-10-13 DIAGNOSIS — Z9989 Dependence on other enabling machines and devices: Secondary | ICD-10-CM | POA: Diagnosis not present

## 2015-10-13 NOTE — Patient Instructions (Signed)
Keep up the good work! I will see you back in one year for sleep apnea.   Please increase your water intake and your walking exercises.   Try to lose weight.

## 2015-10-13 NOTE — Progress Notes (Signed)
Subjective:    Patient ID: Cynthia Mccullough is a 65 y.o. female.  HPI     Interim history:   [name] is a 65 year old right-handed woman with an underlying medical history of hypertension, prior smoking, essential tremor, allergic rhinitis, asthma, kidney stones, hyperlipidemia, overweight state, depression, and anxiety, who presents for follow-up consultation of her obstructive sleep apnea, treated with AutoPap. The patient is unaccompanied today. I last saw her on 10/13/2014, at which time she reported doing okay, had some stressors but was doing well with AutoPap. She felt she was sleeping a little better. She had tried Provigil through her psychiatrist but it exacerbated her tremors so she stopped it. She was compliant with AutoPap therapy and since she was doing well I suggested a one-year checkup.   Today, 10/13/2015: I reviewed her AutoPap compliance data from 09/12/2015 through 10/11/2015, which is a total of 30 days, during which time she used her machine 28 days with percent used days greater than 4 hours at 87%, indicating very good compliance, with an average usage of 7 hours and 59 minutes, residual AHI of 1.7 per hour, leak low with the 95th percentile at 5.9 L/m, 95th percentile pressure at 10.5 cm, range of 5-12 cm, EPR of 2.  Today, 10/13/2015: She reports doing well, no longer on Pristiq, or Cymbalta or Prozac, and now on Viibryd, which has done really well for her and she feels good. She now has a dog. 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 Daughter going through divorce and will be moving to [place]. Drinks alcohol, one to 2 per evening. She does not drink caffeine. Does not exercise daily, gained about 9 lb.    Previously:    I first met her on 10/15/2013 at the request of her psychiatrist, at which time the patient reported excessive daytime somnolence. I invited her back for sleep study. She had a home sleep test on 12/09/2013, which showed a total AHI of 6.4 per hour, and  lowest oxygen saturation was 82%. Based on her test results and sleep related complaints I invited her back for a full night CPAP titration study, however, her insurance denied this. I then placed her on AutoPap therapy at home. She did not return for follow-up until now.    I reviewed her AutoPap compliance data from 09/11/2014 through 10/10/2014 which is a total of 30 days during which time she used her machine every night except for one with percent used days greater than 4 hours at 93%, indicating excellent compliance with an average usage of 7 hours and 12 minutes, residual AHI low at 1.2 per hour, 95th percentile at 9 cm, leak low with the 95th percentile at 9.2 L/m. Pressure setting of 5 cm to 12 cm with EPR.   10/15/2013: She reports EDS for some years and this has been worse in the last 2-3 years, especially since her husband's health issues. She has gained weight in the realm of 25-30 lb in the last 3 years. She snores, and has gasping sensations. She naps 1-2 times in the day. She has nocturia 1-2 per night. She goes to bed between 10-11 PM and usually takes 0.5 to 0.75 mg of Xanax. Her usual weight time is 9 to 9:30. She does not wake up rested and has had morning headaches. In the past she had migraines and these improved after menopause but recently in the past few months she had occasional migraine auras but was able to avoid a full-fledged migraine by taking 600  mg of ibuprofen and lying down to rest. Her husband has noted snoring. She has woken up with a gasping sensation. She grinds her teeth. She denies RLS symptoms. She talks in her sleep and had sleep walking once. Her ESS is 14/24.  She is not aware of any family history of OSA. She does recall that even as a child she was sleepier than her other siblings and she is one of 5 kids. She was the one that needed the most sleep.   Her Past Medical History Is Significant For: Past Medical History:  Diagnosis Date  . Allergic rhinitis   .  Anxiety   . Asthma   . Depression   . Essential tremor   . HLD (hyperlipidemia)   . HTN (hypertension)   . Kidney stones   . Panic attacks     Her Past Surgical History Is Significant For: Past Surgical History:  Procedure Laterality Date  . CERVICAL ABLATION    . DILATION AND CURETTAGE OF UTERUS    . EXCISION MORTON'S NEUROMA Right 2008  . FOOT SURGERY    . LAPAROSCOPIC ASSISTED VAGINAL HYSTERECTOMY N/A 04/23/2013   Procedure: LAPAROSCOPIC ASSISTED VAGINAL HYSTERECTOMY;  Surgeon: Jeani Hawking, MD;  Location: [facility];  Service: Gynecology;  Laterality: N/A;  . LIPOSUCTION    . SALPINGOOPHORECTOMY Bilateral 04/23/2013   Procedure: SALPINGO OOPHORECTOMY;  Surgeon: Jeani Hawking, MD;  Location: [facility];  Service: Gynecology;  Laterality: Bilateral;    Her Family History Is Significant For: Family History  Problem Relation Age of Onset  . Heart attack Father   . Coronary artery disease Father   . Hypertension Father   . Diabetes Father   . Cancer - Prostate Father   . Cancer - Other Maternal Grandmother     breast and bone  . Cancer - Other Maternal Grandfather     liver    Her Social History Is Significant For: Social History   Social History  . Marital status: Married    Spouse name: N/A  . Number of children: 2  . 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 Smoking status: Current Some Day Smoker    Years: 14.00    Types: Cigarettes    Last attempt to quit: 04/22/1980  . Smokeless tobacco: None     Comment: occasional cigs/ day  . Alcohol use 1.2 oz/week    2 Glasses of wine per week     Comment: white  . Drug use: No  . Sexual activity: Not Asked   Other Topics Concern  . None   Social History Narrative   Right handed.  Drinks 1 cup of coffee a day, Married, 2 kids.  1-2 yrs college.   Home maker.     Her Allergies Are:  Allergies  Allergen Reactions  . Metronidazole Hives  . Penicillins  Anaphylaxis    REACTION: Urticaria/ angioedema  . Sulfonamide Derivatives Hives    REACTION: urticaria  . Amoxicillin Hives  . Atorvastatin Other (See Comments)  . Rosuvastatin Itching  :   Her Current Medications Are:  Outpatient Encounter Prescriptions as of 10/13/2015  Medication Sig  . ALPRAZolam (XANAX) 0.5 MG tablet Take 0.5 mg by mouth daily as needed for anxiety.   Marland Kitchen BYSTOLIC 10 MG tablet Take 10 mg by mouth daily.  . cetirizine (ZYRTEC) 10 MG tablet Take 10 mg by mouth daily.  . cloNIDine (CATAPRES) 0.2  MG tablet Take 0.2 mg by mouth daily.  . famotidine (PEPCID AC) 10 MG tablet Take 10 mg by mouth daily as needed for heartburn or indigestion.  . fenofibrate 160 MG tablet Take 160 mg by mouth at bedtime.   . fish oil-omega-3 fatty acids 1000 MG capsule Take 1 g by mouth daily.   . fluticasone (FLONASE) 50 MCG/ACT nasal spray Place 2 sprays into the nose daily.  . valsartan (DIOVAN) 40 MG tablet Take 40 mg by mouth 2 (two) times daily.   . VENTOLIN HFA 108 (90 BASE) MCG/ACT inhaler Inhale 2 puffs into the lungs 4 (four) times daily as needed.  Marland Kitchen VIIBRYD 40 MG TABS TK 1 T PO QD  . [DISCONTINUED] desvenlafaxine (PRISTIQ) 50 MG 24 hr tablet Take 50 mg by mouth daily.   . [DISCONTINUED] DULoxetine (CYMBALTA) 30 MG capsule TK 1 C PO QAM  . [DISCONTINUED] Estradiol (ELESTRIN) 0.52 MG/0.87 GM (0.06%) GEL Place onto the skin.  . [DISCONTINUED] FLUoxetine (PROZAC) 20 MG capsule TK 1 C PO QD  . [DISCONTINUED] Plant Sterols and Stanols (CHOLESTOFF PLUS) 450 MG CAPS Take 1 capsule by mouth daily.   No facility-administered encounter medications on file as of 10/13/2015.   :  Review of Systems:  Out of a complete 14 point review of systems, all are reviewed and negative with the exception of these symptoms as listed below: Review of Systems  Neurological:       Patient states that she is doing well with CPAP. No new concerns.     Objective:  Neurologic Exam  Physical  Exam Physical Examination:   Vitals:   10/13/15 1307  BP: 112/68  Pulse: 72  Resp: 16   General Examination: The patient is a very pleasant 65 y.o. female in no acute distress. She appears well-developed and well-nourished and well groomed. She is in good spirits today.   HEENT: Normocephalic, atraumatic, pupils are equal, round and reactive to light and accommodation. Tiny cataracts. Extraocular tracking is good without limitation to gaze excursion or nystagmus noted. Normal smooth pursuit is noted. Hearing is grossly intact. Face is symmetric with normal facial animation and normal facial sensation. Speech is clear with no dysarthria noted. There is no hypophonia. There is no lip, or voice tremor. She does have a mild head tremor, no-no type, stable. Neck is supple with full range of passive and active motion. There are no carotid bruits on auscultation. Oropharynx exam reveals: moderate mouth dryness, good dental hygiene and mild to moderate airway crowding, due to redundant soft palate and narrow airway entry. Mallampati is class III. Tongue protrudes centrally and palate elevates symmetrically. Tonsils are small. She has a slight voice tremor.   Chest: Clear to auscultation without wheezing, rhonchi or crackles noted.  Heart: S1+S2+0, regular and normal without murmurs, rubs or gallops noted.   Abdomen: Soft, non-tender and non-distended with normal bowel sounds appreciated on auscultation.  Extremities: There is no pitting edema in the distal lower extremities bilaterally. Pedal pulses are intact.  Skin: Warm and dry without trophic changes noted. There are no varicose veins.  Musculoskeletal: exam reveals no obvious joint deformities, tenderness or joint swelling or erythema.   Neurologically:  Mental status: The patient is awake, alert and oriented in all 4 spheres. Her immediate and remote memory, attention, language skills and fund of knowledge are appropriate. There is no  evidence of aphasia, agnosia, apraxia or anomia. Speech is clear with normal prosody and enunciation. Thought process is linear. Mood  is normal and affect is normal.  Cranial nerves II - XII are as described above under HEENT exam. In addition: shoulder shrug is normal with equal shoulder height noted. Motor exam: Normal bulk, strength and tone is noted. There is no drift, rest tremor or rebound. She has a no upper extremity postural and action tremor today. Romberg shows mild sway. Reflexes are 2+ throughout. Babinski: Toes are flexor bilaterally. Fine motor skills and coordination: intact with normal finger taps, normal hand movements, normal rapid alternating patting, normal foot taps and normal foot agility.  Cerebellar testing: No dysmetria or intention tremor on finger to nose testing. Heel to shin is unremarkable bilaterally. There is no truncal or gait ataxia.  Sensory exam: intact to light touch in the upper and lower extremities.  Gait, station and balance: She stands easily. No veering to one side is noted. No leaning to one side is noted. Posture is age-appropriate and stance is narrow based. Gait shows normal stride length and normal pace. No problems turning are noted. Tandem walk is difficult for her, worse than last time.            Assessment and Plan:   In summary, LATANZA PFEFFERKORN is a very pleasant 65 year old female with an underlying medical history of hypertension, ex-smoker, essential tremor, allergic rhinitis and asthma, kidney stones, hyperlipidemia, overweight state, depression, and anxiety, who  presents for follow-up consultation of her obstructive sleep apnea, on treatment with AutoPap. She is compliant with treatment and has ongoing good results. In fact, she reports that when she skips treatment for a day or 2 she does not sleep well and often wakes up with a very dry mouth due to mouth breathing. Physical exam is stable with the exception of weight gain in the past year.  Mood wise she is doing much better, now on Viibryd per psychiatrist, Dr. Toy Care. Her tremor is stable, mostly her head tremor, no significant evidence of hand tremors. She does have a family history of essential tremor. Thankfully, her husband is doing better as well. Her 2 children are settling down and/or moving on, so stress level is less.  She is advised to continue with AutoPap at the current settings and she is commended for treatment adherence. I advised her to try to drink more water, exercise more in the form of walking exercises as her balance is not great. In addition, she is advised to limit her alcohol intake and avoid caffeine because of exacerbation of tremors, she is reminded to try to work on weight loss. I will see her back in a year, sooner if needed. I answered all her questions today and she was in agreement.  I spent 25 minutes in total face-to-face time with the patient, more than 50% of which was spent in counseling and coordination of care, reviewing test results, reviewing medication and discussing or reviewing the diagnosis of OSA, its prognosis and treatment options.

## 2016-10-18 ENCOUNTER — Ambulatory Visit: Payer: BLUE CROSS/BLUE SHIELD | Admitting: Neurology

## 2016-10-25 ENCOUNTER — Ambulatory Visit (INDEPENDENT_AMBULATORY_CARE_PROVIDER_SITE_OTHER): Payer: BLUE CROSS/BLUE SHIELD | Admitting: Neurology

## 2016-10-25 ENCOUNTER — Encounter (INDEPENDENT_AMBULATORY_CARE_PROVIDER_SITE_OTHER): Payer: Self-pay

## 2016-10-25 ENCOUNTER — Encounter: Payer: Self-pay | Admitting: Neurology

## 2016-10-25 VITALS — BP 113/76 | HR 77 | Ht 68.0 in | Wt 210.0 lb

## 2016-10-25 DIAGNOSIS — G4733 Obstructive sleep apnea (adult) (pediatric): Secondary | ICD-10-CM | POA: Diagnosis not present

## 2016-10-25 DIAGNOSIS — Z9989 Dependence on other enabling machines and devices: Secondary | ICD-10-CM

## 2016-10-25 NOTE — Progress Notes (Signed)
Subjective:    Patient ID: Cynthia Mccullough is a 66 y.o. female.  HPI     Interim history:   Cynthia Mccullough is a 66 year old right-handed woman with an underlying medical history of hypertension, prior smoking, essential tremor, allergic rhinitis, asthma, kidney stones, hyperlipidemia, overweight state, depression, and anxiety, who presents for follow-up consultation of her obstructive sleep apnea, treated with AutoPap. The patient is unaccompanied today. I last saw her on 10/13/2015, at which time Cynthia Mccullough was doing well, compliant with treatment. I suggested a one-year checkup.  Today, 10/25/2016: I reviewed her CPAP compliance data from 09/24/2016 through 10/23/2016 which is a total of 30 days, during which time Cynthia Mccullough used her machine 24 days with percent used days greater than 4 hours at 80%, indicating very good compliance with an average usage of 9 hours and 4 minutes, residual AHI at goal at 2.1 per hour, 95th percentile pressure at 10.6 cm, leak acceptable with the 95th percentile at 9 L/m on a pressure range of 5-12 cm with EPR. Maximum pressure of 11.4 cm. Cynthia Mccullough reports having had recent issues with joint pain. Cynthia Mccullough has started seeing a rheumatologist. Cynthia Mccullough has had more nasal congestion. Cynthia Mccullough has a hx of allergies, uses Zyrtec, but has not changed the nasal prongs in some time and has not been using her Flonase either. Has had significant increase in stress what with her husband's illness. Thankfully, Cynthia Mccullough has a good support system.    Previously:   I saw her on 10/13/2014, at which time Cynthia Mccullough reported doing okay, had some stressors but was doing well with AutoPap. Cynthia Mccullough felt Cynthia Mccullough was sleeping a little better. Cynthia Mccullough had tried Provigil through her psychiatrist but it exacerbated her tremors so Cynthia Mccullough stopped it. Cynthia Mccullough was compliant with AutoPap therapy and since Cynthia Mccullough was doing well I suggested a one-year checkup.    I reviewed her AutoPap compliance data from 09/12/2015 through 10/11/2015, which is a total of  30 days, during which time Cynthia Mccullough used her machine 28 days with percent used days greater than 4 hours at 87%, indicating very good compliance, with an average usage of 7 hours and 59 minutes, residual AHI of 1.7 per hour, leak low with the 95th percentile at 5.9 L/m, 95th percentile pressure at 10.5 cm, range of 5-12 cm, EPR of 2.    I first met her on 10/15/2013 at the request of her psychiatrist, at which time the patient reported excessive daytime somnolence. I invited her back for sleep study. Cynthia Mccullough had a home sleep test on 12/09/2013, which showed a total AHI of 6.4 per hour, and lowest oxygen saturation was 82%. Based on her test results and sleep related complaints I invited her back for a full night CPAP titration study, however, her insurance denied this. I then placed her on AutoPap therapy at home. Cynthia Mccullough did not return for follow-up until now.    I reviewed her AutoPap compliance data from 09/11/2014 through 10/10/2014 which is a total of 30 days during which time Cynthia Mccullough used her machine every night except for one with percent used days greater than 4 hours at 93%, indicating excellent compliance with an average usage of 7 hours and 12 minutes, residual AHI low at 1.2 per hour, 95th percentile at 9 cm, leak low with the 95th percentile at 9.2 L/m. Pressure setting of 5 cm to 12 cm with EPR.   10/15/2013: Cynthia Mccullough reports EDS for some years and this has been worse in the last 2-3 years, especially since  her husband's health issues. Cynthia Mccullough has gained weight in the realm of 25-30 lb in the last 3 years. Cynthia Mccullough snores, and has gasping sensations. Cynthia Mccullough naps 1-2 times in the day. Cynthia Mccullough has nocturia 1-2 per night. Cynthia Mccullough goes to bed between 10-11 PM and usually takes 0.5 to 0.75 mg of Xanax. Her usual weight time is 9 to 9:30. Cynthia Mccullough does not wake up rested and has had morning headaches. In the past Cynthia Mccullough had migraines and these improved after menopause but recently in the past few months Cynthia Mccullough had occasional migraine auras but was able  to avoid a full-fledged migraine by taking 600 mg of ibuprofen and lying down to rest. Her husband has noted snoring. Cynthia Mccullough has woken up with a gasping sensation. Cynthia Mccullough grinds her teeth. Cynthia Mccullough denies RLS symptoms. Cynthia Mccullough talks in her sleep and had sleep walking once. Her ESS is 14/24.  Cynthia Mccullough is not aware of any family history of OSA. Cynthia Mccullough does recall that even as a child Cynthia Mccullough was sleepier than her other siblings and Cynthia Mccullough is one of 5 kids. Cynthia Mccullough was the one that needed the most sleep.   Her Past Medical History Is Significant For: Past Medical History:  Diagnosis Date  . Allergic rhinitis   . Anxiety   . Asthma   . Depression   . Essential tremor   . HLD (hyperlipidemia)   . HTN (hypertension)   . Kidney stones   . Panic attacks     Her Past Surgical History Is Significant For: Past Surgical History:  Procedure Laterality Date  . CERVICAL ABLATION    . DILATION AND CURETTAGE OF UTERUS    . EXCISION MORTON'S NEUROMA Right 2008  . FOOT SURGERY    . LAPAROSCOPIC ASSISTED VAGINAL HYSTERECTOMY N/A 04/23/2013   Procedure: LAPAROSCOPIC ASSISTED VAGINAL HYSTERECTOMY;  Surgeon: Cyril Mourning, MD;  Location: [facility];  Service: Gynecology;  Laterality: N/A;  . LIPOSUCTION    . SALPINGOOPHORECTOMY Bilateral 04/23/2013   Procedure: SALPINGO OOPHORECTOMY;  Surgeon: Cyril Mourning, MD;  Location: [facility];  Service: Gynecology;  Laterality: Bilateral;    Her Family History Is Significant For: Family History  Problem Relation Age of Onset  . Heart attack Father   . Coronary artery disease Father   . Hypertension Father   . Diabetes Father   . Cancer - Prostate Father   . Cancer - Other Maternal Grandmother        breast and bone  . Cancer - Other Maternal Grandfather        liver    Her Social History Is Significant For: Social History   Social History  . Marital status: Married    Spouse name: N/A  . Number of children: 2  . 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 Smoking status: Current Some Day Smoker    Years: 14.00    Types: Cigarettes    Last attempt to quit: 04/22/1980  . Smokeless tobacco: Never Used     Comment: occasional cigs/ day  . Alcohol use 1.2 oz/week    2 Glasses of wine per week     Comment: white  . Drug use: No  . Sexual activity: Not Asked   Other Topics Concern  . None   Social History Narrative   Right handed.  Drinks 1 cup of coffee a day, Married, 2 kids.  1-2 yrs college.   Home maker.  Her Allergies Are:  Allergies  Allergen Reactions  . Metronidazole Hives  . Penicillins Anaphylaxis    REACTION: Urticaria/ angioedema  . Sulfonamide Derivatives Hives    REACTION: urticaria  . Amoxicillin Hives  . Atorvastatin Other (See Comments)  . Rosuvastatin Itching  :   Her Current Medications Are:  Outpatient Encounter Prescriptions as of 10/25/2016  Medication Sig  . ALPRAZolam (XANAX) 0.5 MG tablet Take 0.5 mg by mouth daily as needed for anxiety.   Marland Kitchen BYSTOLIC 10 MG tablet Take 10 mg by mouth daily.  . cetirizine (ZYRTEC) 10 MG tablet Take 10 mg by mouth daily.  . cloNIDine (CATAPRES) 0.2 MG tablet Take 0.2 mg by mouth daily.  . famotidine (PEPCID AC) 10 MG tablet Take 10 mg by mouth daily as needed for heartburn or indigestion.  . fenofibrate 160 MG tablet Take 160 mg by mouth at bedtime.   . fluticasone (FLONASE) 50 MCG/ACT nasal spray Place 2 sprays into the nose daily.  Marland Kitchen olmesartan (BENICAR) 20 MG tablet Take 20 mg by mouth daily.  . pantoprazole (PROTONIX) 40 MG tablet Take 40 mg by mouth daily.  Marland Kitchen UNABLE TO FIND Med Name: Thyroid Med  . VENTOLIN HFA 108 (90 BASE) MCG/ACT inhaler Inhale 2 puffs into the lungs 4 (four) times daily as needed.  Marland Kitchen VIIBRYD 40 MG TABS TK 1 T PO QD  . [DISCONTINUED] fish oil-omega-3 fatty acids 1000 MG capsule Take 1 g by mouth daily.   . [DISCONTINUED] valsartan (DIOVAN) 40 MG tablet Take 40 mg by mouth 2 (two) times daily.    No  facility-administered encounter medications on file as of 10/25/2016.   :  Review of Systems:  Out of a complete 14 point review of systems, all are reviewed and negative with the exception of these symptoms as listed below: Review of Systems  Neurological:       Pt presents today to discuss her cpap. Pt reports that her cpap is going well. However, pt has developed significant pain in her legs and joints for which Cynthia Mccullough is seeing a rheumatologist. Pt is also waking up with a stuffy nose and is wondering if that could be coming from the cpap.    Objective:  Neurological Exam  Physical Exam Physical Examination:   Vitals:   10/25/16 1416  BP: 113/76  Pulse: 77   General Examination: The patient is a very pleasant 66 y.o. female in no acute distress. Cynthia Mccullough appears well-developed and well-nourished and well groomed.   HEENT: Normocephalic, atraumatic, pupils are equal, round and reactive to light and accommodation. Extraocular tracking is good without limitation to gaze excursion or nystagmus noted. Normal smooth pursuit is noted. Hearing is grossly intact. Face is symmetric with normal facial animation and normal facial sensation. Speech is clear with no dysarthria noted. There is no hypophonia. There is no lip, or voice tremor. Cynthia Mccullough does have a mild head tremor, no-no type, stable from last year too. Neck is supple with full range of passive and active motion. There are no carotid bruits on auscultation. Oropharynx exam reveals: mild mouth dryness, good dental hygiene and mild to moderate airway crowding. Tongue protrudes centrally and palate elevates symmetrically. Tonsils are small. Cynthia Mccullough has a slight voice tremor, also stable.   Chest: Clear to auscultation without wheezing, rhonchi or crackles noted.  Heart: S1+S2+0, regular and normal without murmurs, rubs or gallops noted.   Abdomen: Soft, non-tender and non-distended with normal bowel sounds appreciated on  auscultation.  Extremities: There  is no pitting edema in the distal lower extremities bilaterally. Pedal pulses are intact.  Skin: Warm and dry without trophic changes noted. There are no varicose veins.  Musculoskeletal: exam reveals no obvious joint deformities, tenderness or joint swelling or erythema.   Neurologically:  Mental status: The patient is awake, alert and oriented in all 4 spheres. Her immediate and remote memory, attention, language skills and fund of knowledge are appropriate. There is no evidence of aphasia, agnosia, apraxia or anomia. Speech is clear with normal prosody and enunciation. Thought process is linear. Mood is normal and affect is normal.  Cranial nerves II - XII are as described above under HEENT exam. In addition: shoulder shrug is normal with equal shoulder height noted. Motor exam: Normal bulk, strength and tone is noted. There is no drift, rest tremor or rebound. Cynthia Mccullough has a no upper extremity postural and action tremor today. Romberg shows mild sway. Reflexes are 1+ throughout. Fine motor skills and coordination: intact with normal finger taps, normal hand movements, normal rapid alternating patting, normal foot taps and normal foot agility.  Cerebellar testing: No dysmetria or intention tremor. There is no truncal or gait ataxia.  Sensory exam: intact to light touch in the upper and lower extremities.  Gait, station and balance: Cynthia Mccullough stands easily. No veering to one side is noted. No leaning to one side is noted. Posture is age-appropriate and stance is narrow based. Gait shows normal stride length and normal pace. No problems turning are noted.   Assessment and Plan:   In summary, SHANNON BALTHAZAR is a very pleasant 66 year old female with an underlying medical history of hypertension, ex-smoker, essential tremor, allergic rhinitis and asthma, kidney stones, hyperlipidemia, overweight state, depression, and anxiety, who presents for follow-up consultation  of her mild obstructive sleep apnea, on treatment with AutoPap. Cynthia Mccullough is compliant with treatment and has had ongoing good results. Unfortunately, Cynthia Mccullough has had an increase in stress. Cynthia Mccullough has had also increase in allergy symptoms but admits that Cynthia Mccullough has not been using her Flonase. In addition, Cynthia Mccullough may not have been very good with exchanging her filters and changing the nasal prongs. Cynthia Mccullough will do better with this. Cynthia Mccullough also has had some joint pain and aching recently, has started seeing a rheumatologist and workup is underway. From my end of things I suggested a one-year checkup. I renewed an order for CPAP related supplies. Thankfully, Cynthia Mccullough has a good support system. I will see her back in a year, sooner if needed. I answered all her questions today and Cynthia Mccullough was in agreement.  I spent 20 minutes in total face-to-face time with the patient, more than 50% of which was spent in counseling and coordination of care, reviewing test results, reviewing medication and discussing or reviewing the diagnosis of OSA, its prognosis and treatment options. Pertinent laboratory and imaging test results that were available during this visit with the patient were reviewed by me and considered in my medical decision making (see chart for details).

## 2016-10-25 NOTE — Patient Instructions (Addendum)
Keep up the good work!  Please continue using your autoPAP regularly. While your insurance requires that you use PAP at least 4 hours each night on 70% of the nights, I recommend, that you not skip any nights and use it throughout the night if you can. Getting used to PAP and staying with the treatment long term does take time and patience and discipline. Untreated obstructive sleep apnea when it is moderate to severe can have an adverse impact on cardiovascular health and raise her risk for heart disease, arrhythmias, hypertension, congestive heart failure, stroke and diabetes. Untreated obstructive sleep apnea causes sleep disruption, nonrestorative sleep, and sleep deprivation. This can have an impact on your day to day functioning and cause daytime sleepiness and impairment of cognitive function, memory loss, mood disturbance, and problems focussing. Using PAP regularly can improve these symptoms.

## 2016-12-09 ENCOUNTER — Emergency Department (HOSPITAL_COMMUNITY)
Admission: EM | Admit: 2016-12-09 | Discharge: 2016-12-09 | Disposition: A | Discharge status: Home / Self Care | Payer: BLUE CROSS/BLUE SHIELD | Attending: Emergency Medicine | Admitting: Emergency Medicine

## 2016-12-09 ENCOUNTER — Other Ambulatory Visit: Payer: Self-pay

## 2016-12-09 ENCOUNTER — Emergency Department (HOSPITAL_COMMUNITY): Payer: BLUE CROSS/BLUE SHIELD

## 2016-12-09 ENCOUNTER — Encounter (HOSPITAL_COMMUNITY): Payer: Self-pay | Admitting: Emergency Medicine

## 2016-12-09 DIAGNOSIS — Z79899 Other long term (current) drug therapy: Secondary | ICD-10-CM | POA: Insufficient documentation

## 2016-12-09 DIAGNOSIS — R600 Localized edema: Secondary | ICD-10-CM | POA: Diagnosis not present

## 2016-12-09 DIAGNOSIS — F419 Anxiety disorder, unspecified: Secondary | ICD-10-CM | POA: Diagnosis not present

## 2016-12-09 DIAGNOSIS — J45909 Unspecified asthma, uncomplicated: Secondary | ICD-10-CM | POA: Insufficient documentation

## 2016-12-09 DIAGNOSIS — R0602 Shortness of breath: Secondary | ICD-10-CM | POA: Diagnosis present

## 2016-12-09 DIAGNOSIS — R609 Edema, unspecified: Secondary | ICD-10-CM

## 2016-12-09 DIAGNOSIS — I10 Essential (primary) hypertension: Secondary | ICD-10-CM | POA: Diagnosis not present

## 2016-12-09 DIAGNOSIS — F17210 Nicotine dependence, cigarettes, uncomplicated: Secondary | ICD-10-CM | POA: Diagnosis not present

## 2016-12-09 LAB — CBC
HEMATOCRIT: 39.6 % (ref 36.0–46.0)
HEMOGLOBIN: 13 g/dL (ref 12.0–15.0)
MCH: 33 pg (ref 26.0–34.0)
MCHC: 32.8 g/dL (ref 30.0–36.0)
MCV: 100.5 fL — AB (ref 78.0–100.0)
Platelets: 248 10*3/uL (ref 150–400)
RBC: 3.94 MIL/uL (ref 3.87–5.11)
RDW: 13.6 % (ref 11.5–15.5)
WBC: 4.9 10*3/uL (ref 4.0–10.5)

## 2016-12-09 LAB — I-STAT TROPONIN, ED
TROPONIN I, POC: 0.01 ng/mL (ref 0.00–0.08)
Troponin i, poc: 0 ng/mL (ref 0.00–0.08)

## 2016-12-09 LAB — BASIC METABOLIC PANEL
ANION GAP: 9 (ref 5–15)
BUN: 12 mg/dL (ref 6–20)
CO2: 26 mmol/L (ref 22–32)
Calcium: 9.3 mg/dL (ref 8.9–10.3)
Chloride: 101 mmol/L (ref 101–111)
Creatinine, Ser: 0.93 mg/dL (ref 0.44–1.00)
GFR calc Af Amer: >60 mL/min (ref >60)
GFR calc non Af Amer: >60 mL/min (ref >60)
GLUCOSE: 122 mg/dL — AB (ref 65–99)
Potassium: 4.3 mmol/L (ref 3.5–5.1)
Sodium: 136 mmol/L (ref 135–145)

## 2016-12-09 LAB — TSH: TSH: 1.825 u[IU]/mL (ref 0.350–4.500)

## 2016-12-09 LAB — BRAIN NATRIURETIC PEPTIDE: B Natriuretic Peptide: 107.5 pg/mL — ABNORMAL HIGH (ref 0.0–100.0)

## 2016-12-09 LAB — D-DIMER, QUANTITATIVE (NOT AT ARMC)

## 2016-12-09 MED ORDER — SODIUM CHLORIDE 0.9 % IV BOLUS (SEPSIS)
1000.0000 mL | Freq: Once | INTRAVENOUS | Status: AC
  Administered 2016-12-09: 1000 mL via INTRAVENOUS

## 2016-12-09 MED ORDER — ALBUTEROL SULFATE HFA 108 (90 BASE) MCG/ACT IN AERS: 2.0000 | INHALATION_SPRAY | RESPIRATORY_TRACT | 2 refills | Status: AC | PRN

## 2016-12-09 MED ORDER — ACETAMINOPHEN 500 MG PO TABS
1000.0000 mg | ORAL_TABLET | Freq: Once | ORAL | Status: AC
Start: 2016-12-09 — End: 2016-12-09
  Administered 2016-12-09: 1000 mg via ORAL
  Filled 2016-12-09: qty 2

## 2016-12-09 MED ORDER — LORAZEPAM 1 MG PO TABS
2.0000 mg | ORAL_TABLET | Freq: Once | ORAL | Status: AC
  Administered 2016-12-09: 2 mg via ORAL
  Filled 2016-12-09: qty 2

## 2016-12-09 MED ORDER — TRAZODONE HCL 100 MG PO TABS: 100.0000 mg | ORAL_TABLET | Freq: Every day | ORAL | 0 refills | Status: AC

## 2016-12-09 MED ORDER — POTASSIUM CHLORIDE ER 10 MEQ PO TBCR: 10.0000 meq | EXTENDED_RELEASE_TABLET | Freq: Two times a day (BID) | ORAL | 30 refills | Status: DC

## 2016-12-09 MED ORDER — FUROSEMIDE 20 MG PO TABS: 20.0000 mg | ORAL_TABLET | Freq: Every day | ORAL | 0 refills | Status: DC

## 2016-12-09 NOTE — Discharge Instructions (Signed)
Your TSH result (a measure of your thyroid function and medication) will be resulted tomorrow. You will be contacted if this is abnormal.  Prescription to restart her daily furosemide, and potassium.  Trazodone for sleep. This is also very good for anxiety. Continue your when necessary Xanax during the day.  Make an appointment with Dr. Robina Ade, your psychiatrist to discuss your anxiety, and the changes we are making with her medications.

## 2016-12-09 NOTE — ED Triage Notes (Signed)
Pt states she has been short of breath for past 4-5 days, pt states she has an inhaler, used 4-5 days ago, without relief-- also c/o chest pain and jaw pain started 2 days ago, radiating down left arm.  Pt c/o swelling feet x 4 months, has 2 + tibial pitting edema bilateral

## 2016-12-09 NOTE — ED Provider Notes (Signed)
[facility] DEPARTMENT Provider Note   CSN: [id number] Arrival date & time: 12/09/16  1122     History   Chief Complaint Chief Complaint  Patient presents with  . Shortness of Breath    HPI Cynthia Mccullough is a 66 y.o. female. Chief complaint is multiple complaints.  HPI: Patient is a 66 year old female who presents very anxious.  She cites almost 10 system related symptoms to me in the first minute as I entered the room. She is accompanied by her son, and a friend.  Her main complaint is swelling and shortness of breath. She states that she feels short of breath Mccullough the time. She has had some swelling in her legs. When asked about weight gain she relates a story of 50 pound weight gain over 5 years. Over several minutes time she is able to narrow this down thinks she may have gained 20 pounds over the last 2-3 months but is uncertain and nonspecific about her weight and trends. She has a headache today. Is been present for "I don't know how long". Last night she states she had some pain in her left arm and chest and radiated to her neck and lasted about 30 seconds at a time and was sharp. Has not felt that today. Feels tightness in her anterior chest that is her "almost Mccullough the time". So short of breath "Mccullough the time". She uses albuterol with no relief. Intermittent episodes of abdominal pain but none today. States her legs are tingling. She has seen her rheumatologist and primary care regarding "a lot of these" symptoms. Was placed on gabapentin by her rheumatologist for "pins and needles in my legs".  Does have dependent edema. Presented took Lasix and potassium. States "at one time I get dehydrated and they stopped it. She cannot recall when this may have been.  Recently found to be hypothyroid. She takes an unknown dose of an unknown supplement for her thyroid.  Is compliant with her blood pressure medications. Does not take amlodipine. No history of  DVT or PE. Distant history of smoking over 30 years ago.   Past Medical History:  Diagnosis Date  . Allergic rhinitis   . Anxiety   . Asthma   . Depression   . Essential tremor   . HLD (hyperlipidemia)   . HTN (hypertension)   . Kidney stones   . Panic attacks     Patient Active Problem List   Diagnosis Date Noted  . S/P laparoscopic assisted vaginal hysterectomy (LAVH) 04/23/2013  . ALLERGIC CONJUNCTIVITIS 03/26/2007  . HYPERTENSION 03/26/2007  . Seasonal and perennial allergic rhinitis 03/26/2007  . Asthma, intermittent 03/26/2007  . URTICARIA 03/26/2007    Past Surgical History:  Procedure Laterality Date  . CERVICAL ABLATION    . DILATION AND CURETTAGE OF UTERUS    . EXCISION MORTON'S NEUROMA Right 2008  . FOOT SURGERY    . LAPAROSCOPIC ASSISTED VAGINAL HYSTERECTOMY N/A 04/23/2013   Procedure: LAPAROSCOPIC ASSISTED VAGINAL HYSTERECTOMY;  Surgeon: Cyril Mourning, MD;  Location: [facility];  Service: Gynecology;  Laterality: N/A;  . LIPOSUCTION    . SALPINGOOPHORECTOMY Bilateral 04/23/2013   Procedure: SALPINGO OOPHORECTOMY;  Surgeon: Cyril Mourning, MD;  Location: [facility];  Service: Gynecology;  Laterality: Bilateral;    OB History    No data available       Home Medications    Prior to Admission medications   Medication Sig Start Date End Date Taking? Authorizing Provider  ALPRAZolam (  XANAX) 0.5 MG tablet Take 0.5 mg by mouth daily as needed for anxiety.     [provider]  BYSTOLIC 10 MG tablet Take 10 mg by mouth daily. 12/31/12   [provider]  cetirizine (ZYRTEC) 10 MG tablet Take 10 mg by mouth daily.    [provider]  cloNIDine (CATAPRES) 0.2 MG tablet Take 0.2 mg by mouth daily. 12/25/12   [provider]  famotidine (PEPCID AC) 10 MG tablet Take 10 mg by mouth daily as needed for heartburn or indigestion.    [provider]  fenofibrate 160 MG tablet Take 160 mg by mouth at bedtime.     [provider]  fluticasone (FLONASE) 50 MCG/ACT nasal spray Place 2 sprays into the nose daily.    [provider]  furosemide (LASIX) 20 MG tablet Take 1 tablet (20 mg total) by mouth daily. 12/09/16   Tanna Furry, MD  olmesartan (BENICAR) 20 MG tablet Take 20 mg by mouth daily.    [provider]  pantoprazole (PROTONIX) 40 MG tablet Take 40 mg by mouth daily.    [provider]  potassium chloride (K-DUR) 10 MEQ tablet Take 1 tablet (10 mEq total) by mouth 2 (two) times daily. 12/09/16   Tanna Furry, MD  traZODone (DESYREL) 100 MG tablet Take 1 tablet (100 mg total) by mouth at bedtime. 12/09/16   Tanna Furry, MD  UNABLE TO FIND Med Name: Thyroid Med    [provider]  VENTOLIN HFA 108 (90 BASE) MCG/ACT inhaler Inhale 2 puffs into the lungs 4 (four) times daily as needed. 03/07/13   [provider]  VIIBRYD 40 MG TABS TK 1 T PO QD 09/18/15   [provider]    Family History Family History  Problem Relation Age of Onset  . Heart attack Father   . Coronary artery disease Father   . Hypertension Father   . Diabetes Father   . Cancer - Prostate Father   . Cancer - Other Maternal Grandmother        breast and bone  . Cancer - Other Maternal Grandfather        liver    Social History Social History   Tobacco Use  . Smoking status: Current Some Day Smoker    Years: 14.00    Types: Cigarettes    Last attempt to quit: 04/22/1980    Years since quitting: 36.6  . Smokeless tobacco: Never Used  . Tobacco comment: occasional cigs/ day  Substance Use Topics  . Alcohol use: Yes    Alcohol/week: 1.2 oz    Types: 2 Glasses of wine per week    Comment: white  . Drug use: No     Allergies   Metronidazole; Penicillins; Sulfonamide derivatives; Amoxicillin; Atorvastatin; and Rosuvastatin   Review of Systems Review of Systems  Constitutional: Positive for fatigue and unexpected weight change. Negative for appetite change, chills and  fever.  HENT: Positive for facial swelling. Negative for mouth sores, sore throat and trouble swallowing.   Eyes: Negative for visual disturbance.  Respiratory: Positive for shortness of breath. Negative for cough, chest tightness and wheezing.   Cardiovascular: Positive for chest pain and leg swelling.  Gastrointestinal: Positive for abdominal distention. Negative for abdominal pain, diarrhea, nausea and vomiting.  Endocrine: Positive for polyphagia. Negative for polydipsia and polyuria.  Genitourinary: Negative for dysuria, frequency and hematuria.  Musculoskeletal: Positive for back pain. Negative for gait problem.  Skin: Negative for color change,  pallor and rash.  Neurological: Positive for headaches. Negative for dizziness, syncope and light-headedness.  Hematological: Does not bruise/bleed easily.  Psychiatric/Behavioral: Negative for behavioral problems and confusion. The patient is nervous/anxious.      Physical Exam Updated Vital Signs BP (!) 142/93   Pulse 66   Temp 99.2 F (37.3 C) (Oral)   Resp 14   Ht 5\' 8"  (1.727 m)   Wt 95.3 kg (210 lb)   SpO2 100%   BMI 31.93 kg/m   Physical Exam  Constitutional: She is oriented to person, place, and time. No distress.  Markedly anxious  HENT:  Head: Normocephalic.  Eyes: Conjunctivae are normal. Pupils are equal, round, and reactive to light. No scleral icterus.  Neck: Normal range of motion. Neck supple. No thyromegaly present.  No JVD.  Cardiovascular: Normal rate and regular rhythm. Exam reveals no gallop and no friction rub.  No murmur heard. No S4. Clear lungs without crackles. Clinically no signs of congestive heart failure other than dependent edema.  Pulmonary/Chest: Effort normal and breath sounds normal. No respiratory distress. She has no wheezes. She has no rales.  Clear bilateral breath sounds.  Abdominal: Soft. Bowel sounds are normal. She exhibits no distension. There is no tenderness. There is no rebound.    Soft. Nondistended. Nontender.  Musculoskeletal: Normal range of motion.       Right lower leg: She exhibits edema.       Left lower leg: She exhibits edema.  Trace to 1+ symmetric lower chin edema.  Neurological: She is alert and oriented to person, place, and time.  Skin: Skin is warm and dry. No rash noted.  Psychiatric: She has a normal mood and affect. Her behavior is normal.     ED Treatments / Results  Labs (Mccullough labs ordered are listed, but only abnormal results are displayed) Labs Reviewed  BASIC METABOLIC PANEL - Abnormal; Notable for the following components:      Result Value   Glucose, Bld 122 (*)    Mccullough other components within normal limits  CBC - Abnormal; Notable for the following components:   MCV 100.5 (*)    Mccullough other components within normal limits  BRAIN NATRIURETIC PEPTIDE - Abnormal; Notable for the following components:   B Natriuretic Peptide 107.5 (*)    Mccullough other components within normal limits  TSH  D-DIMER, QUANTITATIVE (NOT AT [facility])  URINALYSIS, ROUTINE W REFLEX MICROSCOPIC  I-STAT TROPONIN, ED  I-STAT TROPONIN, ED    EKG  EKG Interpretation None       Radiology Dg Chest 2 View  Result Date: 12/09/2016 CLINICAL DATA:  66 year old female with shortness of breath for 4-5 days. EXAM: CHEST  2 VIEW COMPARISON:  04/22/2013 FINDINGS: The heart size and mediastinal contours are within normal limits. Both lungs are clear. The visualized skeletal structures are unremarkable. IMPRESSION: No active cardiopulmonary disease. Electronically Signed   By: Kristopher Oppenheim M.D.   On: 12/09/2016 12:23    Procedures Procedures (including critical care time)  Medications Ordered in ED Medications  LORazepam (ATIVAN) tablet 2 mg (2 mg Oral Given 12/09/16 1439)  acetaminophen (TYLENOL) tablet 1,000 mg (1,000 mg Oral Given 12/09/16 1503)  sodium chloride 0.9 % bolus 1,000 mL (1,000 mLs Intravenous New Bag/Given 12/09/16 1508)     Initial Impression /  Assessment and Plan / ED Course  I have reviewed the triage vital signs and the nursing notes.  Pertinent labs & imaging results that were available during my care of  the patient were reviewed by me and considered in my medical decision making (see chart for details).    Patient has normal EKG and normal first troponin. Chest x-ray shows no cardiomegaly or signs of fluid overload. She has normal BNP. TSH and d-dimer are pending.  Discussed with her think her biggest symptom is her anxiety. Do not think this is secondary to another acute process. She is sleeping poorly. States she is under great deal of stress "with family things". Apparently her "kids father is dying". She states she became overly concerned because "I did want them to lose both parents". Delta troponin negative. If BNP is negative this will complete a workup today. Her TSH not be available tomorrow. Plan is assistance with her sleep. She will increase her when necessary Xanax. See Dr. Robina Ade, her psychiatrist. Burnis Medin restart her Lasix and potassium when necessary for her dependent edema.  Final Clinical Impressions(s) / ED Diagnoses   Final diagnoses:  Dependent edema  Anxiety    ED Discharge Orders        Ordered    potassium chloride (K-DUR) 10 MEQ tablet  2 times daily     12/09/16 1514    furosemide (LASIX) 20 MG tablet  Daily     12/09/16 1514    traZODone (DESYREL) 100 MG tablet  Daily at bedtime     12/09/16 1514       Tanna Furry, MD 12/09/16 [id number]

## 2017-10-25 ENCOUNTER — Ambulatory Visit: Payer: BLUE CROSS/BLUE SHIELD | Admitting: Neurology

## 2017-12-11 ENCOUNTER — Ambulatory Visit: Payer: BLUE CROSS/BLUE SHIELD | Admitting: Neurology

## 2018-06-10 ENCOUNTER — Emergency Department (HOSPITAL_COMMUNITY): Payer: Medicare HMO

## 2018-06-10 ENCOUNTER — Encounter (HOSPITAL_COMMUNITY): Payer: Self-pay | Admitting: Emergency Medicine

## 2018-06-10 ENCOUNTER — Emergency Department (HOSPITAL_COMMUNITY)
Admission: EM | Admit: 2018-06-10 | Discharge: 2018-06-10 | Disposition: A | Discharge status: Home / Self Care | Payer: Medicare HMO | Attending: Emergency Medicine | Admitting: Emergency Medicine

## 2018-06-10 ENCOUNTER — Other Ambulatory Visit: Payer: Self-pay

## 2018-06-10 DIAGNOSIS — Z880 Allergy status to penicillin: Secondary | ICD-10-CM | POA: Diagnosis not present

## 2018-06-10 DIAGNOSIS — F1010 Alcohol abuse, uncomplicated: Secondary | ICD-10-CM | POA: Diagnosis not present

## 2018-06-10 DIAGNOSIS — I951 Orthostatic hypotension: Secondary | ICD-10-CM

## 2018-06-10 DIAGNOSIS — R55 Syncope and collapse: Secondary | ICD-10-CM | POA: Insufficient documentation

## 2018-06-10 DIAGNOSIS — Z79899 Other long term (current) drug therapy: Secondary | ICD-10-CM | POA: Insufficient documentation

## 2018-06-10 DIAGNOSIS — I10 Essential (primary) hypertension: Secondary | ICD-10-CM | POA: Diagnosis not present

## 2018-06-10 DIAGNOSIS — J45909 Unspecified asthma, uncomplicated: Secondary | ICD-10-CM | POA: Insufficient documentation

## 2018-06-10 DIAGNOSIS — F17210 Nicotine dependence, cigarettes, uncomplicated: Secondary | ICD-10-CM | POA: Diagnosis not present

## 2018-06-10 LAB — CBC WITH DIFFERENTIAL/PLATELET
Abs Immature Granulocytes: 0.06 10*3/uL (ref 0.00–0.07)
Basophils Absolute: 0.1 10*3/uL (ref 0.0–0.1)
Basophils Relative: 1 %
Eosinophils Absolute: 0.4 10*3/uL (ref 0.0–0.5)
Eosinophils Relative: 5 %
HCT: 40.7 % (ref 36.0–46.0)
Hemoglobin: 13.5 g/dL (ref 12.0–15.0)
Immature Granulocytes: 1 %
Lymphocytes Relative: 28 %
Lymphs Abs: 2.1 10*3/uL (ref 0.7–4.0)
MCH: 32.5 pg (ref 26.0–34.0)
MCHC: 33.2 g/dL (ref 30.0–36.0)
MCV: 97.8 fL (ref 80.0–100.0)
Monocytes Absolute: 0.5 10*3/uL (ref 0.1–1.0)
Monocytes Relative: 7 %
Neutro Abs: 4.3 10*3/uL (ref 1.7–7.7)
Neutrophils Relative %: 58 %
Platelets: 242 10*3/uL (ref 150–400)
RBC: 4.16 MIL/uL (ref 3.87–5.11)
RDW: 12 % (ref 11.5–15.5)
WBC: 7.4 10*3/uL (ref 4.0–10.5)
nRBC: 0 % (ref 0.0–0.2)

## 2018-06-10 LAB — TROPONIN I: Troponin I: <0.03 ng/mL (ref <0.03)

## 2018-06-10 LAB — URINALYSIS, ROUTINE W REFLEX MICROSCOPIC
Bacteria, UA: NONE SEEN
Bilirubin Urine: NEGATIVE
Glucose, UA: NEGATIVE mg/dL
Ketones, ur: NEGATIVE mg/dL
Leukocytes,Ua: NEGATIVE
Nitrite: NEGATIVE
Protein, ur: NEGATIVE mg/dL
Specific Gravity, Urine: 1.008 (ref 1.005–1.030)
pH: 5 (ref 5.0–8.0)

## 2018-06-10 LAB — RAPID URINE DRUG SCREEN, HOSP PERFORMED
Amphetamines: NOT DETECTED
Barbiturates: NOT DETECTED
Benzodiazepines: NOT DETECTED
Cocaine: NOT DETECTED
Opiates: NOT DETECTED
Tetrahydrocannabinol: NOT DETECTED

## 2018-06-10 LAB — BASIC METABOLIC PANEL
Anion gap: 12 (ref 5–15)
BUN: 14 mg/dL (ref 8–23)
CO2: 23 mmol/L (ref 22–32)
Calcium: 9 mg/dL (ref 8.9–10.3)
Chloride: 105 mmol/L (ref 98–111)
Creatinine, Ser: 1 mg/dL (ref 0.44–1.00)
GFR calc Af Amer: >60 mL/min (ref >60)
GFR calc non Af Amer: 59 mL/min — ABNORMAL LOW (ref >60)
Glucose, Bld: 110 mg/dL — ABNORMAL HIGH (ref 70–99)
Potassium: 3.4 mmol/L — ABNORMAL LOW (ref 3.5–5.1)
Sodium: 140 mmol/L (ref 135–145)

## 2018-06-10 LAB — ETHANOL: Alcohol, Ethyl (B): 175 mg/dL — ABNORMAL HIGH (ref <10)

## 2018-06-10 MED ORDER — SODIUM CHLORIDE 0.9 % IV BOLUS
500.0000 mL | Freq: Once | INTRAVENOUS | Status: AC
  Administered 2018-06-10: 02:00:00 500 mL via INTRAVENOUS

## 2018-06-10 NOTE — ED Notes (Signed)
Per pt. Request updated Brain, pt.'s son regards to pts. Arrival and work up.

## 2018-06-10 NOTE — ED Notes (Signed)
Discharge instructions and follow up care discussed with pt and son Thurmond Butts via phone call. All questions answered. Pt. verbalized understanding.Per Thurmond Butts, Family friend to come pick up pt.

## 2018-06-10 NOTE — ED Notes (Signed)
Patient transported to CT 

## 2018-06-10 NOTE — ED Triage Notes (Signed)
Pt. BIB EMS from home after 2 syncopal episodes.  First episode after getting out of bed with fall on the floor. Pt. Denies hitting head. Pt denies pain. Second episode with EMS when changing positions.  Pt. Endorses vodka today (everyday), states she drinks every night.  Pt. States she recently stopped taking Topamax.    VS per EMS BP  124/82 HR 72 SpO2 94 Room Air CBG 97

## 2018-06-10 NOTE — ED Provider Notes (Signed)
[facility] EMERGENCY DEPARTMENT Provider Note   CSN: [id number] Arrival date & time: 06/10/18  0030    History   Chief Complaint Chief Complaint  Patient presents with  . Loss of Consciousness    HPI Cynthia Mccullough is a 68 y.o. female.     The history is provided by the patient.  Fall  This is a new problem. The current episode started less than 1 hour ago. The problem occurs rarely. The problem has been resolved. Pertinent negatives include no chest pain, no abdominal pain, no headaches and no shortness of breath. Nothing aggravates the symptoms. Nothing relieves the symptoms. She has tried nothing for the symptoms. The treatment provided no relief.  Patient with h/o alcohol abusre who was drinking and walked outside and fell.  She states that she did not actually pass out. She believes this is from stopping topamax for her tremors.  No f/c/r.  No CP, no back pain, no SOb, no cough.  No f/c/r.  No anosmia.  Drank vodka and Siroc.    Past Medical History:  Diagnosis Date  . Allergic rhinitis   . Anxiety   . Asthma   . Depression   . Essential tremor   . HLD (hyperlipidemia)   . HTN (hypertension)   . Kidney stones   . Panic attacks     Patient Active Problem List   Diagnosis Date Noted  . S/P laparoscopic assisted vaginal hysterectomy (LAVH) 04/23/2013  . ALLERGIC CONJUNCTIVITIS 03/26/2007  . HYPERTENSION 03/26/2007  . Seasonal and perennial allergic rhinitis 03/26/2007  . Asthma, intermittent 03/26/2007  . URTICARIA 03/26/2007    Past Surgical History:  Procedure Laterality Date  . CERVICAL ABLATION    . DILATION AND CURETTAGE OF UTERUS    . EXCISION MORTON'S NEUROMA Right 2008  . FOOT SURGERY    . LAPAROSCOPIC ASSISTED VAGINAL HYSTERECTOMY N/A 04/23/2013   Procedure: LAPAROSCOPIC ASSISTED VAGINAL HYSTERECTOMY;  Surgeon: Cyril Mourning, MD;  Location: [facility];  Service: Gynecology;  Laterality: N/A;  . LIPOSUCTION    .  SALPINGOOPHORECTOMY Bilateral 04/23/2013   Procedure: SALPINGO OOPHORECTOMY;  Surgeon: Cyril Mourning, MD;  Location: [facility];  Service: Gynecology;  Laterality: Bilateral;     OB History   No obstetric history on file.      Home Medications    Prior to Admission medications   Medication Sig Start Date End Date Taking? Authorizing Provider  albuterol (PROVENTIL HFA;VENTOLIN HFA) 108 (90 Base) MCG/ACT inhaler Inhale 2 puffs into the lungs every 6 (six) hours as needed for wheezing or shortness of breath.  07/16/15   [provider]  albuterol (PROVENTIL HFA;VENTOLIN HFA) 108 (90 Base) MCG/ACT inhaler Inhale 2 puffs into the lungs every 4 (four) hours as needed for wheezing or shortness of breath. 12/09/16   Forde Dandy, MD  ALPRAZolam Duanne Moron) 0.5 MG tablet Take 0.5-0.75 mg by mouth at bedtime.     [provider]  cetirizine (ZYRTEC) 10 MG tablet Take 10 mg by mouth at bedtime.     [provider]  cloNIDine (CATAPRES) 0.2 MG tablet Take 0.2 mg by mouth at bedtime.  12/25/12   [provider]  fenofibrate 160 MG tablet Take 160 mg by mouth at bedtime.     [provider]  fluticasone (FLONASE) 50 MCG/ACT nasal spray Place 2 sprays into the nose daily.    [provider]  furosemide (LASIX) 20 MG tablet Take 1 tablet (20 mg total) by  mouth daily. 12/09/16   Tanna Furry, MD  furosemide (LASIX) 20 MG tablet Take 20 mg by mouth daily as needed for fluid or edema (leg and ankle swelling).  03/10/16 03/10/17  [provider]  gabapentin (NEURONTIN) 100 MG capsule Take 100 mg by mouth 2 (two) times daily. 11/30/16   [provider]  levothyroxine (SYNTHROID, LEVOTHROID) 50 MCG tablet Take 50 mcg by mouth daily before breakfast. 11/11/16   [provider]  nebivolol (BYSTOLIC) 10 MG tablet Take 10 mg by mouth at bedtime.    [provider]  olmesartan (BENICAR) 20 MG tablet Take 10 mg by mouth daily.     [provider]  pantoprazole (PROTONIX) 40 MG tablet Take 40 mg by mouth daily.    [provider]  Polyvinyl Alcohol-Povidone (REFRESH OP) Place 1 drop into both eyes daily.    [provider]  potassium chloride (K-DUR) 10 MEQ tablet Take 1 tablet (10 mEq total) by mouth 2 (two) times daily. 12/09/16   Tanna Furry, MD  potassium chloride (MICRO-K) 10 MEQ CR capsule Take 10 mEq by mouth daily as needed (with each lasix dose).    [provider]  traZODone (DESYREL) 100 MG tablet Take 1 tablet (100 mg total) by mouth at bedtime. 12/09/16   Tanna Furry, MD  Vilazodone HCl (VIIBRYD) 40 MG TABS Take 40 mg by mouth daily.    [provider]  promethazine (PHENERGAN) 25 MG tablet Take 1 tablet (25 mg total) by mouth every 6 (six) hours as needed for nausea or vomiting. 09/11/13 01/18/14  Molpus, Jenny Reichmann, MD    Family History Family History  Problem Relation Age of Onset  . Heart attack Father   . Coronary artery disease Father   . Hypertension Father   . Diabetes Father   . Cancer - Prostate Father   . Cancer - Other Maternal Grandmother        breast and bone  . Cancer - Other Maternal Grandfather        liver    Social History Social History   Tobacco Use  . Smoking status: Current Some Day Smoker    Years: 14.00    Types: Cigarettes    Last attempt to quit: 04/22/1980    Years since quitting: 38.1  . Smokeless tobacco: Never Used  . Tobacco comment: occasional cigs/ day  Substance Use Topics  . Alcohol use: Yes    Alcohol/week: 2.0 standard drinks    Types: 2 Glasses of wine per week    Comment: white  . Drug use: No     Allergies   Amoxicillin; Metronidazole; Penicillins; Sulfonamide derivatives; Atorvastatin; Cheese; and Rosuvastatin   Review of Systems Review of Systems  Constitutional: Negative for diaphoresis, fatigue and fever.  Eyes: Negative for visual disturbance.  Respiratory: Negative for cough and shortness of breath.    Cardiovascular: Negative for chest pain, palpitations and leg swelling.  Gastrointestinal: Negative for abdominal pain.  Musculoskeletal: Negative for back pain and neck pain.  Neurological: Negative for syncope, facial asymmetry, speech difficulty, weakness and headaches.  All other systems reviewed and are negative.    Physical Exam Updated Vital Signs BP 106/73   Pulse 68   Temp 97.8 F (36.6 C) (Oral)   Resp 11   Ht 5\' 8"  (1.727 m)   Wt 83 kg   SpO2 99%   BMI 27.83 kg/m   Physical Exam Vitals signs and nursing note reviewed.  Constitutional:  General: She is not in acute distress.    Appearance: She is obese.  HENT:     Head: Normocephalic and atraumatic.     Nose: Nose normal.     Mouth/Throat:     Mouth: Mucous membranes are moist.     Pharynx: Oropharynx is clear.  Eyes:     Extraocular Movements: Extraocular movements intact.     Conjunctiva/sclera: Conjunctivae normal.     Pupils: Pupils are equal, round, and reactive to light.  Neck:     Musculoskeletal: Normal range of motion and neck supple.  Cardiovascular:     Rate and Rhythm: Normal rate and regular rhythm.     Pulses: Normal pulses.     Heart sounds: Normal heart sounds.  Pulmonary:     Effort: Pulmonary effort is normal.     Breath sounds: Normal breath sounds.  Abdominal:     General: Abdomen is flat. Bowel sounds are normal.     Tenderness: There is no abdominal tenderness. There is no guarding.  Musculoskeletal: Normal range of motion.  Skin:    General: Skin is warm and dry.  Neurological:     General: No focal deficit present.     Mental Status: She is alert and oriented to person, place, and time.     Cranial Nerves: No cranial nerve deficit.     Coordination: Coordination normal.     Deep Tendon Reflexes: Reflexes normal.  Psychiatric:        Mood and Affect: Mood normal.        Behavior: Behavior normal.      ED Treatments / Results  Labs (all labs ordered are listed, but  only abnormal results are displayed) Results for orders placed or performed during the hospital encounter of 06/10/18  CBC with Differential/Platelet  Result Value Ref Range   WBC 7.4 4.0 - 10.5 K/uL   RBC 4.16 3.87 - 5.11 MIL/uL   Hemoglobin 13.5 12.0 - 15.0 g/dL   HCT 40.7 36.0 - 46.0 %   MCV 97.8 80.0 - 100.0 fL   MCH 32.5 26.0 - 34.0 pg   MCHC 33.2 30.0 - 36.0 g/dL   RDW 12.0 11.5 - 15.5 %   Platelets 242 150 - 400 K/uL   nRBC 0.0 0.0 - 0.2 %   Neutrophils Relative % 58 %   Neutro Abs 4.3 1.7 - 7.7 K/uL   Lymphocytes Relative 28 %   Lymphs Abs 2.1 0.7 - 4.0 K/uL   Monocytes Relative 7 %   Monocytes Absolute 0.5 0.1 - 1.0 K/uL   Eosinophils Relative 5 %   Eosinophils Absolute 0.4 0.0 - 0.5 K/uL   Basophils Relative 1 %   Basophils Absolute 0.1 0.0 - 0.1 K/uL   Immature Granulocytes 1 %   Abs Immature Granulocytes 0.06 0.00 - 0.07 K/uL  Basic metabolic panel  Result Value Ref Range   Sodium 140 135 - 145 mmol/L   Potassium 3.4 (L) 3.5 - 5.1 mmol/L   Chloride 105 98 - 111 mmol/L   CO2 23 22 - 32 mmol/L   Glucose, Bld 110 (H) 70 - 99 mg/dL   BUN 14 8 - 23 mg/dL   Creatinine, Ser 1.00 0.44 - 1.00 mg/dL   Calcium 9.0 8.9 - 10.3 mg/dL   GFR calc non Af Amer 59 (L) >60 mL/min   GFR calc Af Amer >60 >60 mL/min   Anion gap 12 5 - 15  Troponin I - ONCE - STAT  Result  Value Ref Range   Troponin I <0.03 <0.03 ng/mL  Urinalysis, Routine w reflex microscopic  Result Value Ref Range   Color, Urine STRAW (A) YELLOW   APPearance HAZY (A) CLEAR   Specific Gravity, Urine 1.008 1.005 - 1.030   pH 5.0 5.0 - 8.0   Glucose, UA NEGATIVE NEGATIVE mg/dL   Hgb urine dipstick SMALL (A) NEGATIVE   Bilirubin Urine NEGATIVE NEGATIVE   Ketones, ur NEGATIVE NEGATIVE mg/dL   Protein, ur NEGATIVE NEGATIVE mg/dL   Nitrite NEGATIVE NEGATIVE   Leukocytes,Ua NEGATIVE NEGATIVE   RBC / HPF 0-5 0 - 5 RBC/hpf   WBC, UA 0-5 0 - 5 WBC/hpf   Bacteria, UA NONE SEEN NONE SEEN   Squamous Epithelial / LPF  0-5 0 - 5   Mucus PRESENT    Triple Phosphate Crystal PRESENT   Ethanol  Result Value Ref Range   Alcohol, Ethyl (B) 175 (H) <10 mg/dL  Rapid urine drug screen (hospital performed)  Result Value Ref Range   Opiates NONE DETECTED NONE DETECTED   Cocaine NONE DETECTED NONE DETECTED   Benzodiazepines NONE DETECTED NONE DETECTED   Amphetamines NONE DETECTED NONE DETECTED   Tetrahydrocannabinol NONE DETECTED NONE DETECTED   Barbiturates NONE DETECTED NONE DETECTED   Ct Head Wo Contrast  Result Date: 06/10/2018 CLINICAL DATA:  68 year old female with history of syncopal episodes. Fall onto the floor. EXAM: CT HEAD WITHOUT CONTRAST TECHNIQUE: Contiguous axial images were obtained from the base of the skull through the vertex without intravenous contrast. COMPARISON:  No priors. FINDINGS: Brain: No evidence of acute infarction, hemorrhage, hydrocephalus, extra-axial collection or mass lesion/mass effect. Vascular: No hyperdense vessel or unexpected calcification. Skull: Normal. Negative for fracture or focal lesion. Sinuses/Orbits: No acute finding. Other: None. IMPRESSION: 1. No acute intracranial abnormalities. The appearance of the brain is normal. Electronically Signed   By: Vinnie Langton M.D.   On: 06/10/2018 01:54   Dg Chest Portable 1 View  Result Date: 06/10/2018 CLINICAL DATA:  68 year old female with history of syncope. EXAM: PORTABLE CHEST 1 VIEW COMPARISON:  Chest x-ray 12/09/2016. FINDINGS: Lung volumes are normal. No consolidative airspace disease. No pleural effusions. No pneumothorax. No pulmonary nodule or mass noted. Pulmonary vasculature and the cardiomediastinal silhouette are within normal limits. IMPRESSION: No radiographic evidence of acute cardiopulmonary disease. Electronically Signed   By: Vinnie Langton M.D.   On: 06/10/2018 00:50    EKG EKG Interpretation  Date/Time:  Monday June 10 2018 00:32:20 EDT Ventricular Rate:  72 PR Interval:    QRS Duration: 110 QT  Interval:  443 QTC Calculation: 485 R Axis:   41 Text Interpretation:  Sinus rhythm Confirmed by Dory Horn) on 06/10/2018 12:36:21 AM   Radiology Dg Chest Portable 1 View  Result Date: 06/10/2018 CLINICAL DATA:  68 year old female with history of syncope. EXAM: PORTABLE CHEST 1 VIEW COMPARISON:  Chest x-ray 12/09/2016. FINDINGS: Lung volumes are normal. No consolidative airspace disease. No pleural effusions. No pneumothorax. No pulmonary nodule or mass noted. Pulmonary vasculature and the cardiomediastinal silhouette are within normal limits. IMPRESSION: No radiographic evidence of acute cardiopulmonary disease. Electronically Signed   By: Vinnie Langton M.D.   On: 06/10/2018 00:50    Procedures Procedures (including critical care time)  Medications Ordered in ED Medications  sodium chloride 0.9 % bolus 500 mL (has no administration in time range)      Patient states she did not pass out.  I suspect alcohol is to blame for her fall.  She is orthostatic likely from not drinking water and replacing PO liquids with ETOH.  She is safe for discharge with close follow up Final Clinical Impressions(s) / ED Diagnoses   Return for intractable cough, coughing up blood,fevers >100.4 unrelieved by medication, shortness of breath, intractable vomiting, chest pain, shortness of breath, weakness,numbness, changes in speech, facial asymmetry,abdominal pain, passing out,Inability to tolerate liquids or food, cough, altered mental status or any concerns. No signs of systemic illness or infection. The patient is nontoxic-appearing on exam and vital signs are within normal limits.   I have reviewed the triage vital signs and the nursing notes. Pertinent labs &imaging results that were available during my care of the patient were reviewed by me and considered in my medical decision making (see chart for details).  After history, exam, and medical workup I feel the patient has been  appropriately medically screened and is safe for discharge home. Pertinent diagnoses were discussed with the patient. Patient was given return precautions   Decklyn Hyder, MD 06/10/18 0454

## 2018-06-10 NOTE — Discharge Instructions (Signed)
Drink more water 6-10 sports bottles a day.

## 2019-01-28 ENCOUNTER — Ambulatory Visit: Payer: Medicare HMO

## 2019-02-06 ENCOUNTER — Ambulatory Visit: Payer: Medicare HMO | Attending: Internal Medicine

## 2019-02-06 DIAGNOSIS — Z23 Encounter for immunization: Secondary | ICD-10-CM

## 2019-02-06 NOTE — Progress Notes (Signed)
   Covid-19 Vaccination Clinic  Name:  Cynthia Mccullough    MRN: [id number] DOB: 1950/04/26  02/06/2019  Cynthia Mccullough was observed post Covid-19 immunization for 30 minutes based on pre-vaccination screening without incidence. She was provided with Vaccine Information Sheet and instruction to access the V-Safe system.   Cynthia Mccullough was instructed to call 911 with any severe reactions post vaccine: Marland Kitchen Difficulty breathing  . Swelling of your face and throat  . A fast heartbeat  . A bad rash all over your body  . Dizziness and weakness    Immunizations Administered    Name Date Dose VIS Date Route   Pfizer COVID-19 Vaccine 02/06/2019  1:08 PM 0.3 mL 12/13/2018 Intramuscular   Manufacturer: [place]   Lot: [id number]   [place]: [id number]

## 2019-03-04 ENCOUNTER — Ambulatory Visit: Payer: Medicare HMO | Attending: Internal Medicine

## 2019-03-04 DIAGNOSIS — Z23 Encounter for immunization: Secondary | ICD-10-CM

## 2019-03-04 NOTE — Progress Notes (Addendum)
   Covid-19 Vaccination Clinic  Name:  Cynthia Mccullough    MRN: [id number] DOB: 01-26-1950  03/04/2019  Cynthia Mccullough was observed post Covid-19 immunization for 41min without incident. She was provided with Vaccine Information Sheet and instruction to access the V-Safe system.   Cynthia Mccullough was instructed to call 911 with any severe reactions post vaccine: Marland Kitchen Difficulty breathing  . Swelling of face and throat  . A fast heartbeat  . A bad rash all over body  . Dizziness and weakness   Immunizations Administered    Name Date Dose VIS Date Route   Pfizer COVID-19 Vaccine 03/04/2019  1:30 PM 0.3 mL 12/13/2018 Intramuscular   Manufacturer: [place]   Lot: [id number]   [place]: [id number]

## 2020-03-12 ENCOUNTER — Encounter (HOSPITAL_COMMUNITY): Payer: Self-pay

## 2020-03-12 ENCOUNTER — Observation Stay (HOSPITAL_COMMUNITY)
Admission: EM | Admit: 2020-03-12 | Discharge: 2020-03-14 | Disposition: A | Discharge status: Home / Self Care | Payer: Medicare HMO | Attending: Family Medicine | Admitting: Family Medicine

## 2020-03-12 ENCOUNTER — Observation Stay (HOSPITAL_COMMUNITY): Payer: Medicare HMO

## 2020-03-12 ENCOUNTER — Emergency Department (HOSPITAL_COMMUNITY): Payer: Medicare HMO

## 2020-03-12 DIAGNOSIS — I169 Hypertensive crisis, unspecified: Principal | ICD-10-CM | POA: Diagnosis present

## 2020-03-12 DIAGNOSIS — I10 Essential (primary) hypertension: Secondary | ICD-10-CM | POA: Diagnosis not present

## 2020-03-12 DIAGNOSIS — F17210 Nicotine dependence, cigarettes, uncomplicated: Secondary | ICD-10-CM | POA: Insufficient documentation

## 2020-03-12 DIAGNOSIS — F32A Depression, unspecified: Secondary | ICD-10-CM | POA: Diagnosis not present

## 2020-03-12 DIAGNOSIS — Z20822 Contact with and (suspected) exposure to covid-19: Secondary | ICD-10-CM | POA: Insufficient documentation

## 2020-03-12 DIAGNOSIS — K529 Noninfective gastroenteritis and colitis, unspecified: Secondary | ICD-10-CM | POA: Insufficient documentation

## 2020-03-12 DIAGNOSIS — E039 Hypothyroidism, unspecified: Secondary | ICD-10-CM | POA: Diagnosis present

## 2020-03-12 DIAGNOSIS — J4520 Mild intermittent asthma, uncomplicated: Secondary | ICD-10-CM | POA: Diagnosis present

## 2020-03-12 DIAGNOSIS — R0600 Dyspnea, unspecified: Secondary | ICD-10-CM

## 2020-03-12 DIAGNOSIS — I674 Hypertensive encephalopathy: Secondary | ICD-10-CM

## 2020-03-12 DIAGNOSIS — Z79899 Other long term (current) drug therapy: Secondary | ICD-10-CM | POA: Diagnosis not present

## 2020-03-12 DIAGNOSIS — M059 Rheumatoid arthritis with rheumatoid factor, unspecified: Secondary | ICD-10-CM

## 2020-03-12 DIAGNOSIS — R410 Disorientation, unspecified: Secondary | ICD-10-CM | POA: Diagnosis present

## 2020-03-12 DIAGNOSIS — J45909 Unspecified asthma, uncomplicated: Secondary | ICD-10-CM | POA: Insufficient documentation

## 2020-03-12 LAB — URINALYSIS, ROUTINE W REFLEX MICROSCOPIC
Bilirubin Urine: NEGATIVE
Glucose, UA: NEGATIVE mg/dL
Ketones, ur: NEGATIVE mg/dL
Leukocytes,Ua: NEGATIVE
Nitrite: NEGATIVE
Protein, ur: NEGATIVE mg/dL
Specific Gravity, Urine: 1.014 (ref 1.005–1.030)
pH: 5 (ref 5.0–8.0)

## 2020-03-12 LAB — CBC WITH DIFFERENTIAL/PLATELET
Abs Immature Granulocytes: 0.06 10*3/uL (ref 0.00–0.07)
Basophils Absolute: 0.1 10*3/uL (ref 0.0–0.1)
Basophils Relative: 1 %
Eosinophils Absolute: 0.2 10*3/uL (ref 0.0–0.5)
Eosinophils Relative: 3 %
HCT: 42.4 % (ref 36.0–46.0)
Hemoglobin: 14 g/dL (ref 12.0–15.0)
Immature Granulocytes: 1 %
Lymphocytes Relative: 24 %
Lymphs Abs: 2.1 10*3/uL (ref 0.7–4.0)
MCH: 34.1 pg — ABNORMAL HIGH (ref 26.0–34.0)
MCHC: 33 g/dL (ref 30.0–36.0)
MCV: 103.4 fL — ABNORMAL HIGH (ref 80.0–100.0)
Monocytes Absolute: 0.6 10*3/uL (ref 0.1–1.0)
Monocytes Relative: 7 %
Neutro Abs: 5.6 10*3/uL (ref 1.7–7.7)
Neutrophils Relative %: 64 %
Platelets: 185 10*3/uL (ref 150–400)
RBC: 4.1 MIL/uL (ref 3.87–5.11)
RDW: 12.6 % (ref 11.5–15.5)
WBC: 8.6 10*3/uL (ref 4.0–10.5)
nRBC: 0 % (ref 0.0–0.2)

## 2020-03-12 LAB — RESP PANEL BY RT-PCR (FLU A&B, COVID) ARPGX2
Influenza A by PCR: NEGATIVE
Influenza B by PCR: NEGATIVE
SARS Coronavirus 2 by RT PCR: NEGATIVE

## 2020-03-12 LAB — COMPREHENSIVE METABOLIC PANEL
ALT: 23 U/L (ref 0–44)
AST: 16 U/L (ref 15–41)
Albumin: 3.6 g/dL (ref 3.5–5.0)
Alkaline Phosphatase: 50 U/L (ref 38–126)
Anion gap: 9 (ref 5–15)
BUN: 9 mg/dL (ref 8–23)
CO2: 28 mmol/L (ref 22–32)
Calcium: 9.3 mg/dL (ref 8.9–10.3)
Chloride: 99 mmol/L (ref 98–111)
Creatinine, Ser: 0.73 mg/dL (ref 0.44–1.00)
GFR, Estimated: >60 mL/min (ref >60)
Glucose, Bld: 111 mg/dL — ABNORMAL HIGH (ref 70–99)
Potassium: 3.8 mmol/L (ref 3.5–5.1)
Sodium: 136 mmol/L (ref 135–145)
Total Bilirubin: 0.9 mg/dL (ref 0.3–1.2)
Total Protein: 6.2 g/dL — ABNORMAL LOW (ref 6.5–8.1)

## 2020-03-12 MED ORDER — COLESTIPOL HCL 1 G PO TABS
1.0000 g | ORAL_TABLET | Freq: Every day | ORAL | Status: DC
  Administered 2020-03-13 – 2020-03-14 (×2): 1 g via ORAL
  Filled 2020-03-12 (×2): qty 1

## 2020-03-12 MED ORDER — HYDROCODONE-ACETAMINOPHEN 5-325 MG PO TABS
1.0000 | ORAL_TABLET | Freq: Four times a day (QID) | ORAL | Status: DC | PRN
  Administered 2020-03-13 (×2): 1 via ORAL
  Filled 2020-03-12 (×2): qty 1

## 2020-03-12 MED ORDER — ACETAMINOPHEN 325 MG PO TABS
650.0000 mg | ORAL_TABLET | Freq: Four times a day (QID) | ORAL | Status: DC | PRN
  Administered 2020-03-13 – 2020-03-14 (×2): 650 mg via ORAL
  Filled 2020-03-12 (×2): qty 2

## 2020-03-12 MED ORDER — SODIUM CHLORIDE 0.9 % IV SOLN
250.0000 mL | INTRAVENOUS | Status: DC | PRN
Start: 2020-03-12 — End: 2020-03-14

## 2020-03-12 MED ORDER — KETOROLAC TROMETHAMINE 15 MG/ML IJ SOLN: 15.0000 mg | Freq: Four times a day (QID) | INTRAMUSCULAR | Status: DC | PRN

## 2020-03-12 MED ORDER — LEVOTHYROXINE SODIUM 50 MCG PO TABS
50.0000 ug | ORAL_TABLET | Freq: Every day | ORAL | Status: DC
  Administered 2020-03-13 – 2020-03-14 (×2): 50 ug via ORAL
  Filled 2020-03-12 (×2): qty 1

## 2020-03-12 MED ORDER — ZOLPIDEM TARTRATE 5 MG PO TABS: 5.0000 mg | ORAL_TABLET | Freq: Every evening | ORAL | Status: DC | PRN

## 2020-03-12 MED ORDER — TRAZODONE HCL 50 MG PO TABS
100.0000 mg | ORAL_TABLET | Freq: Every day | ORAL | Status: DC
  Administered 2020-03-13 (×2): 100 mg via ORAL
  Filled 2020-03-12 (×2): qty 2

## 2020-03-12 MED ORDER — ONDANSETRON HCL 4 MG PO TABS: 4.0000 mg | ORAL_TABLET | Freq: Four times a day (QID) | ORAL | Status: DC | PRN

## 2020-03-12 MED ORDER — IRBESARTAN 300 MG PO TABS
150.0000 mg | ORAL_TABLET | Freq: Every day | ORAL | Status: DC
  Administered 2020-03-13 – 2020-03-14 (×2): 150 mg via ORAL
  Filled 2020-03-12 (×3): qty 1

## 2020-03-12 MED ORDER — HYDRALAZINE HCL 25 MG PO TABS
25.0000 mg | ORAL_TABLET | Freq: Four times a day (QID) | ORAL | Status: DC | PRN
  Administered 2020-03-13: 25 mg via ORAL
  Filled 2020-03-12: qty 1

## 2020-03-12 MED ORDER — VILAZODONE HCL 20 MG PO TABS
40.0000 mg | ORAL_TABLET | Freq: Every day | ORAL | Status: DC
  Administered 2020-03-13 – 2020-03-14 (×2): 40 mg via ORAL
  Filled 2020-03-12 (×2): qty 2

## 2020-03-12 MED ORDER — NEBIVOLOL HCL 10 MG PO TABS
10.0000 mg | ORAL_TABLET | Freq: Every day | ORAL | Status: DC
  Administered 2020-03-13 (×2): 10 mg via ORAL
  Filled 2020-03-12 (×3): qty 1

## 2020-03-12 MED ORDER — ONDANSETRON HCL 4 MG/2ML IJ SOLN: 4.0000 mg | Freq: Four times a day (QID) | INTRAMUSCULAR | Status: DC | PRN

## 2020-03-12 MED ORDER — ALBUTEROL SULFATE HFA 108 (90 BASE) MCG/ACT IN AERS
2.0000 | INHALATION_SPRAY | RESPIRATORY_TRACT | Status: DC | PRN
  Filled 2020-03-12: qty 6.7

## 2020-03-12 MED ORDER — HYDRALAZINE HCL 20 MG/ML IJ SOLN
5.0000 mg | Freq: Once | INTRAMUSCULAR | Status: AC
  Administered 2020-03-12: 5 mg via INTRAVENOUS
  Filled 2020-03-12: qty 1

## 2020-03-12 MED ORDER — ENOXAPARIN SODIUM 40 MG/0.4ML ~~LOC~~ SOLN
40.0000 mg | SUBCUTANEOUS | Status: DC
  Administered 2020-03-13 – 2020-03-14 (×2): 40 mg via SUBCUTANEOUS
  Filled 2020-03-12 (×2): qty 0.4

## 2020-03-12 MED ORDER — BUPROPION HCL ER (SR) 150 MG PO TB12
150.0000 mg | ORAL_TABLET | Freq: Two times a day (BID) | ORAL | Status: DC
Start: 2020-03-13 — End: 2020-03-14
  Administered 2020-03-13 – 2020-03-14 (×3): 150 mg via ORAL
  Filled 2020-03-12 (×4): qty 1

## 2020-03-12 NOTE — ED Notes (Signed)
Pt transported to CT ?

## 2020-03-12 NOTE — H&P (Incomplete)
History and Physical    Cynthia Mccullough [facility] BJS:[id number] DOB: 02/15/1950 DOA: 03/12/2020  PCP: Chesley Noon, MD   Patient coming from: Home   Chief Complaint: Headache, confusion, high BP   HPI: Cynthia Mccullough is a 70 y.o. female with medical history significant for hypertension, rheumatoid arthritis, hypothyroidism, depression, anxiety, insomnia, chronic diarrhea, who now presents emergency department for evaluation of elevated blood pressures, headache, and confusion.  Patient reports that for the past 2 days she had been experiencing some mild and vague  ED Course: Upon arrival to the ED, patient is found to be ***  Review of Systems:  All other systems reviewed and apart from HPI, are negative.  Past Medical History:  Diagnosis Date  . Allergic rhinitis   . Anxiety   . Asthma   . Depression   . Essential tremor   . HLD (hyperlipidemia)   . HTN (hypertension)   . Kidney stones   . Panic attacks     Past Surgical History:  Procedure Laterality Date  . CERVICAL ABLATION    . DILATION AND CURETTAGE OF UTERUS    . EXCISION MORTON'S NEUROMA Right 2008  . FOOT SURGERY    . LAPAROSCOPIC ASSISTED VAGINAL HYSTERECTOMY N/A 04/23/2013   Procedure: LAPAROSCOPIC ASSISTED VAGINAL HYSTERECTOMY;  Surgeon: Cyril Mourning, MD;  Location: [facility];  Service: Gynecology;  Laterality: N/A;  . LIPOSUCTION    . SALPINGOOPHORECTOMY Bilateral 04/23/2013   Procedure: SALPINGO OOPHORECTOMY;  Surgeon: Cyril Mourning, MD;  Location: [facility];  Service: Gynecology;  Laterality: Bilateral;    Social History:   reports that she has been smoking cigarettes. She has smoked for the past 14.00 years. She has never used smokeless tobacco. She reports current alcohol use of about 2.0 standard drinks of alcohol per week. She reports that she does not use drugs.  Allergies  Allergen Reactions  . Amoxicillin Hives  . Metronidazole Hives  . Penicillins Anaphylaxis    Has patient had a PCN  reaction causing immediate rash, facial/tongue/throat swelling, SOB or lightheadedness with hypotension: Yes Has patient had a PCN reaction causing severe rash involving mucus membranes or skin necrosis: No Has patient had a PCN reaction that required hospitalization: Yes Has patient had a PCN reaction occurring within the last 10 years: No If all of the above answers are "NO", then may proceed with Cephalosporin use.  . Sulfonamide Derivatives Hives    REACTION: urticaria  . Atorvastatin Other (See Comments)    Unknown reaction  . Cheese Hives    Reaction to aged cheeses - blue, gorgonzola  . Rosuvastatin Itching    Family History  Problem Relation Age of Onset  . Heart attack Father   . Coronary artery disease Father   . Hypertension Father   . Diabetes Father   . Cancer - Prostate Father   . Cancer - Other Maternal Grandmother        breast and bone  . Cancer - Other Maternal Grandfather        liver     Prior to Admission medications   Medication Sig Start Date End Date Taking? Authorizing Provider  albuterol (PROVENTIL HFA;VENTOLIN HFA) 108 (90 Base) MCG/ACT inhaler Inhale 2 puffs into the lungs every 4 (four) hours as needed for wheezing or shortness of breath. 12/09/16   Forde Dandy, MD  ALPRAZolam Duanne Moron) 0.5 MG tablet Take 0.5-0.75 mg by mouth at bedtime.     [provider]  cetirizine (ZYRTEC) 10 MG  tablet Take 10 mg by mouth at bedtime.     [provider]  cloNIDine (CATAPRES) 0.2 MG tablet Take 0.2 mg by mouth at bedtime.  12/25/12   [provider]  colestipol (COLESTID) 1 g tablet Take 1 tablet by mouth daily. 04/18/18   [provider]  cyanocobalamin (,VITAMIN B-12,) 1000 MCG/ML injection Inject 1 mL into the muscle every 30 (thirty) days. 04/17/18   [provider]  fenofibrate 160 MG tablet Take 160 mg by mouth at bedtime.     [provider]  furosemide (LASIX) 20 MG tablet Take 1 tablet (20 mg total) by  mouth daily. Patient not taking: Reported on 06/10/2018 12/09/16   Tanna Furry, MD  levothyroxine (SYNTHROID, LEVOTHROID) 50 MCG tablet Take 50 mcg by mouth daily before breakfast. 11/11/16   [provider]  nebivolol (BYSTOLIC) 10 MG tablet Take 10 mg by mouth at bedtime.    [provider]  olmesartan (BENICAR) 20 MG tablet Take 10 mg by mouth daily.     [provider]  pantoprazole (PROTONIX) 40 MG tablet Take 40 mg by mouth daily.    [provider]  Polyvinyl Alcohol-Povidone (REFRESH OP) Place 1 drop into both eyes daily as needed (dry eyes).     [provider]  potassium chloride (K-DUR) 10 MEQ tablet Take 1 tablet (10 mEq total) by mouth 2 (two) times daily. Patient not taking: Reported on 06/10/2018 12/09/16   Tanna Furry, MD  traZODone (DESYREL) 100 MG tablet Take 1 tablet (100 mg total) by mouth at bedtime. 12/09/16   Tanna Furry, MD  Vilazodone HCl (VIIBRYD) 40 MG TABS Take 40 mg by mouth daily.     [provider]  Vitamin D, Ergocalciferol, (DRISDOL) 1.25 MG (50000 UT) CAPS capsule Take 1 capsule by mouth once a week. 04/25/18   [provider]    Physical Exam: Vitals:   03/12/20 2004 03/12/20 2005 03/12/20 2105 03/12/20 2145  BP: (!) 155/78  (!) 153/87 (!) 153/81  Pulse: 71 64 67 66  Resp:  15 13 16   Temp:      TempSrc:      SpO2: 98% 99% 98% 96%     Constitutional: NAD, calm  Eyes: PERTLA, lids and conjunctivae normal ENMT: Mucous membranes are moist. Posterior pharynx clear of any exudate or lesions.   Neck: normal, supple, no masses, no thyromegaly Respiratory: clear to auscultation bilaterally, no wheezing, no crackles. No accessory muscle use.  Cardiovascular: S1 & S2 heard, regular rate and rhythm. No extremity edema. No significant JVD. Abdomen: No distension, no tenderness, soft. Bowel sounds active.  Musculoskeletal: no clubbing / cyanosis. No joint deformity upper and lower extremities.   Skin: no  significant rashes, lesions, ulcers. Warm, dry, well-perfused. Neurologic: CN 2-12 grossly intact. Sensation intact, DTR normal. Strength 5/5 in all 4 limbs.  Psychiatric: Alert and oriented to person, place, and situation. Pleasant and cooperative.    Labs and Imaging on Admission: I have personally reviewed following labs and imaging studies  CBC: Recent Labs  Lab 03/12/20 1741  WBC 8.6  NEUTROABS 5.6  HGB 14.0  HCT 42.4  MCV 103.4*  PLT 654   Basic Metabolic Panel: Recent Labs  Lab 03/12/20 1741  NA 136  K 3.8  CL 99  CO2 28  GLUCOSE 111*  BUN 9  CREATININE 0.73  CALCIUM 9.3   GFR: CrCl cannot be calculated (Unknown ideal weight.). Liver Function Tests: Recent Labs  Lab 03/12/20 1741  AST 16  ALT 23  ALKPHOS 50  BILITOT 0.9  PROT 6.2*  ALBUMIN 3.6   No results for input(s): LIPASE, AMYLASE in the last 168 hours. No results for input(s): AMMONIA in the last 168 hours. Coagulation Profile: No results for input(s): INR, PROTIME in the last 168 hours. Cardiac Enzymes: No results for input(s): CKTOTAL, CKMB, CKMBINDEX, TROPONINI in the last 168 hours. BNP (last 3 results) No results for input(s): PROBNP in the last 8760 hours. HbA1C: No results for input(s): HGBA1C in the last 72 hours. CBG: No results for input(s): GLUCAP in the last 168 hours. Lipid Profile: No results for input(s): CHOL, HDL, LDLCALC, TRIG, CHOLHDL, LDLDIRECT in the last 72 hours. Thyroid Function Tests: No results for input(s): TSH, T4TOTAL, FREET4, T3FREE, THYROIDAB in the last 72 hours. Anemia Panel: No results for input(s): VITAMINB12, FOLATE, FERRITIN, TIBC, IRON, RETICCTPCT in the last 72 hours. Urine analysis:    Component Value Date/Time   COLORURINE YELLOW 03/12/2020 1809   APPEARANCEUR CLEAR 03/12/2020 1809   LABSPEC 1.014 03/12/2020 1809   PHURINE 5.0 03/12/2020 1809   GLUCOSEU NEGATIVE 03/12/2020 1809   HGBUR SMALL (A) 03/12/2020 1809   BILIRUBINUR NEGATIVE  03/12/2020 1809   KETONESUR NEGATIVE 03/12/2020 1809   PROTEINUR NEGATIVE 03/12/2020 1809   UROBILINOGEN 0.2 01/18/2014 0644   NITRITE NEGATIVE 03/12/2020 1809   LEUKOCYTESUR NEGATIVE 03/12/2020 1809   Sepsis Labs: @LABRCNTIP (procalcitonin:4,lacticidven:4) ) Recent Results (from the past 240 hour(s))  Resp Panel by RT-PCR (Flu A&B, Covid) Nasopharyngeal Swab     Status: None   Collection Time: 03/12/20  8:18 PM   Specimen: Nasopharyngeal Swab; Nasopharyngeal(NP) swabs in vial transport medium  Result Value Ref Range Status   SARS Coronavirus 2 by RT PCR NEGATIVE NEGATIVE Final    Comment: (NOTE) SARS-CoV-2 target nucleic acids are NOT DETECTED.  The SARS-CoV-2 RNA is generally detectable in upper respiratory specimens during the acute phase of infection. The lowest concentration of SARS-CoV-2 viral copies this assay can detect is 138 copies/mL. A negative result does not preclude SARS-Cov-2 infection and should not be used as the sole basis for treatment or other patient management decisions. A negative result may occur with  improper specimen collection/handling, submission of specimen other than nasopharyngeal swab, presence of viral mutation(s) within the areas targeted by this assay, and inadequate number of viral copies(<138 copies/mL). A negative result must be combined with clinical observations, patient history, and epidemiological information. The expected result is Negative.  Fact Sheet for Patients:  EntrepreneurPulse.com.au  Fact Sheet for Healthcare Providers:  IncredibleEmployment.be  This test is no t yet approved or cleared by the [place] FDA and  has been authorized for detection and/or diagnosis of SARS-CoV-2 by FDA under an Emergency Use Authorization (EUA). This EUA will remain  in effect (meaning this test can be used) for the duration of the COVID-19 declaration under Section 564(b)(1) of the Act,  21 U.S.C.section 360bbb-3(b)(1), unless the authorization is terminated  or revoked sooner.       Influenza A by PCR NEGATIVE NEGATIVE Final   Influenza B by PCR NEGATIVE NEGATIVE Final    Comment: (NOTE) The Xpert Xpress SARS-CoV-2/FLU/RSV plus assay is intended as an aid in the diagnosis of influenza from Nasopharyngeal swab specimens and should not be used as a sole basis for treatment. Nasal washings and aspirates are unacceptable for Xpert Xpress SARS-CoV-2/FLU/RSV testing.  Fact Sheet for Patients: EntrepreneurPulse.com.au  Fact Sheet for Healthcare Providers: IncredibleEmployment.be  This test is not yet approved  or cleared by the [place] and has been authorized for detection and/or diagnosis of SARS-CoV-2 by FDA under an Emergency Use Authorization (EUA). This EUA will remain in effect (meaning this test can be used) for the duration of the COVID-19 declaration under Section 564(b)(1) of the Act, 21 U.S.C. section 360bbb-3(b)(1), unless the authorization is terminated or revoked.  Performed at [facility], [place] [address] [place], [place] [address]      Radiological Exams on Admission: CT Head Wo Contrast  Result Date: 03/12/2020 CLINICAL DATA:  Headache. EXAM: CT HEAD WITHOUT CONTRAST TECHNIQUE: Contiguous axial images were obtained from the base of the skull through the vertex without intravenous contrast. COMPARISON:  June 10, 2018 FINDINGS: Brain: There is mild cerebral atrophy with widening of the extra-axial spaces and ventricular dilatation. There are areas of decreased attenuation within the white matter tracts of the supratentorial brain, consistent with microvascular disease changes. Vascular: No hyperdense vessel or unexpected calcification. Skull: Normal. Negative for fracture or focal lesion. Sinuses/Orbits: No acute finding. Other: None. IMPRESSION: Generalized cerebral atrophy without evidence of acute  intracranial pathology. Electronically Signed   By: Virgina Norfolk M.D.   On: 03/12/2020 18:48   MR BRAIN WO CONTRAST  Result Date: 03/12/2020 CLINICAL DATA:  Transient ischemic attack EXAM: MRI HEAD WITHOUT CONTRAST TECHNIQUE: Multiplanar, multiecho pulse sequences of the brain and surrounding structures were obtained without intravenous contrast. COMPARISON:  None. FINDINGS: Brain: No acute infarct, mass effect or extra-axial collection. No acute or chronic hemorrhage. Normal white matter signal, parenchymal volume and CSF spaces. The midline structures are normal. Vascular: Major flow voids are preserved. Skull and upper cervical spine: Normal calvarium and skull base. Visualized upper cervical spine and soft tissues are normal. Sinuses/Orbits:No paranasal sinus fluid levels or advanced mucosal thickening. No mastoid or middle ear effusion. Normal orbits. IMPRESSION: Normal brain MRI. Electronically Signed   By: Ulyses Jarred M.D.   On: 03/12/2020 21:19   DG CHEST PORT 1 VIEW  Result Date: 03/12/2020 CLINICAL DATA:  Dyspnea.  Headache and hypertension. EXAM: PORTABLE CHEST 1 VIEW COMPARISON:  06/10/2018 FINDINGS: Stable upper normal heart size. Normal mediastinal contours. Mild aortic atherosclerosis. No pulmonary edema, focal airspace disease, pleural effusion or pneumothorax. No acute osseous abnormalities are seen. IMPRESSION: No acute chest findings. Electronically Signed   By: Keith Rake M.D.   On: 03/12/2020 23:10    EKG: Independently reviewed. Sinus rhythm.   Assessment/Plan  1. Hypertensive crisis  - Presents with 2 days headache and confusion, reported to have SBP 220 at home and at outpatient clinic  - She was treated with clonidine in clinic and IV hydralazine in ED  - MRI brain is normal and symptoms resolved with BP control  - Continue her home ARB and nebivolol, use hydralazine as needed for goal SBP 165-180 overnight and then gradually aim for normal   2. Depression   - Continue home regimen   3. Hypothyroid  - Continue Synthroid    4. Rheumatoid arthritis  - Stable, not on DMARD     DVT prophylaxis: Lovenox  Code Status: Full  Level of Care: Level of care: Telemetry Medical Family Communication: None present  Disposition Plan:  Patient is from: Home  Anticipated d/c is to: Home  Anticipated d/c date is: 03/13/20 Patient currently: Pending BP-control  Consults called: none  Admission status: Observation     Vianne Bulls, MD [facility]  03/12/2020, 11:55 PM

## 2020-03-12 NOTE — ED Provider Notes (Signed)
[facility] EMERGENCY DEPARTMENT Provider Note   CSN: [id number] Arrival date & time: 03/12/20  1701     History Chief Complaint  Patient presents with  . Hypertension    Cynthia Mccullough is a 70 y.o. female.  HPI  Pt here after having intermittent confusion for the past 2-3 days. Went to [employer] and noticed that things "didn't look right" when she was walking around. [place] and felt confused on Tuesday. She called her son, Thurmond Butts, last night and told him about her confusion and elevated blood pressures at home. She reports systolic BP >892 at home. She went to her PCP's office today and they gave her two doses of Clonidine and sent her to the ED. Endorses headache, neck and shoulder pain. She dizziness at the doctors office today. Has had some cloudy vision with some of her episodes of confusion.      Past Medical History:  Diagnosis Date  . Allergic rhinitis   . Anxiety   . Asthma   . Depression   . Essential tremor   . HLD (hyperlipidemia)   . HTN (hypertension)   . Kidney stones   . Panic attacks     Patient Active Problem List   Diagnosis Date Noted  . Hypertensive crisis 03/12/2020  . S/P laparoscopic assisted vaginal hysterectomy (LAVH) 04/23/2013  . ALLERGIC CONJUNCTIVITIS 03/26/2007  . HYPERTENSION 03/26/2007  . Seasonal and perennial allergic rhinitis 03/26/2007  . Asthma, intermittent 03/26/2007  . URTICARIA 03/26/2007    Past Surgical History:  Procedure Laterality Date  . CERVICAL ABLATION    . DILATION AND CURETTAGE OF UTERUS    . EXCISION MORTON'S NEUROMA Right 2008  . FOOT SURGERY    . LAPAROSCOPIC ASSISTED VAGINAL HYSTERECTOMY N/A 04/23/2013   Procedure: LAPAROSCOPIC ASSISTED VAGINAL HYSTERECTOMY;  Surgeon: Cyril Mourning, MD;  Location: [facility];  Service: Gynecology;  Laterality: N/A;  . LIPOSUCTION    . SALPINGOOPHORECTOMY Bilateral 04/23/2013   Procedure: SALPINGO OOPHORECTOMY;  Surgeon:  Cyril Mourning, MD;  Location: [facility];  Service: Gynecology;  Laterality: Bilateral;     OB History   No obstetric history on file.     Family History  Problem Relation Age of Onset  . Heart attack Father   . Coronary artery disease Father   . Hypertension Father   . Diabetes Father   . Cancer - Prostate Father   . Cancer - Other Maternal Grandmother        breast and bone  . Cancer - Other Maternal Grandfather        liver    Social History   Tobacco Use  . Smoking status: Current Some Day Smoker    Years: 14.00    Types: Cigarettes    Last attempt to quit: 04/22/1980    Years since quitting: 39.9  . Smokeless tobacco: Never Used  . Tobacco comment: occasional cigs/ day  Vaping Use  . Vaping Use: Never used  Substance Use Topics  . Alcohol use: Yes    Alcohol/week: 2.0 standard drinks    Types: 2 Glasses of wine per week    Comment: white  . Drug use: No    Home Medications Prior to Admission medications   Medication Sig Start Date End Date Taking? Authorizing Provider  albuterol (PROVENTIL HFA;VENTOLIN HFA) 108 (90 Base) MCG/ACT inhaler Inhale 2 puffs into the lungs every 4 (four) hours as needed for wheezing or shortness of breath. 12/09/16  Forde Dandy, MD  ALPRAZolam Duanne Moron) 0.5 MG tablet Take 0.5-0.75 mg by mouth at bedtime.     [provider]  cetirizine (ZYRTEC) 10 MG tablet Take 10 mg by mouth at bedtime.     [provider]  cloNIDine (CATAPRES) 0.2 MG tablet Take 0.2 mg by mouth at bedtime.  12/25/12   [provider]  colestipol (COLESTID) 1 g tablet Take 1 tablet by mouth daily. 04/18/18   [provider]  cyanocobalamin (,VITAMIN B-12,) 1000 MCG/ML injection Inject 1 mL into the muscle every 30 (thirty) days. 04/17/18   [provider]  fenofibrate 160 MG tablet Take 160 mg by mouth at bedtime.     [provider]  furosemide (LASIX) 20 MG tablet Take 1 tablet (20 mg total) by mouth  daily. Patient not taking: Reported on 06/10/2018 12/09/16   Tanna Furry, MD  levothyroxine (SYNTHROID, LEVOTHROID) 50 MCG tablet Take 50 mcg by mouth daily before breakfast. 11/11/16   [provider]  nebivolol (BYSTOLIC) 10 MG tablet Take 10 mg by mouth at bedtime.    [provider]  olmesartan (BENICAR) 20 MG tablet Take 10 mg by mouth daily.     [provider]  pantoprazole (PROTONIX) 40 MG tablet Take 40 mg by mouth daily.    [provider]  Polyvinyl Alcohol-Povidone (REFRESH OP) Place 1 drop into both eyes daily as needed (dry eyes).     [provider]  potassium chloride (K-DUR) 10 MEQ tablet Take 1 tablet (10 mEq total) by mouth 2 (two) times daily. Patient not taking: Reported on 06/10/2018 12/09/16   Tanna Furry, MD  traZODone (DESYREL) 100 MG tablet Take 1 tablet (100 mg total) by mouth at bedtime. 12/09/16   Tanna Furry, MD  Vilazodone HCl (VIIBRYD) 40 MG TABS Take 40 mg by mouth daily.     [provider]  Vitamin D, Ergocalciferol, (DRISDOL) 1.25 MG (50000 UT) CAPS capsule Take 1 capsule by mouth once a week. 04/25/18   [provider]    Allergies    Amoxicillin, Metronidazole, Penicillins, Sulfonamide derivatives, Atorvastatin, Cheese, and Rosuvastatin  Review of Systems   Review of Systems  Constitutional: Negative for fever.  HENT: Negative for congestion, rhinorrhea and sore throat.   Respiratory: Negative for shortness of breath.   Cardiovascular: Negative for chest pain and leg swelling.  Gastrointestinal: Negative for diarrhea, nausea and vomiting. Abdominal pain: resolved.  Genitourinary: Negative for dysuria.  Musculoskeletal: Positive for neck pain.       Shoulder pain  Skin: Negative for rash.  Neurological: Positive for light-headedness and headaches. Negative for syncope.  Psychiatric/Behavioral: Positive for confusion.  All other systems reviewed and are negative.   Physical Exam Updated Vital  Signs BP (!) 153/81   Pulse 66   Temp 98.4 F (36.9 C) (Oral)   Resp 16   SpO2 96%   Physical Exam Vitals and nursing note reviewed.  Constitutional:      General: She is not in acute distress.    Appearance: Normal appearance. She is well-developed. She is not ill-appearing.  HENT:     Head: Normocephalic and atraumatic.     Nose: Nose normal. No rhinorrhea.     Mouth/Throat:     Mouth: Mucous membranes are moist.     Pharynx: No oropharyngeal exudate or posterior oropharyngeal erythema.  Eyes:     Extraocular Movements: Extraocular movements intact.     Conjunctiva/sclera: Conjunctivae normal.     Pupils:  Pupils are equal, round, and reactive to light.  Cardiovascular:     Rate and Rhythm: Normal rate and regular rhythm.     Pulses: Normal pulses.     Heart sounds: Normal heart sounds. No murmur heard.   Pulmonary:     Effort: Pulmonary effort is normal. No respiratory distress.     Breath sounds: Normal breath sounds. No wheezing, rhonchi or rales.  Abdominal:     General: Bowel sounds are normal.     Palpations: Abdomen is soft.     Tenderness: There is no abdominal tenderness. There is no guarding or rebound.  Musculoskeletal:        General: No swelling or tenderness. Normal range of motion.     Cervical back: Normal range of motion and neck supple. Tenderness: cervical paraspinal.     Right lower leg: No edema.     Left lower leg: No edema.  Skin:    General: Skin is warm and dry.     Capillary Refill: Capillary refill takes less than 2 seconds.  Neurological:     Mental Status: She is alert and oriented to person, place, and time.     Coordination: Coordination normal.     Comments: CN 2-12 grossly intact, sensation grossly intact, normal finger to nose, strength 5/5, tremor (hx of essential tremor)  Psychiatric:        Mood and Affect: Mood normal.        Behavior: Behavior normal.     ED Results / Procedures / Treatments   Labs (all labs ordered are  listed, but only abnormal results are displayed) Labs Reviewed  COMPREHENSIVE METABOLIC PANEL - Abnormal; Notable for the following components:      Result Value   Glucose, Bld 111 (*)    Total Protein 6.2 (*)    All other components within normal limits  CBC WITH DIFFERENTIAL/PLATELET - Abnormal; Notable for the following components:   MCV 103.4 (*)    MCH 34.1 (*)    All other components within normal limits  URINALYSIS, ROUTINE W REFLEX MICROSCOPIC - Abnormal; Notable for the following components:   Hgb urine dipstick SMALL (*)    Bacteria, UA RARE (*)    All other components within normal limits  RESP PANEL BY RT-PCR (FLU A&B, COVID) [id number]    EKG EKG Interpretation  Date/Time:  Friday March 12 2020 17:10:02 EST Ventricular Rate:  66 PR Interval:    QRS Duration: 101 QT Interval:  452 QTC Calculation: 474 R Axis:   21 Text Interpretation: Sinus rhythm RSR' in V1 or V2, probably normal variant Borderline T abnormalities, anterior leads No significant change since last tracing Confirmed by Wandra Arthurs ([id number]) on 03/12/2020 5:25:40 PM   Radiology CT Head Wo Contrast  Result Date: 03/12/2020 CLINICAL DATA:  Headache. EXAM: CT HEAD WITHOUT CONTRAST TECHNIQUE: Contiguous axial images were obtained from the base of the skull through the vertex without intravenous contrast. COMPARISON:  June 10, 2018 FINDINGS: Brain: There is mild cerebral atrophy with widening of the extra-axial spaces and ventricular dilatation. There are areas of decreased attenuation within the white matter tracts of the supratentorial brain, consistent with microvascular disease changes. Vascular: No hyperdense vessel or unexpected calcification. Skull: Normal. Negative for fracture or focal lesion. Sinuses/Orbits: No acute finding. Other: None. IMPRESSION: Generalized cerebral atrophy without evidence of acute intracranial pathology. Electronically Signed   By: Virgina Norfolk M.D.   On: 03/12/2020 18:48   MR  BRAIN WO CONTRAST  Result Date: 03/12/2020 CLINICAL DATA:  Transient ischemic attack EXAM: MRI HEAD WITHOUT CONTRAST TECHNIQUE: Multiplanar, multiecho pulse sequences of the brain and surrounding structures were obtained without intravenous contrast. COMPARISON:  None. FINDINGS: Brain: No acute infarct, mass effect or extra-axial collection. No acute or chronic hemorrhage. Normal white matter signal, parenchymal volume and CSF spaces. The midline structures are normal. Vascular: Major flow voids are preserved. Skull and upper cervical spine: Normal calvarium and skull base. Visualized upper cervical spine and soft tissues are normal. Sinuses/Orbits:No paranasal sinus fluid levels or advanced mucosal thickening. No mastoid or middle ear effusion. Normal orbits. IMPRESSION: Normal brain MRI. Electronically Signed   By: Ulyses Jarred M.D.   On: 03/12/2020 21:19    Procedures Procedures   Medications Ordered in ED Medications  hydrALAZINE (APRESOLINE) injection 5 mg (5 mg Intravenous Given 03/12/20 2118)    ED Course  I have reviewed the triage vital signs and the nursing notes.  Pertinent labs & imaging results that were available during my care of the patient were reviewed by me and considered in my medical decision making (see chart for details).  10:21 PM   Spoke with Dr.Oped regarding patients history, treatment and imaging results. Will admit.  10:35 PM  Patient updated regarding admission. Her son is taking care of his grandmother.      MDM Rules/Calculators/A&P                           Pt is 70 yo female with HTN, HLD, anxiety and depression who presented from PCP office for elevated blood pressures. Pt complains of intermittent confusion and will obtain CT Head. History concerning for PRES. May need MRI.   Labs grossly unremarkable. CT Head and MR Brain were grossly unremarkable. BP rebounded after Clonidine given by PCP. Pt treated with Hydralazine. Pt with intermittent confusion  with elevated BP. Systolic BP [phone] in the ED. Do not want normal BP in the first 24 hours. Plan to admit for hypertensive encephalopathy. [facility] Dr. Netta Neat, who will admit for observation.    Final Clinical Impression(s) / ED Diagnoses Final diagnoses:  Hypertensive encephalopathy    Rx / DC Orders ED Discharge Orders    None       Lyndee Hensen, DO 03/12/20 2238    Drenda Freeze, MD 03/15/20 [phone]

## 2020-03-12 NOTE — ED Triage Notes (Signed)
MD office, HTN, HA, feeling lethargic x 2 days, home systolic [phone], with hx HTN, same at MD today, given PO clonidine x 2 then called EMS, last BP 158/85, pulse 70, 02 sat 98% RA, HA improved, no stroke symptoms, still "not feeling right" with c/o dizziness.

## 2020-03-12 NOTE — ED Notes (Signed)
Physician at bedside.

## 2020-03-12 NOTE — ED Notes (Signed)
Patient transported to MRI 

## 2020-03-12 NOTE — H&P (Signed)
History and Physical    [name] [facility] LYY:[id number] DOB: 1950/03/08 DOA: 03/12/2020  PCP: Chesley Noon, MD   Patient coming from: Home   Chief Complaint: Headache, confusion, high BP   HPI: Cynthia Mccullough is a 70 y.o. female with medical history significant for hypertension, rheumatoid arthritis, hypothyroidism, depression, anxiety, insomnia, chronic diarrhea, who now presents emergency department for evaluation of elevated blood pressures, headache, and confusion.  Patient reports that for the past 2 days she had been experiencing some mild and vague sense of unwellness and then developed a headache and she felt confused.  She took her blood pressure at home and was concerned to see the systolic pressure in the 127N on repeat measurements.  She was seen at an outpatient clinic where she reports her blood pressure remained in the 170Y systolic, she was treated with clonidine, and sent to the ED.  She denies any focal numbness or weakness, does not believe that she missed any doses of her home antihypertensives, was not having any significant pain, but does report increased anxiety recently which she feels may be contributing.  She currently takes nebivolol and olmesartan for blood pressure, states that she has not been prescribed clonidine or Lasix in years.  She denies any chest pain, change in vision, or focal numbness or weakness.  ED Course: Upon arrival to the ED, patient is found to be afebrile, saturating well on room air, and with systolic blood pressure 174.  EKG features sinus rhythm.  MRI brain is normal.  Chemistry panel unremarkable and CBC notable for a macrocytosis without anemia.  COVID-19 screening test not yet resulted.  Patient was treated with IV hydralazine in the emergency department with improvement in blood pressure and resolution of her subjective complaints.  Review of Systems:  All other systems reviewed and apart from HPI, are negative.  Past Medical  History:  Diagnosis Date  . Allergic rhinitis   . Anxiety   . Asthma   . Depression   . Essential tremor   . HLD (hyperlipidemia)   . HTN (hypertension)   . Kidney stones   . Panic attacks     Past Surgical History:  Procedure Laterality Date  . CERVICAL ABLATION    . DILATION AND CURETTAGE OF UTERUS    . EXCISION MORTON'S NEUROMA Right 2008  . FOOT SURGERY    . LAPAROSCOPIC ASSISTED VAGINAL HYSTERECTOMY N/A 04/23/2013   Procedure: LAPAROSCOPIC ASSISTED VAGINAL HYSTERECTOMY;  Surgeon: Cyril Mourning, MD;  Location: [facility];  Service: Gynecology;  Laterality: N/A;  . LIPOSUCTION    . SALPINGOOPHORECTOMY Bilateral 04/23/2013   Procedure: SALPINGO OOPHORECTOMY;  Surgeon: Cyril Mourning, MD;  Location: [facility];  Service: Gynecology;  Laterality: Bilateral;    Social History:   reports that she has been smoking cigarettes. She has smoked for the past 14.00 years. She has never used smokeless tobacco. She reports current alcohol use of about 2.0 standard drinks of alcohol per week. She reports that she does not use drugs.  Allergies  Allergen Reactions  . Amoxicillin Hives  . Metronidazole Hives  . Penicillins Anaphylaxis    Has patient had a PCN reaction causing immediate rash, facial/tongue/throat swelling, SOB or lightheadedness with hypotension: Yes Has patient had a PCN reaction causing severe rash involving mucus membranes or skin necrosis: No Has patient had a PCN reaction that required hospitalization: Yes Has patient had a PCN reaction occurring within the last 10 years: No If all of the above answers  are "NO", then may proceed with Cephalosporin use.  . Sulfonamide Derivatives Hives    REACTION: urticaria  . Atorvastatin Other (See Comments)    Unknown reaction  . Cheese Hives    Reaction to aged cheeses - blue, gorgonzola  . Rosuvastatin Itching    Family History  Problem Relation Age of Onset  . Heart attack Father   . Coronary artery disease Father   .  Hypertension Father   . Diabetes Father   . Cancer - Prostate Father   . Cancer - Other Maternal Grandmother        breast and bone  . Cancer - Other Maternal Grandfather        liver     Prior to Admission medications   Medication Sig Start Date End Date Taking? Authorizing Provider  albuterol (PROVENTIL HFA;VENTOLIN HFA) 108 (90 Base) MCG/ACT inhaler Inhale 2 puffs into the lungs every 4 (four) hours as needed for wheezing or shortness of breath. 12/09/16   Forde Dandy, MD  ALPRAZolam Duanne Moron) 0.5 MG tablet Take 0.5-0.75 mg by mouth at bedtime.     [provider]  cetirizine (ZYRTEC) 10 MG tablet Take 10 mg by mouth at bedtime.     [provider]  cloNIDine (CATAPRES) 0.2 MG tablet Take 0.2 mg by mouth at bedtime.  12/25/12   [provider]  colestipol (COLESTID) 1 g tablet Take 1 tablet by mouth daily. 04/18/18   [provider]  cyanocobalamin (,VITAMIN B-12,) 1000 MCG/ML injection Inject 1 mL into the muscle every 30 (thirty) days. 04/17/18   [provider]  fenofibrate 160 MG tablet Take 160 mg by mouth at bedtime.     [provider]  furosemide (LASIX) 20 MG tablet Take 1 tablet (20 mg total) by mouth daily. Patient not taking: Reported on 06/10/2018 12/09/16   Tanna Furry, MD  levothyroxine (SYNTHROID, LEVOTHROID) 50 MCG tablet Take 50 mcg by mouth daily before breakfast. 11/11/16   [provider]  nebivolol (BYSTOLIC) 10 MG tablet Take 10 mg by mouth at bedtime.    [provider]  olmesartan (BENICAR) 20 MG tablet Take 10 mg by mouth daily.     [provider]  pantoprazole (PROTONIX) 40 MG tablet Take 40 mg by mouth daily.    [provider]  Polyvinyl Alcohol-Povidone (REFRESH OP) Place 1 drop into both eyes daily as needed (dry eyes).     [provider]  potassium chloride (K-DUR) 10 MEQ tablet Take 1 tablet (10 mEq total) by mouth 2 (two) times daily. Patient not taking:  Reported on 06/10/2018 12/09/16   Tanna Furry, MD  traZODone (DESYREL) 100 MG tablet Take 1 tablet (100 mg total) by mouth at bedtime. 12/09/16   Tanna Furry, MD  Vilazodone HCl (VIIBRYD) 40 MG TABS Take 40 mg by mouth daily.     [provider]  Vitamin D, Ergocalciferol, (DRISDOL) 1.25 MG (50000 UT) CAPS capsule Take 1 capsule by mouth once a week. 04/25/18   [provider]    Physical Exam: Vitals:   03/12/20 2004 03/12/20 2005 03/12/20 2105 03/12/20 2145  BP: (!) 155/78  (!) 153/87 (!) 153/81  Pulse: 71 64 67 66  Resp:  15 13 16   Temp:      TempSrc:      SpO2: 98% 99% 98% 96%    Constitutional: NAD, calm  Eyes: PERTLA, lids and conjunctivae normal ENMT: Mucous membranes are moist. Posterior pharynx clear of any exudate  or lesions.   Neck: normal, supple, no masses, no thyromegaly Respiratory: no wheezing, no crackles. No accessory muscle use.  Cardiovascular: S1 & S2 heard, regular rate and rhythm. No extremity edema.  Abdomen: No distension, no tenderness, soft. Bowel sounds active.  Musculoskeletal: no clubbing / cyanosis. No joint deformity upper and lower extremities.   Skin: no significant rashes, lesions, ulcers. Warm, dry, well-perfused. Neurologic: CN 2-12 grossly intact. Sensation intact. Strength 5/5 in all 4 limbs. Resting UE tremor.  Psychiatric: Alert and oriented to person, place, and situation. Pleasant and cooperative.    Labs and Imaging on Admission: I have personally reviewed following labs and imaging studies  CBC: Recent Labs  Lab 03/12/20 1741  WBC 8.6  NEUTROABS 5.6  HGB 14.0  HCT 42.4  MCV 103.4*  PLT 709   Basic Metabolic Panel: Recent Labs  Lab 03/12/20 1741  NA 136  K 3.8  CL 99  CO2 28  GLUCOSE 111*  BUN 9  CREATININE 0.73  CALCIUM 9.3   GFR: CrCl cannot be calculated (Unknown ideal weight.). Liver Function Tests: Recent Labs  Lab 03/12/20 1741  AST 16  ALT 23  ALKPHOS 50  BILITOT 0.9  PROT 6.2*  ALBUMIN  3.6   No results for input(s): LIPASE, AMYLASE in the last 168 hours. No results for input(s): AMMONIA in the last 168 hours. Coagulation Profile: No results for input(s): INR, PROTIME in the last 168 hours. Cardiac Enzymes: No results for input(s): CKTOTAL, CKMB, CKMBINDEX, TROPONINI in the last 168 hours. BNP (last 3 results) No results for input(s): PROBNP in the last 8760 hours. HbA1C: No results for input(s): HGBA1C in the last 72 hours. CBG: No results for input(s): GLUCAP in the last 168 hours. Lipid Profile: No results for input(s): CHOL, HDL, LDLCALC, TRIG, CHOLHDL, LDLDIRECT in the last 72 hours. Thyroid Function Tests: No results for input(s): TSH, T4TOTAL, FREET4, T3FREE, THYROIDAB in the last 72 hours. Anemia Panel: No results for input(s): VITAMINB12, FOLATE, FERRITIN, TIBC, IRON, RETICCTPCT in the last 72 hours. Urine analysis:    Component Value Date/Time   COLORURINE YELLOW 03/12/2020 1809   APPEARANCEUR CLEAR 03/12/2020 1809   LABSPEC 1.014 03/12/2020 1809   PHURINE 5.0 03/12/2020 1809   GLUCOSEU NEGATIVE 03/12/2020 1809   HGBUR SMALL (A) 03/12/2020 1809   BILIRUBINUR NEGATIVE 03/12/2020 1809   KETONESUR NEGATIVE 03/12/2020 1809   PROTEINUR NEGATIVE 03/12/2020 1809   UROBILINOGEN 0.2 01/18/2014 0644   NITRITE NEGATIVE 03/12/2020 1809   LEUKOCYTESUR NEGATIVE 03/12/2020 1809   Sepsis Labs: @LABRCNTIP (procalcitonin:4,lacticidven:4) ) Recent Results (from the past 240 hour(s))  Resp Panel by RT-PCR (Flu A&B, Covid) Nasopharyngeal Swab     Status: None   Collection Time: 03/12/20  8:18 PM   Specimen: Nasopharyngeal Swab; Nasopharyngeal(NP) swabs in vial transport medium  Result Value Ref Range Status   SARS Coronavirus 2 by RT PCR NEGATIVE NEGATIVE Final    Comment: (NOTE) SARS-CoV-2 target nucleic acids are NOT DETECTED.  The SARS-CoV-2 RNA is generally detectable in upper respiratory specimens during the acute phase of infection. The  lowest concentration of SARS-CoV-2 viral copies this assay can detect is 138 copies/mL. A negative result does not preclude SARS-Cov-2 infection and should not be used as the sole basis for treatment or other patient management decisions. A negative result may occur with  improper specimen collection/handling, submission of specimen other than nasopharyngeal swab, presence of viral mutation(s) within the areas targeted by this assay, and inadequate number of viral copies(<138 copies/mL). A  negative result must be combined with clinical observations, patient history, and epidemiological information. The expected result is Negative.  Fact Sheet for Patients:  EntrepreneurPulse.com.au  Fact Sheet for Healthcare Providers:  IncredibleEmployment.be  This test is no t yet approved or cleared by the [place] FDA and  has been authorized for detection and/or diagnosis of SARS-CoV-2 by FDA under an Emergency Use Authorization (EUA). This EUA will remain  in effect (meaning this test can be used) for the duration of the COVID-19 declaration under Section 564(b)(1) of the Act, 21 U.S.C.section 360bbb-3(b)(1), unless the authorization is terminated  or revoked sooner.       Influenza A by PCR NEGATIVE NEGATIVE Final   Influenza B by PCR NEGATIVE NEGATIVE Final    Comment: (NOTE) The Xpert Xpress SARS-CoV-2/FLU/RSV plus assay is intended as an aid in the diagnosis of influenza from Nasopharyngeal swab specimens and should not be used as a sole basis for treatment. Nasal washings and aspirates are unacceptable for Xpert Xpress SARS-CoV-2/FLU/RSV testing.  Fact Sheet for Patients: EntrepreneurPulse.com.au  Fact Sheet for Healthcare Providers: IncredibleEmployment.be  This test is not yet approved or cleared by the [place] FDA and has been authorized for detection and/or diagnosis of SARS-CoV-2 by FDA under  an Emergency Use Authorization (EUA). This EUA will remain in effect (meaning this test can be used) for the duration of the COVID-19 declaration under Section 564(b)(1) of the Act, 21 U.S.C. section 360bbb-3(b)(1), unless the authorization is terminated or revoked.  Performed at [facility], [place] [address] [place], [place] [address]      Radiological Exams on Admission: CT Head Wo Contrast  Result Date: 03/12/2020 CLINICAL DATA:  Headache. EXAM: CT HEAD WITHOUT CONTRAST TECHNIQUE: Contiguous axial images were obtained from the base of the skull through the vertex without intravenous contrast. COMPARISON:  June 10, 2018 FINDINGS: Brain: There is mild cerebral atrophy with widening of the extra-axial spaces and ventricular dilatation. There are areas of decreased attenuation within the white matter tracts of the supratentorial brain, consistent with microvascular disease changes. Vascular: No hyperdense vessel or unexpected calcification. Skull: Normal. Negative for fracture or focal lesion. Sinuses/Orbits: No acute finding. Other: None. IMPRESSION: Generalized cerebral atrophy without evidence of acute intracranial pathology. Electronically Signed   By: Virgina Norfolk M.D.   On: 03/12/2020 18:48   MR BRAIN WO CONTRAST  Result Date: 03/12/2020 CLINICAL DATA:  Transient ischemic attack EXAM: MRI HEAD WITHOUT CONTRAST TECHNIQUE: Multiplanar, multiecho pulse sequences of the brain and surrounding structures were obtained without intravenous contrast. COMPARISON:  None. FINDINGS: Brain: No acute infarct, mass effect or extra-axial collection. No acute or chronic hemorrhage. Normal white matter signal, parenchymal volume and CSF spaces. The midline structures are normal. Vascular: Major flow voids are preserved. Skull and upper cervical spine: Normal calvarium and skull base. Visualized upper cervical spine and soft tissues are normal. Sinuses/Orbits:No paranasal sinus fluid levels or advanced  mucosal thickening. No mastoid or middle ear effusion. Normal orbits. IMPRESSION: Normal brain MRI. Electronically Signed   By: Ulyses Jarred M.D.   On: 03/12/2020 21:19   DG CHEST PORT 1 VIEW  Result Date: 03/12/2020 CLINICAL DATA:  Dyspnea.  Headache and hypertension. EXAM: PORTABLE CHEST 1 VIEW COMPARISON:  06/10/2018 FINDINGS: Stable upper normal heart size. Normal mediastinal contours. Mild aortic atherosclerosis. No pulmonary edema, focal airspace disease, pleural effusion or pneumothorax. No acute osseous abnormalities are seen. IMPRESSION: No acute chest findings. Electronically Signed   By: Aurther Loft.D.  On: 03/12/2020 23:10    EKG: Independently reviewed. Sinus rhythm.   Assessment/Plan  1. Hypertensive crisis  - Presents with 2 days headache and confusion, reported to have SBP 220 at home and at outpatient clinic  - She was treated with clonidine in clinic and IV hydralazine in ED  - MRI brain is normal and symptoms resolved with BP control  - Continue her home ARB and nebivolol, use hydralazine as needed for goal SBP 165-180 overnight and then gradually aim for normal   2. Depression  - Continue home regimen   3. Hypothyroid  - Continue Synthroid    4. Rheumatoid arthritis  - Stable, not on DMARD     DVT prophylaxis: Lovenox  Code Status: Full  Level of Care: Level of care: Telemetry Medical Family Communication: None present  Disposition Plan:  Patient is from: Home  Anticipated d/c is to: Home  Anticipated d/c date is: 03/13/20 Patient currently: Pending BP-control  Consults called: none  Admission status: Observation     Vianne Bulls, MD [facility]  03/12/2020, 11:55 PM

## 2020-03-12 NOTE — ED Notes (Signed)
Introduced self to pt. Son at bedside. Pt A&Ox4, calm, cooperative denying pain, dizziness, headache, nausea, vision changes. Plan of care discussed. Pt ambulated to restroom without difficulty. Will continue to monitor.

## 2020-03-13 ENCOUNTER — Other Ambulatory Visit: Payer: Self-pay

## 2020-03-13 DIAGNOSIS — I169 Hypertensive crisis, unspecified: Secondary | ICD-10-CM | POA: Diagnosis not present

## 2020-03-13 DIAGNOSIS — M059 Rheumatoid arthritis with rheumatoid factor, unspecified: Secondary | ICD-10-CM | POA: Insufficient documentation

## 2020-03-13 LAB — CBC
HCT: 43.5 % (ref 36.0–46.0)
Hemoglobin: 14.8 g/dL (ref 12.0–15.0)
MCH: 34.5 pg — ABNORMAL HIGH (ref 26.0–34.0)
MCHC: 34 g/dL (ref 30.0–36.0)
MCV: 101.4 fL — ABNORMAL HIGH (ref 80.0–100.0)
Platelets: 185 10*3/uL (ref 150–400)
RBC: 4.29 MIL/uL (ref 3.87–5.11)
RDW: 12.7 % (ref 11.5–15.5)
WBC: 5.7 10*3/uL (ref 4.0–10.5)
nRBC: 0 % (ref 0.0–0.2)

## 2020-03-13 LAB — BASIC METABOLIC PANEL
Anion gap: 9 (ref 5–15)
BUN: 11 mg/dL (ref 8–23)
CO2: 31 mmol/L (ref 22–32)
Calcium: 9.7 mg/dL (ref 8.9–10.3)
Chloride: 98 mmol/L (ref 98–111)
Creatinine, Ser: 0.81 mg/dL (ref 0.44–1.00)
GFR, Estimated: >60 mL/min (ref >60)
Glucose, Bld: 102 mg/dL — ABNORMAL HIGH (ref 70–99)
Potassium: 3.6 mmol/L (ref 3.5–5.1)
Sodium: 138 mmol/L (ref 135–145)

## 2020-03-13 LAB — HIV ANTIBODY (ROUTINE TESTING W REFLEX): HIV Screen 4th Generation wRfx: NONREACTIVE

## 2020-03-13 MED ORDER — INFLUENZA VAC A&B SA ADJ QUAD 0.5 ML IM PRSY
0.5000 mL | PREFILLED_SYRINGE | INTRAMUSCULAR | Status: DC
  Filled 2020-03-13: qty 0.5

## 2020-03-13 NOTE — Care Management Obs Status (Signed)
[facility] NOTIFICATION   Patient Details  Name: Cynthia Mccullough MRN: [id number] Date of Birth: September 02, 1950   Medicare Observation Status Notification Given:  Yes    Carles Collet, RN 03/13/2020, 4:14 PM

## 2020-03-13 NOTE — Progress Notes (Signed)
Arrived to unit at 0500 from ED. VSS. Room air. OOB ambulating in room. C/o headache, given PRN pain meds. No other complaints at this time.

## 2020-03-13 NOTE — Progress Notes (Incomplete)
hi, she is having cramping in her feet shooting up to the calf muscle area. Going to give PRN norco. Electrolytes look ok. She is wondering what might be the cause. BLE neurovascular intact.

## 2020-03-13 NOTE — Progress Notes (Signed)
PROGRESS NOTE    Cynthia Mccullough [facility]  PJA:[id number] DOB: 1950/04/21 DOA: 03/12/2020 PCP: Chesley Noon, MD   Brief Narrative: This 70 years old female with PMH significant for hypertension, rheumatoid arthritis, hypothyroidism, depression, anxiety, insomnia, chronic diarrhea presents in the ER with elevated blood pressure, headache and confusion.  Patient reports her blood pressure has been uncontrolled for last 2 days.  She went to see her primary care physician where her blood pressure was found to be above 341 systolic,  she was treated with clonidine and was sent to the ED.  She denies any focal numbness or weakness.  She reports being very compliant with her antihypertensive medications.  She currently olmesartan and nebivilol for blood pressure.  MRI brain is normal,  labs unremarkable.  EKG shows sinus rhythm.  Patient is admitted for hypertensive urgency.  Blood pressure has been improving.  Assessment & Plan:   Principal Problem:   Hypertensive crisis Active Problems:   Asthma, intermittent   Depression   Hypothyroid  Accelerated hypertension - Presents with 2 days hx. of headache and confusion, reported to have SBP 220 at home and at outpatient clinic  - She was treated with clonidine in clinic and IV hydralazine in ED  - MRI brain is normal and symptoms resolved with BP control. - Continue her home ARB and nebivolol, use hydralazine as needed for goal SBP 165-180 overnight and then gradually aim for normal  - She continues to remain dizzy, blood pressure is improving.  Depression  - Continue home regimen   Hypothyroidism - Continue Synthroid    Rheumatoid arthritis  - Stable, not on DMARD     DVT prophylaxis: Lovenox Code Status: Full code Family Communication: No family at bedside Disposition Plan:  Status is: Observation  The patient remains OBS appropriate and will d/c before 2 midnights.  Dispo: The patient is from: Home              Anticipated d/c  is to: Home              Patient currently is not medically stable to d/c.   Difficult to place patient No   Consultants:    None  Procedures:  Antimicrobials: Anti-infectives (From admission, onward)   None     Subjective: Patient was seen and examined at bedside.  Overnight events noted.  Patient reports feeling improved, blood pressure is improving but she still remains dizzy.  Objective: Vitals:   03/13/20 0725 03/13/20 1207 03/13/20 1219 03/13/20 1450  BP: (!) 144/72 (!) 152/83 (!) 152/83   Pulse: 63 65 60   Resp: 15  15 18   Temp: 98.4 F (36.9 C)  98.9 F (37.2 C)   TempSrc: Oral  Oral   SpO2: 96%  95%   Weight:      Height:       No intake or output data in the 24 hours ending 03/13/20 1513 Filed Weights   03/13/20 0502  Weight: 85.7 kg    Examination:  General exam: Appears calm and comfortable, not in any acute distress. Respiratory system: Clear to auscultation. Respiratory effort normal. Cardiovascular system: S1 & S2 heard, RRR. No JVD, murmurs, rubs, gallops or clicks. No pedal edema. Gastrointestinal system: Abdomen is nondistended, soft and nontender. No organomegaly or masses felt. Normal bowel sounds heard. Central nervous system: Alert and oriented. No focal neurological deficits. Extremities: Symmetric 5 x 5 power.  No edema, no cyanosis, no clubbing. Skin: No rashes, lesions or ulcers Psychiatry:  Judgement and insight appear normal. Mood & affect appropriate.     Data Reviewed: I have personally reviewed following labs and imaging studies  CBC: Recent Labs  Lab 03/12/20 1741 03/13/20 0515  WBC 8.6 5.7  NEUTROABS 5.6  --   HGB 14.0 14.8  HCT 42.4 43.5  MCV 103.4* 101.4*  PLT 185 329   Basic Metabolic Panel: Recent Labs  Lab 03/12/20 1741 03/13/20 0515  NA 136 138  K 3.8 3.6  CL 99 98  CO2 28 31  GLUCOSE 111* 102*  BUN 9 11  CREATININE 0.73 0.81  CALCIUM 9.3 9.7   GFR: Estimated Creatinine Clearance: 77.2 mL/min (by  C-G formula based on SCr of 0.81 mg/dL). Liver Function Tests: Recent Labs  Lab 03/12/20 1741  AST 16  ALT 23  ALKPHOS 50  BILITOT 0.9  PROT 6.2*  ALBUMIN 3.6   No results for input(s): LIPASE, AMYLASE in the last 168 hours. No results for input(s): AMMONIA in the last 168 hours. Coagulation Profile: No results for input(s): INR, PROTIME in the last 168 hours. Cardiac Enzymes: No results for input(s): CKTOTAL, CKMB, CKMBINDEX, TROPONINI in the last 168 hours. BNP (last 3 results) No results for input(s): PROBNP in the last 8760 hours. HbA1C: No results for input(s): HGBA1C in the last 72 hours. CBG: No results for input(s): GLUCAP in the last 168 hours. Lipid Profile: No results for input(s): CHOL, HDL, LDLCALC, TRIG, CHOLHDL, LDLDIRECT in the last 72 hours. Thyroid Function Tests: No results for input(s): TSH, T4TOTAL, FREET4, T3FREE, THYROIDAB in the last 72 hours. Anemia Panel: No results for input(s): VITAMINB12, FOLATE, FERRITIN, TIBC, IRON, RETICCTPCT in the last 72 hours. Sepsis Labs: No results for input(s): PROCALCITON, LATICACIDVEN in the last 168 hours.  Recent Results (from the past 240 hour(s))  Resp Panel by RT-PCR (Flu A&B, Covid) Nasopharyngeal Swab     Status: None   Collection Time: 03/12/20  8:18 PM   Specimen: Nasopharyngeal Swab; Nasopharyngeal(NP) swabs in vial transport medium  Result Value Ref Range Status   SARS Coronavirus 2 by RT PCR NEGATIVE NEGATIVE Final    Comment: (NOTE) SARS-CoV-2 target nucleic acids are NOT DETECTED.  The SARS-CoV-2 RNA is generally detectable in upper respiratory specimens during the acute phase of infection. The lowest concentration of SARS-CoV-2 viral copies this assay can detect is 138 copies/mL. A negative result does not preclude SARS-Cov-2 infection and should not be used as the sole basis for treatment or other patient management decisions. A negative result may occur with  improper specimen  collection/handling, submission of specimen other than nasopharyngeal swab, presence of viral mutation(s) within the areas targeted by this assay, and inadequate number of viral copies(<138 copies/mL). A negative result must be combined with clinical observations, patient history, and epidemiological information. The expected result is Negative.  Fact Sheet for Patients:  EntrepreneurPulse.com.au  Fact Sheet for Healthcare Providers:  IncredibleEmployment.be  This test is no t yet approved or cleared by the [place] FDA and  has been authorized for detection and/or diagnosis of SARS-CoV-2 by FDA under an Emergency Use Authorization (EUA). This EUA will remain  in effect (meaning this test can be used) for the duration of the COVID-19 declaration under Section 564(b)(1) of the Act, 21 U.S.C.section 360bbb-3(b)(1), unless the authorization is terminated  or revoked sooner.       Influenza A by PCR NEGATIVE NEGATIVE Final   Influenza B by PCR NEGATIVE NEGATIVE Final    Comment: (NOTE) The Xpert Xpress  SARS-CoV-2/FLU/RSV plus assay is intended as an aid in the diagnosis of influenza from Nasopharyngeal swab specimens and should not be used as a sole basis for treatment. Nasal washings and aspirates are unacceptable for Xpert Xpress SARS-CoV-2/FLU/RSV testing.  Fact Sheet for Patients: EntrepreneurPulse.com.au  Fact Sheet for Healthcare Providers: IncredibleEmployment.be  This test is not yet approved or cleared by the [place] FDA and has been authorized for detection and/or diagnosis of SARS-CoV-2 by FDA under an Emergency Use Authorization (EUA). This EUA will remain in effect (meaning this test can be used) for the duration of the COVID-19 declaration under Section 564(b)(1) of the Act, 21 U.S.C. section 360bbb-3(b)(1), unless the authorization is terminated or revoked.  Performed at [facility], [place] [address] [place], [place] [address]     Radiology Studies: CT Head Wo Contrast  Result Date: 03/12/2020 CLINICAL DATA:  Headache. EXAM: CT HEAD WITHOUT CONTRAST TECHNIQUE: Contiguous axial images were obtained from the base of the skull through the vertex without intravenous contrast. COMPARISON:  June 10, 2018 FINDINGS: Brain: There is mild cerebral atrophy with widening of the extra-axial spaces and ventricular dilatation. There are areas of decreased attenuation within the white matter tracts of the supratentorial brain, consistent with microvascular disease changes. Vascular: No hyperdense vessel or unexpected calcification. Skull: Normal. Negative for fracture or focal lesion. Sinuses/Orbits: No acute finding. Other: None. IMPRESSION: Generalized cerebral atrophy without evidence of acute intracranial pathology. Electronically Signed   By: Virgina Norfolk M.D.   On: 03/12/2020 18:48   MR BRAIN WO CONTRAST  Result Date: 03/12/2020 CLINICAL DATA:  Transient ischemic attack EXAM: MRI HEAD WITHOUT CONTRAST TECHNIQUE: Multiplanar, multiecho pulse sequences of the brain and surrounding structures were obtained without intravenous contrast. COMPARISON:  None. FINDINGS: Brain: No acute infarct, mass effect or extra-axial collection. No acute or chronic hemorrhage. Normal white matter signal, parenchymal volume and CSF spaces. The midline structures are normal. Vascular: Major flow voids are preserved. Skull and upper cervical spine: Normal calvarium and skull base. Visualized upper cervical spine and soft tissues are normal. Sinuses/Orbits:No paranasal sinus fluid levels or advanced mucosal thickening. No mastoid or middle ear effusion. Normal orbits. IMPRESSION: Normal brain MRI. Electronically Signed   By: Ulyses Jarred M.D.   On: 03/12/2020 21:19   DG CHEST PORT 1 VIEW  Result Date: 03/12/2020 CLINICAL DATA:  Dyspnea.  Headache and hypertension. EXAM: PORTABLE CHEST 1 VIEW  COMPARISON:  06/10/2018 FINDINGS: Stable upper normal heart size. Normal mediastinal contours. Mild aortic atherosclerosis. No pulmonary edema, focal airspace disease, pleural effusion or pneumothorax. No acute osseous abnormalities are seen. IMPRESSION: No acute chest findings. Electronically Signed   By: Keith Rake M.D.   On: 03/12/2020 23:10   Scheduled Meds: . buPROPion  150 mg Oral BID WC  . colestipol  1 g Oral Daily  . enoxaparin (LOVENOX) injection  40 mg Subcutaneous Q24H  . [START ON 03/14/2020] influenza vaccine adjuvanted  0.5 mL Intramuscular Tomorrow-1000  . irbesartan  150 mg Oral Daily  . levothyroxine  50 mcg Oral QAC breakfast  . nebivolol  10 mg Oral QHS  . traZODone  100 mg Oral QHS  . Vilazodone HCl  40 mg Oral Daily   Continuous Infusions: . sodium chloride       LOS: 0 days    Time spent: 25 mins    Shawna Clamp, MD [facility]   If 7PM-7AM, please contact night-coverage

## 2020-03-14 DIAGNOSIS — I169 Hypertensive crisis, unspecified: Secondary | ICD-10-CM | POA: Diagnosis not present

## 2020-03-14 MED ORDER — AMLODIPINE BESYLATE 5 MG PO TABS
5.0000 mg | ORAL_TABLET | Freq: Every day | ORAL | Status: DC
  Administered 2020-03-14: 5 mg via ORAL
  Filled 2020-03-14: qty 1

## 2020-03-14 MED ORDER — IRBESARTAN 150 MG PO TABS: 150.0000 mg | ORAL_TABLET | Freq: Every day | ORAL | 1 refills | Status: DC

## 2020-03-14 MED ORDER — AMLODIPINE BESYLATE 5 MG PO TABS: 5.0000 mg | ORAL_TABLET | Freq: Every day | ORAL | 1 refills | Status: AC

## 2020-03-14 NOTE — Discharge Summary (Signed)
Physician Discharge Summary  Cynthia Mccullough [facility] HDQ:[id number] DOB: 1950-09-15 DOA: 03/12/2020  PCP: Chesley Noon, MD  Admit date: 03/12/2020   Discharge date: 03/14/2020  Admitted From: Home Disposition:  Home  Recommendations for Outpatient Follow-up:  1. Follow up with PCP in 1-2 weeks. 2. Please obtain BMP/CBC in one week 3. Advised to take Benicar 20 mg daily for blood pressure control. 4. Advised to continue nebivolol as prescribed. Advised to take amlodipine 5 mg daily 5. Advised to check blood pressure and follow-up with primary care physician. 6. We will request primary care physician to schedule her for outpatient echocardiogram.  Home Health: None Equipment/Devices: None  Discharge Condition: Stable CODE STATUS:Full code Diet recommendation: Heart Healthy   [facility] Course: This 70 years old female with PMH significant for hypertension, rheumatoid arthritis, hypothyroidism, depression, anxiety, insomnia, chronic diarrhea presents in the ER with elevated blood pressure, headache and confusion.  Patient reports her blood pressure has been uncontrolled for last 2 days.  She went to see her primary care physician where her blood pressure was found to be above 119 systolic,  she was treated with clonidine and was sent to the ED.  She denies any focal numbness or weakness.  She reports being very compliant with her antihypertensive medications.  She currently olmesartan and nebivilol for blood pressure.  She was admitted for observation for hypertensive urgency. MRI brain is normal,  labs unremarkable.  EKG shows sinus rhythm.  She was started on amlodipine 5 mg.  Her blood pressure has been slowly improving.  Patient denies any chest pain, shortness of breath, dizziness, palpitations.  Patient feels better and wants to be discharged.  Patient is being discharged home.  we will request primary care physician to schedule outpatient echocardiogram.  She was managed  for below problems.  Discharge Diagnoses:  Principal Problem:   Hypertensive crisis Active Problems:   Asthma, intermittent   Depression   Hypothyroid  Accelerated hypertension >> Improving -Presents with 2 days hx. of headache and confusion, reported to have SBP 220 at home and at outpatient clinic  - She was treated with clonidine in clinic and IV hydralazine in ED -MRI brain is normal and symptoms resolved with BP control. -Continue her home ARB and nebivolol, use hydralazine as needed for goal SBP 165-180 overnight and then gradually aim for normal - She continues to remain dizzy, blood pressure is improving. - Needs outpatient echocardiogram scheduled  Depression -Continue home regimen.  Hypothyroidism -Continue Synthroid  Rheumatoid arthritis -Stable, not on DMARD  Discharge Instructions  Discharge Instructions    Call MD for:  difficulty breathing, headache or visual disturbances   Complete by: As directed    Call MD for:  persistant dizziness or light-headedness   Complete by: As directed    Call MD for:  persistant nausea and vomiting   Complete by: As directed    Diet - low sodium heart healthy   Complete by: As directed    Diet Carb Modified   Complete by: As directed    Discharge instructions   Complete by: As directed    Advised to follow-up with primary care physician in 1 week. Advised to take Avapro 150 mg daily for blood pressure control. Advised to continue nebivolol as prescribed. Advised to check blood pressure and follow-up with primary care physician. We will request primary care physician to schedule her for outpatient echocardiogram.   Increase activity slowly   Complete by: As directed  Allergies as of 03/14/2020      Reactions   Amoxicillin Hives   Metronidazole Hives   Penicillins Anaphylaxis   Has patient had a PCN reaction causing immediate rash, facial/tongue/throat swelling, SOB or lightheadedness with  hypotension: Yes Has patient had a PCN reaction causing severe rash involving mucus membranes or skin necrosis: No Has patient had a PCN reaction that required hospitalization: Yes Has patient had a PCN reaction occurring within the last 10 years: No If all of the above answers are "NO", then may proceed with Cephalosporin use.   Sulfonamide Derivatives Hives   REACTION: urticaria   Cheese Hives   Reaction to aged cheeses - blue, gorgonzola   Atorvastatin Other (See Comments)   Unknown reaction   Rosuvastatin Itching      Medication List    TAKE these medications   albuterol 108 (90 Base) MCG/ACT inhaler Commonly known as: VENTOLIN HFA Inhale 2 puffs into the lungs every 4 (four) hours as needed for wheezing or shortness of breath.   allopurinol 100 MG tablet Commonly known as: ZYLOPRIM Take 100 mg by mouth 3 (three) times daily.   ALPRAZolam 0.5 MG tablet Commonly known as: XANAX Take 0.5-0.75 mg by mouth at bedtime.   Beelith 362-20 MG Tabs tablet Generic drug: magnesium oxide-pyridoxine Take 1 tablet by mouth daily.   budesonide 3 MG 24 hr capsule Commonly known as: ENTOCORT EC Take 9 mg by mouth daily.   buPROPion 150 MG 12 hr tablet Commonly known as: WELLBUTRIN SR Take 150 mg by mouth 2 (two) times daily.   cetirizine 10 MG tablet Commonly known as: ZYRTEC Take 10 mg by mouth at bedtime.   colestipol 1 g tablet Commonly known as: COLESTID Take 1 tablet by mouth daily.   cyanocobalamin 1000 MCG/ML injection Commonly known as: (VITAMIN B-12) Inject 1 mL into the muscle every 30 (thirty) days.   fenofibrate 160 MG tablet Take 160 mg by mouth at bedtime.   furosemide 20 MG tablet Commonly known as: LASIX Take 1 tablet (20 mg total) by mouth daily.   irbesartan 150 MG tablet Commonly known as: AVAPRO Take 1 tablet (150 mg total) by mouth daily. Start taking on: March 15, 2020   latanoprost 0.005 % ophthalmic solution Commonly known as: XALATAN Place  1 drop into both eyes at bedtime.   levothyroxine 50 MCG tablet Commonly known as: SYNTHROID Take 50 mcg by mouth daily before breakfast.   nebivolol 10 MG tablet Commonly known as: BYSTOLIC Take 10 mg by mouth at bedtime.   pantoprazole 40 MG tablet Commonly known as: PROTONIX Take 40 mg by mouth daily.   potassium chloride 10 MEQ tablet Commonly known as: KLOR-CON Take 1 tablet (10 mEq total) by mouth 2 (two) times daily.   REFRESH OP Place 1 drop into both eyes daily as needed (dry eyes).   traZODone 100 MG tablet Commonly known as: DESYREL Take 1 tablet (100 mg total) by mouth at bedtime.   Viibryd 40 MG Tabs Generic drug: Vilazodone HCl Take 40 mg by mouth daily.   Vitamin D (Ergocalciferol) 1.25 MG (50000 UNIT) Caps capsule Commonly known as: DRISDOL Take 1 capsule by mouth once a week.   zaleplon 10 MG capsule Commonly known as: SONATA Take 2 capsules by mouth at bedtime.       Follow-up Information    Chesley Noon, MD Follow up in 1 week(s).   Specialty: Family Medicine Contact information: [address] [place] [address] [phone]  Allergies  Allergen Reactions   Amoxicillin Hives   Metronidazole Hives   Penicillins Anaphylaxis    Has patient had a PCN reaction causing immediate rash, facial/tongue/throat swelling, SOB or lightheadedness with hypotension: Yes Has patient had a PCN reaction causing severe rash involving mucus membranes or skin necrosis: No Has patient had a PCN reaction that required hospitalization: Yes Has patient had a PCN reaction occurring within the last 10 years: No If all of the above answers are "NO", then may proceed with Cephalosporin use.   Sulfonamide Derivatives Hives    REACTION: urticaria   Cheese Hives    Reaction to aged cheeses - blue, gorgonzola   Atorvastatin Other (See Comments)    Unknown reaction   Rosuvastatin Itching     Consultations:  None   Procedures/Studies: CT Head Wo Contrast  Result Date: 03/12/2020 CLINICAL DATA:  Headache. EXAM: CT HEAD WITHOUT CONTRAST TECHNIQUE: Contiguous axial images were obtained from the base of the skull through the vertex without intravenous contrast. COMPARISON:  June 10, 2018 FINDINGS: Brain: There is mild cerebral atrophy with widening of the extra-axial spaces and ventricular dilatation. There are areas of decreased attenuation within the white matter tracts of the supratentorial brain, consistent with microvascular disease changes. Vascular: No hyperdense vessel or unexpected calcification. Skull: Normal. Negative for fracture or focal lesion. Sinuses/Orbits: No acute finding. Other: None. IMPRESSION: Generalized cerebral atrophy without evidence of acute intracranial pathology. Electronically Signed   By: Virgina Norfolk M.D.   On: 03/12/2020 18:48   MR BRAIN WO CONTRAST  Result Date: 03/12/2020 CLINICAL DATA:  Transient ischemic attack EXAM: MRI HEAD WITHOUT CONTRAST TECHNIQUE: Multiplanar, multiecho pulse sequences of the brain and surrounding structures were obtained without intravenous contrast. COMPARISON:  None. FINDINGS: Brain: No acute infarct, mass effect or extra-axial collection. No acute or chronic hemorrhage. Normal white matter signal, parenchymal volume and CSF spaces. The midline structures are normal. Vascular: Major flow voids are preserved. Skull and upper cervical spine: Normal calvarium and skull base. Visualized upper cervical spine and soft tissues are normal. Sinuses/Orbits:No paranasal sinus fluid levels or advanced mucosal thickening. No mastoid or middle ear effusion. Normal orbits. IMPRESSION: Normal brain MRI. Electronically Signed   By: Ulyses Jarred M.D.   On: 03/12/2020 21:19   DG CHEST PORT 1 VIEW  Result Date: 03/12/2020 CLINICAL DATA:  Dyspnea.  Headache and hypertension. EXAM: PORTABLE CHEST 1 VIEW COMPARISON:  06/10/2018 FINDINGS:  Stable upper normal heart size. Normal mediastinal contours. Mild aortic atherosclerosis. No pulmonary edema, focal airspace disease, pleural effusion or pneumothorax. No acute osseous abnormalities are seen. IMPRESSION: No acute chest findings. Electronically Signed   By: Keith Rake M.D.   On: 03/12/2020 23:10      Subjective: Patient was seen and examined at bedside.  Overnight events noted.   Patient seems much improved,  Patient feels better and want to be discharged. She denies any dizziness, palpitation, chest pain.  She has ambulated in the hallway.  Discharge Exam: Vitals:   03/13/20 2043 03/14/20 0537  BP: (!) 156/76 (!) 180/82  Pulse: 69 67  Resp: 14 20  Temp: 98.9 F (37.2 C) 98.7 F (37.1 C)  SpO2: 98% 96%   Vitals:   03/13/20 1535 03/13/20 1638 03/13/20 2043 03/14/20 0537  BP:  129/83 (!) 156/76 (!) 180/82  Pulse:  67 69 67  Resp: 18 16 14 20   Temp:  97.7 F (36.5 C) 98.9 F (37.2 C) 98.7 F (37.1 C)  TempSrc:  Oral Oral  Axillary  SpO2:  96% 98% 96%  Weight:      Height:        General: Pt is alert, awake, not in acute distress Cardiovascular: RRR, S1/S2 +, no rubs, no gallops Respiratory: CTA bilaterally, no wheezing, no rhonchi Abdominal: Soft, NT, ND, bowel sounds + Extremities: no edema, no cyanosis    The results of significant diagnostics from this hospitalization (including imaging, microbiology, ancillary and laboratory) are listed below for reference.     Microbiology: Recent Results (from the past 240 hour(s))  Resp Panel by RT-PCR (Flu A&B, Covid) Nasopharyngeal Swab     Status: None   Collection Time: 03/12/20  8:18 PM   Specimen: Nasopharyngeal Swab; Nasopharyngeal(NP) swabs in vial transport medium  Result Value Ref Range Status   SARS Coronavirus 2 by RT PCR NEGATIVE NEGATIVE Final    Comment: (NOTE) SARS-CoV-2 target nucleic acids are NOT DETECTED.  The SARS-CoV-2 RNA is generally detectable in upper respiratory specimens  during the acute phase of infection. The lowest concentration of SARS-CoV-2 viral copies this assay can detect is 138 copies/mL. A negative result does not preclude SARS-Cov-2 infection and should not be used as the sole basis for treatment or other patient management decisions. A negative result may occur with  improper specimen collection/handling, submission of specimen other than nasopharyngeal swab, presence of viral mutation(s) within the areas targeted by this assay, and inadequate number of viral copies(<138 copies/mL). A negative result must be combined with clinical observations, patient history, and epidemiological information. The expected result is Negative.  Fact Sheet for Patients:  EntrepreneurPulse.com.au  Fact Sheet for Healthcare Providers:  IncredibleEmployment.be  This test is no t yet approved or cleared by the [place] FDA and  has been authorized for detection and/or diagnosis of SARS-CoV-2 by FDA under an Emergency Use Authorization (EUA). This EUA will remain  in effect (meaning this test can be used) for the duration of the COVID-19 declaration under Section 564(b)(1) of the Act, 21 U.S.C.section 360bbb-3(b)(1), unless the authorization is terminated  or revoked sooner.       Influenza A by PCR NEGATIVE NEGATIVE Final   Influenza B by PCR NEGATIVE NEGATIVE Final    Comment: (NOTE) The Xpert Xpress SARS-CoV-2/FLU/RSV plus assay is intended as an aid in the diagnosis of influenza from Nasopharyngeal swab specimens and should not be used as a sole basis for treatment. Nasal washings and aspirates are unacceptable for Xpert Xpress SARS-CoV-2/FLU/RSV testing.  Fact Sheet for Patients: EntrepreneurPulse.com.au  Fact Sheet for Healthcare Providers: IncredibleEmployment.be  This test is not yet approved or cleared by the [place] FDA and has been authorized for detection  and/or diagnosis of SARS-CoV-2 by FDA under an Emergency Use Authorization (EUA). This EUA will remain in effect (meaning this test can be used) for the duration of the COVID-19 declaration under Section 564(b)(1) of the Act, 21 U.S.C. section 360bbb-3(b)(1), unless the authorization is terminated or revoked.  Performed at [facility], [place] [address] [place], [place] [address]      Labs: BNP (last 3 results) No results for input(s): BNP in the last 8760 hours. Basic Metabolic Panel: Recent Labs  Lab 03/12/20 1741 03/13/20 0515  NA 136 138  K 3.8 3.6  CL 99 98  CO2 28 31  GLUCOSE 111* 102*  BUN 9 11  CREATININE 0.73 0.81  CALCIUM 9.3 9.7   Liver Function Tests: Recent Labs  Lab 03/12/20 1741  AST 16  ALT 23  ALKPHOS  50  BILITOT 0.9  PROT 6.2*  ALBUMIN 3.6   No results for input(s): LIPASE, AMYLASE in the last 168 hours. No results for input(s): AMMONIA in the last 168 hours. CBC: Recent Labs  Lab 03/12/20 1741 03/13/20 0515  WBC 8.6 5.7  NEUTROABS 5.6  --   HGB 14.0 14.8  HCT 42.4 43.5  MCV 103.4* 101.4*  PLT 185 185   Cardiac Enzymes: No results for input(s): CKTOTAL, CKMB, CKMBINDEX, TROPONINI in the last 168 hours. BNP: Invalid input(s): POCBNP CBG: No results for input(s): GLUCAP in the last 168 hours. D-Dimer No results for input(s): DDIMER in the last 72 hours. Hgb A1c No results for input(s): HGBA1C in the last 72 hours. Lipid Profile No results for input(s): CHOL, HDL, LDLCALC, TRIG, CHOLHDL, LDLDIRECT in the last 72 hours. Thyroid function studies No results for input(s): TSH, T4TOTAL, T3FREE, THYROIDAB in the last 72 hours.  Invalid input(s): FREET3 Anemia work up No results for input(s): VITAMINB12, FOLATE, FERRITIN, TIBC, IRON, RETICCTPCT in the last 72 hours. Urinalysis    Component Value Date/Time   COLORURINE YELLOW 03/12/2020 1809   APPEARANCEUR CLEAR 03/12/2020 1809   LABSPEC 1.014 03/12/2020 1809   PHURINE 5.0  03/12/2020 1809   GLUCOSEU NEGATIVE 03/12/2020 1809   HGBUR SMALL (A) 03/12/2020 1809   BILIRUBINUR NEGATIVE 03/12/2020 1809   KETONESUR NEGATIVE 03/12/2020 1809   PROTEINUR NEGATIVE 03/12/2020 1809   UROBILINOGEN 0.2 01/18/2014 0644   NITRITE NEGATIVE 03/12/2020 1809   LEUKOCYTESUR NEGATIVE 03/12/2020 1809   Sepsis Labs Invalid input(s): PROCALCITONIN,  WBC,  LACTICIDVEN Microbiology Recent Results (from the past 240 hour(s))  Resp Panel by RT-PCR (Flu A&B, Covid) Nasopharyngeal Swab     Status: None   Collection Time: 03/12/20  8:18 PM   Specimen: Nasopharyngeal Swab; Nasopharyngeal(NP) swabs in vial transport medium  Result Value Ref Range Status   SARS Coronavirus 2 by RT PCR NEGATIVE NEGATIVE Final    Comment: (NOTE) SARS-CoV-2 target nucleic acids are NOT DETECTED.  The SARS-CoV-2 RNA is generally detectable in upper respiratory specimens during the acute phase of infection. The lowest concentration of SARS-CoV-2 viral copies this assay can detect is 138 copies/mL. A negative result does not preclude SARS-Cov-2 infection and should not be used as the sole basis for treatment or other patient management decisions. A negative result may occur with  improper specimen collection/handling, submission of specimen other than nasopharyngeal swab, presence of viral mutation(s) within the areas targeted by this assay, and inadequate number of viral copies(<138 copies/mL). A negative result must be combined with clinical observations, patient history, and epidemiological information. The expected result is Negative.  Fact Sheet for Patients:  EntrepreneurPulse.com.au  Fact Sheet for Healthcare Providers:  IncredibleEmployment.be  This test is no t yet approved or cleared by the [place] FDA and  has been authorized for detection and/or diagnosis of SARS-CoV-2 by FDA under an Emergency Use Authorization (EUA). This EUA will remain  in  effect (meaning this test can be used) for the duration of the COVID-19 declaration under Section 564(b)(1) of the Act, 21 U.S.C.section 360bbb-3(b)(1), unless the authorization is terminated  or revoked sooner.       Influenza A by PCR NEGATIVE NEGATIVE Final   Influenza B by PCR NEGATIVE NEGATIVE Final    Comment: (NOTE) The Xpert Xpress SARS-CoV-2/FLU/RSV plus assay is intended as an aid in the diagnosis of influenza from Nasopharyngeal swab specimens and should not be used as a sole basis for treatment. Nasal washings and  aspirates are unacceptable for Xpert Xpress SARS-CoV-2/FLU/RSV testing.  Fact Sheet for Patients: EntrepreneurPulse.com.au  Fact Sheet for Healthcare Providers: IncredibleEmployment.be  This test is not yet approved or cleared by the [place] FDA and has been authorized for detection and/or diagnosis of SARS-CoV-2 by FDA under an Emergency Use Authorization (EUA). This EUA will remain in effect (meaning this test can be used) for the duration of the COVID-19 declaration under Section 564(b)(1) of the Act, 21 U.S.C. section 360bbb-3(b)(1), unless the authorization is terminated or revoked.  Performed at [facility], [place] [address], [place], [place] [address]      Time coordinating discharge: Over 30 minutes  SIGNED:   Shawna Clamp, MD  [facility] 03/14/2020, 10:43 AM Pager   If 7PM-7AM, please contact night-coverage [email or web address]

## 2020-03-14 NOTE — Plan of Care (Signed)
Patient is currently resting in bed. Denies pain. Headache resolved. VSS. Oob ambulating in room. No c/o throughout the night. Call bell within reach.   Problem: Education: Goal: Knowledge of General Education information will improve Description: Including pain rating scale, medication(s)/side effects and non-pharmacologic comfort measures Outcome: Progressing   Problem: Health Behavior/Discharge Planning: Goal: Ability to manage health-related needs will improve Outcome: Progressing   Problem: Clinical Measurements: Goal: Ability to maintain clinical measurements within normal limits will improve Outcome: Progressing Goal: Will remain free from infection Outcome: Progressing Goal: Diagnostic test results will improve Outcome: Progressing Goal: Respiratory complications will improve Outcome: Progressing Goal: Cardiovascular complication will be avoided Outcome: Progressing   Problem: Activity: Goal: Risk for activity intolerance will decrease Outcome: Progressing   Problem: Nutrition: Goal: Adequate nutrition will be maintained Outcome: Progressing   Problem: Coping: Goal: Level of anxiety will decrease Outcome: Progressing   Problem: Elimination: Goal: Will not experience complications related to bowel motility Outcome: Progressing Goal: Will not experience complications related to urinary retention Outcome: Progressing   Problem: Pain Managment: Goal: General experience of comfort will improve Outcome: Progressing   Problem: Safety: Goal: Ability to remain free from injury will improve Outcome: Progressing   Problem: Skin Integrity: Goal: Risk for impaired skin integrity will decrease Outcome: Progressing

## 2020-03-14 NOTE — Plan of Care (Signed)

## 2020-03-14 NOTE — Discharge Instructions (Signed)
Advised to follow-up with primary care physician in 1 week. Advised to take Avapro 150 mg daily and amlodipine 5 mg daily for blood pressure control. Advised to continue nebivolol as prescribed. Advised to check blood pressure and follow-up with primary care physician. We will request primary care physician to schedule her for outpatient echocardiogram.

## 2020-03-14 NOTE — Progress Notes (Signed)
Patient discharged from unit at this time via w/c. All discharge instructions reviewed with patient and patients son at bedside. All questions answered appropriately. IV site removed, catheter intact, pressure dressing applied. All patient belongings in disposition of the patient. No further needs at this time.

## 2021-11-15 IMAGING — CT CT HEAD W/O CM
3 series · 15 of 45 positions shown, 18 images · non-contrast
Comparison: June 10, 2018

CLINICAL DATA: Headache.

EXAM:
CT HEAD WITHOUT CONTRAST
TECHNIQUE: Contiguous axial images were obtained from the base of the skull
through the vertex without intravenous contrast.

[Series 3: head 5.0 h30s · axial · 0.41mm/px · z∈[-84,+31]mm · 9 of 28 slices shown, 12 images]
[im 3/28  brain]
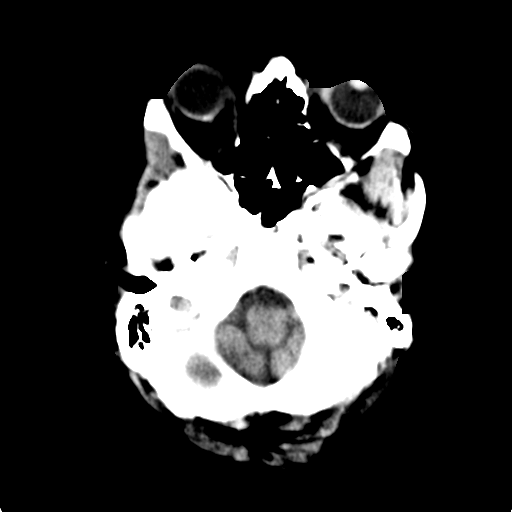
[im 3/28  bone]
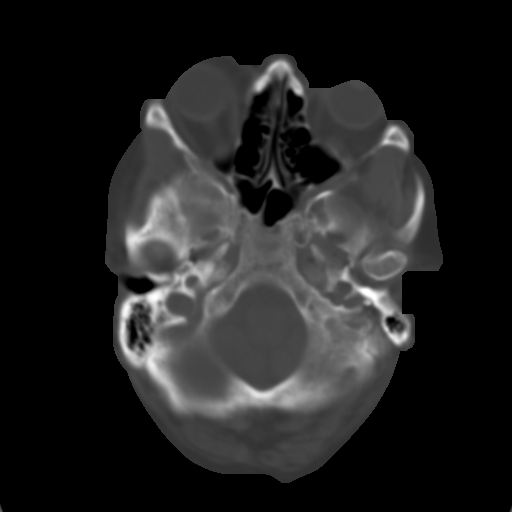
[im 6/28  brain]
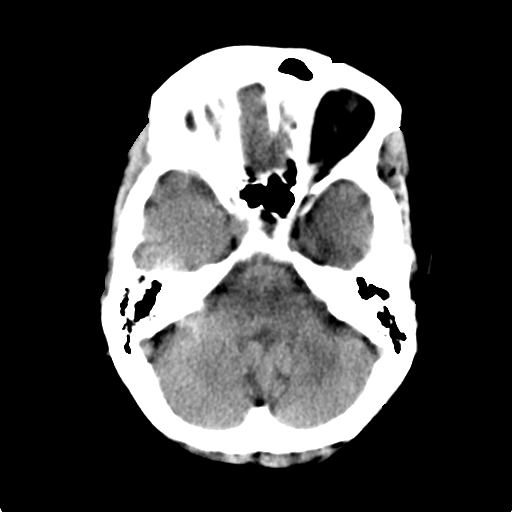
[im 9/28  brain]
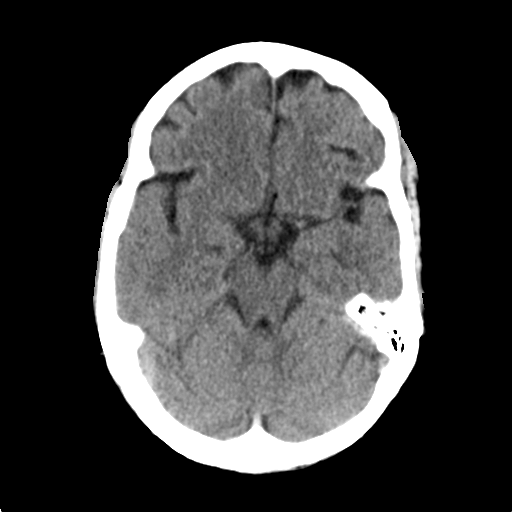
[im 12/28  brain]
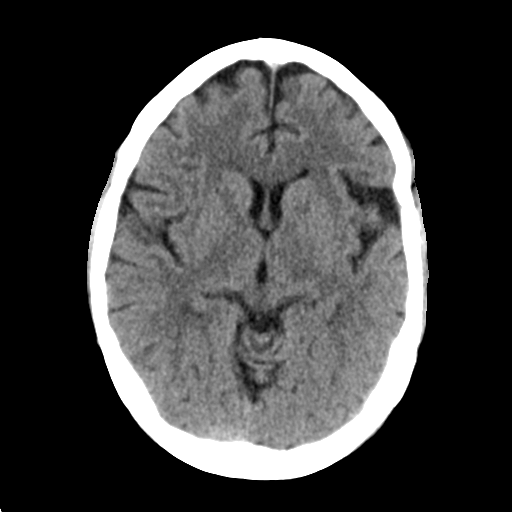
[im 15/28  brain]
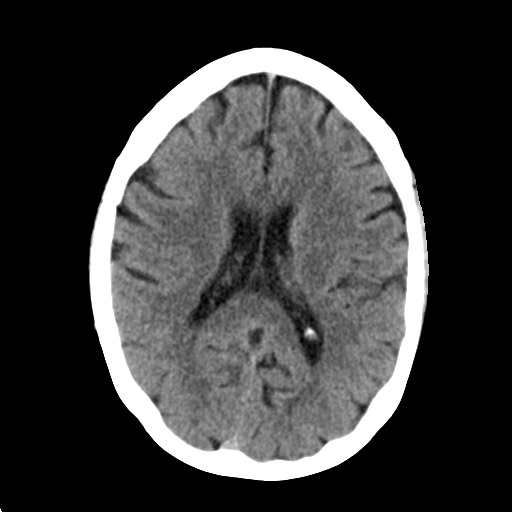
[im 15/28  bone]
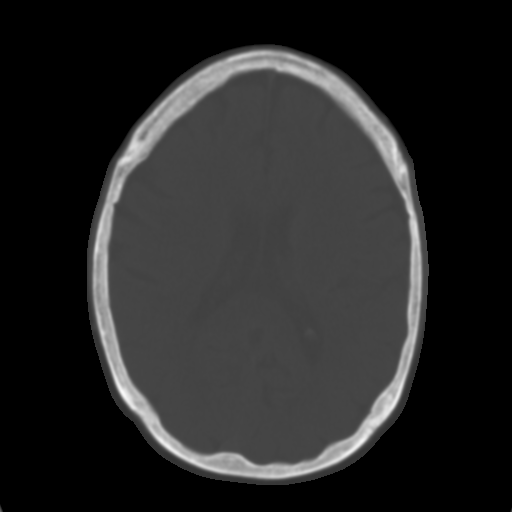
[im 17/28  brain]
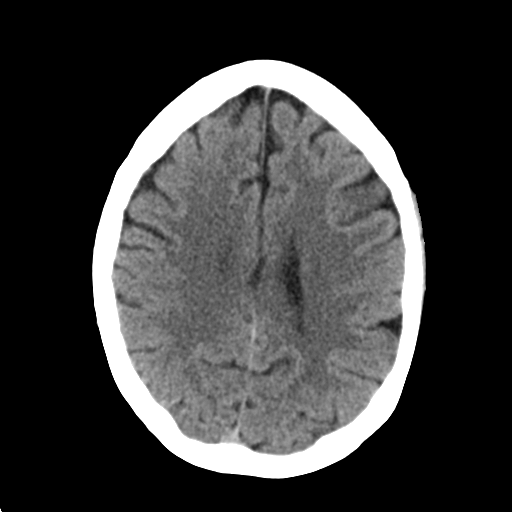
[im 20/28  brain]
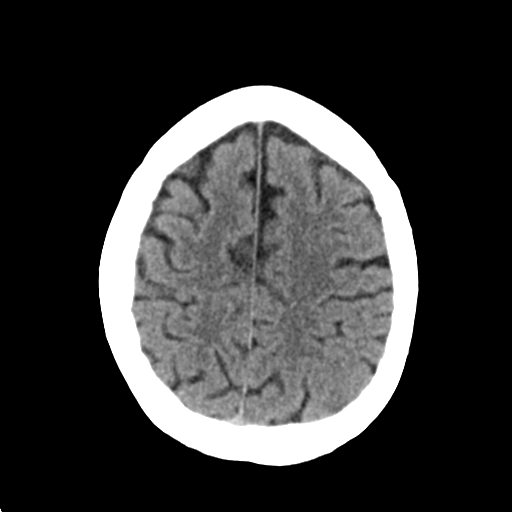
[im 23/28  brain]
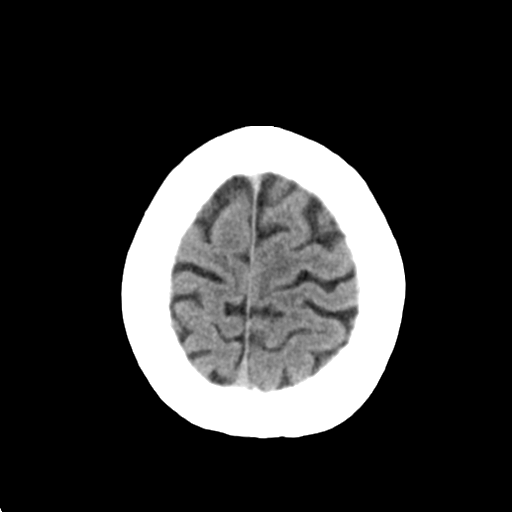
[im 26/28  brain]
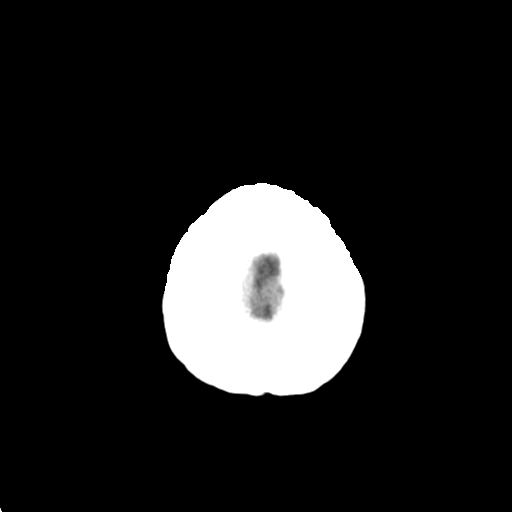
[im 26/28  bone]
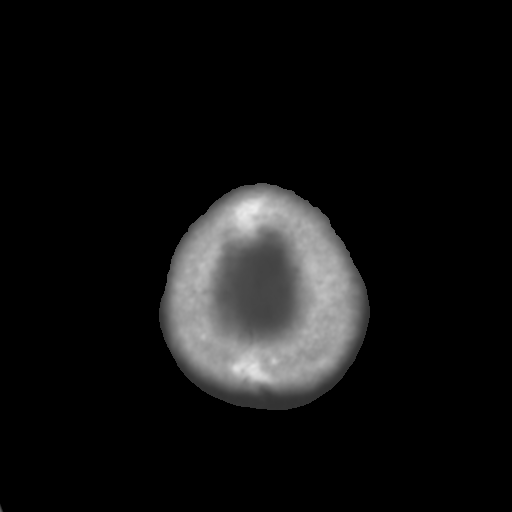

[Series 5: head 3.0 mpr cor · coronal · 0.28mm/px · 3 of 67 slices shown]
[im 23/67  brain]
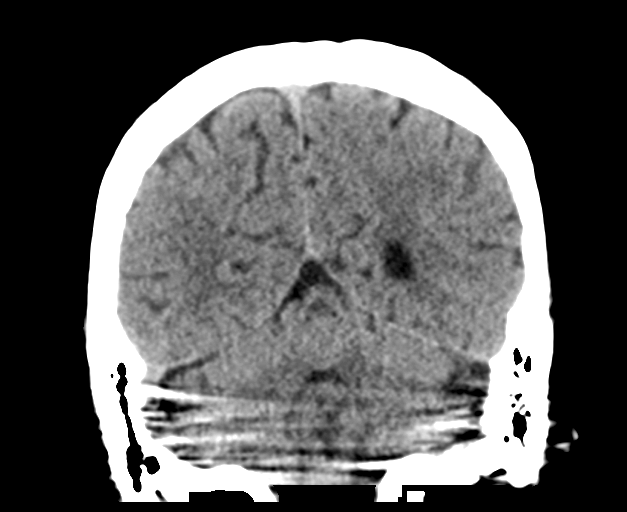
[im 30/67  brain]
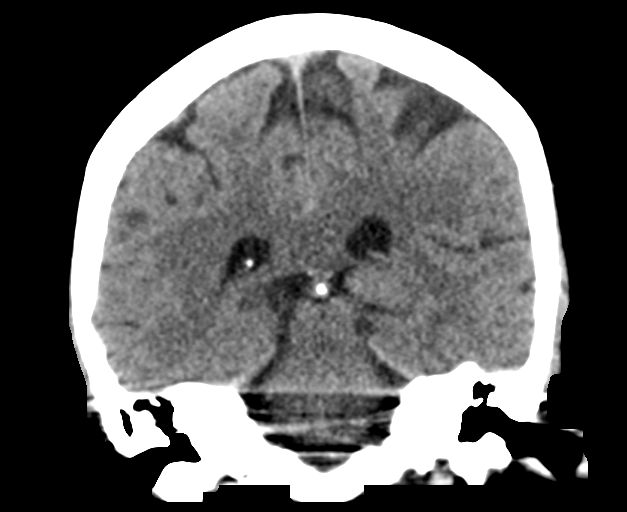
[im 37/67  brain]
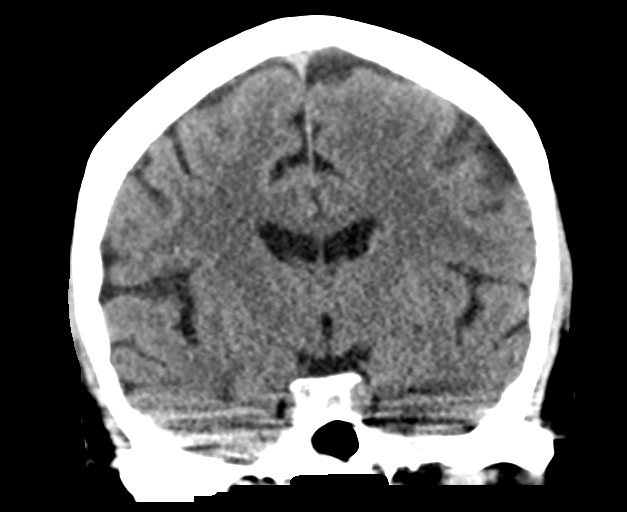

[Series 6: head 3.0 mpr sag · sagittal · 0.28mm/px · 3 of 67 slices shown]
[im 23/67  brain]
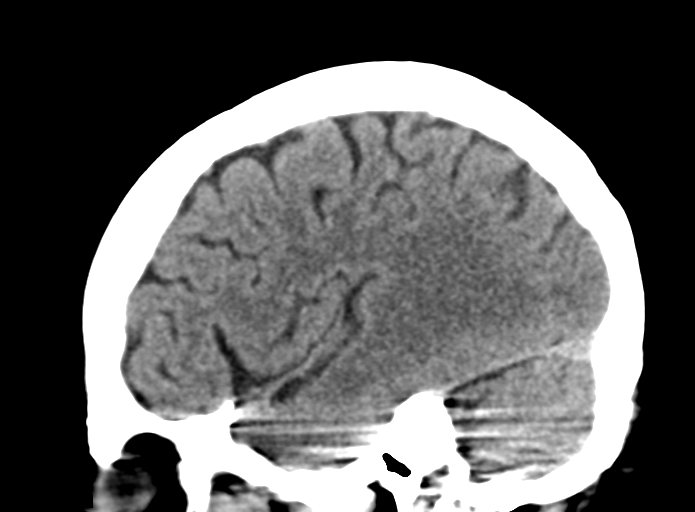
[im 34/67  brain]
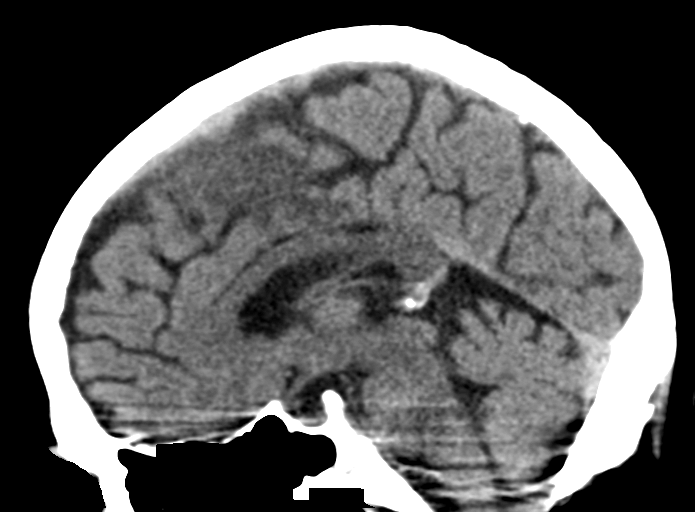
[im 45/67  brain]
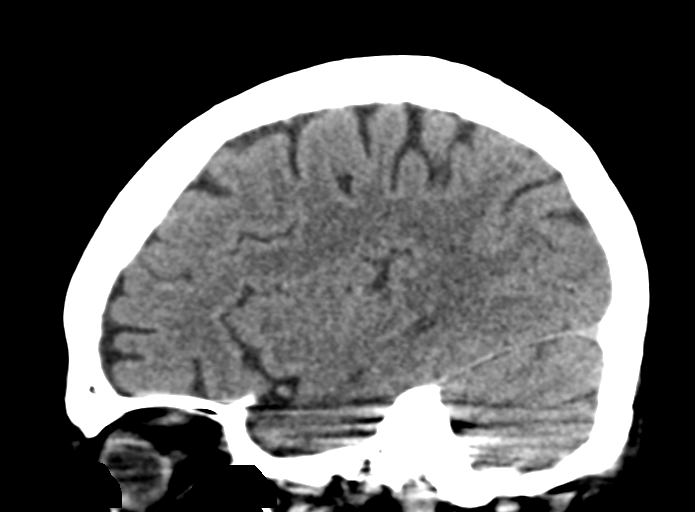

[15 of 45 positions shown; findings below may reference images not displayed]

FINDINGS: Brain: There is mild cerebral atrophy with widening of the
extra-axial spaces and ventricular dilatation.
There are areas of decreased attenuation within the white matter
tracts of the supratentorial brain, consistent with microvascular
disease changes.

Vascular: No hyperdense vessel or unexpected calcification.

Skull: Normal. Negative for fracture or focal lesion.

Sinuses/Orbits: No acute finding.

Other: None.
IMPRESSION: Generalized cerebral atrophy without evidence of acute intracranial
pathology.

## 2021-11-28 ENCOUNTER — Other Ambulatory Visit: Payer: Self-pay | Admitting: Obstetrics and Gynecology

## 2021-11-28 DIAGNOSIS — R928 Other abnormal and inconclusive findings on diagnostic imaging of breast: Secondary | ICD-10-CM

## 2021-12-03 ENCOUNTER — Other Ambulatory Visit: Payer: Self-pay | Admitting: Obstetrics and Gynecology

## 2021-12-03 ENCOUNTER — Ambulatory Visit
Admission: RE | Admit: 2021-12-03 | Discharge: 2021-12-03 | Disposition: A | Discharge status: Home / Self Care | Payer: Medicare HMO | Source: Ambulatory Visit | Attending: Obstetrics and Gynecology | Admitting: Obstetrics and Gynecology

## 2021-12-03 DIAGNOSIS — N6310 Unspecified lump in the right breast, unspecified quadrant: Secondary | ICD-10-CM

## 2021-12-03 DIAGNOSIS — R928 Other abnormal and inconclusive findings on diagnostic imaging of breast: Secondary | ICD-10-CM

## 2021-12-13 ENCOUNTER — Ambulatory Visit
Admission: RE | Admit: 2021-12-13 | Discharge: 2021-12-13 | Disposition: A | Discharge status: Home / Self Care | Payer: Medicare HMO | Source: Ambulatory Visit | Attending: Obstetrics and Gynecology | Admitting: Obstetrics and Gynecology

## 2021-12-13 DIAGNOSIS — R928 Other abnormal and inconclusive findings on diagnostic imaging of breast: Secondary | ICD-10-CM

## 2021-12-13 DIAGNOSIS — N6310 Unspecified lump in the right breast, unspecified quadrant: Secondary | ICD-10-CM

## 2021-12-13 HISTORY — PX: BREAST BIOPSY: SHX20

## 2021-12-16 ENCOUNTER — Telehealth: Payer: Self-pay | Admitting: Hematology and Oncology

## 2021-12-16 NOTE — Telephone Encounter (Signed)
Spoke to patient to confirm upcoming morning [facility] clinic appointment on 12/20, paperwork will be sent via e-mail.   Gave location and time, also informed patient that the surgeon's office would be calling as well to get information from them similar to the packet that they will be receiving so make sure to do both.  Reminded patient that all providers will be coming to the clinic to see them HERE and if they had any questions to not hesitate to reach back out to myself or their navigators.

## 2021-12-19 ENCOUNTER — Encounter: Payer: Self-pay | Admitting: *Deleted

## 2021-12-19 DIAGNOSIS — Z170 Estrogen receptor positive status [ER+]: Secondary | ICD-10-CM

## 2021-12-19 NOTE — Progress Notes (Signed)
Radiation Oncology         [phone] ________________________________  Name: Cynthia Mccullough        MRN: [id number]  Date of Service: 12/21/2021 DOB: Dec 27, 1950  EY:[id number], Rebeca Alert, MD  Coralie Keens, MD     REFERRING PHYSICIAN: Coralie Keens, MD   DIAGNOSIS: There were no encounter diagnoses.   HISTORY OF PRESENT ILLNESS: Cynthia Mccullough is a 71 y.o. female seen in the multidisciplinary breast clinic for a new diagnosis of right breast cancer. The patient was noted to have a screening detected distortion in the right breast.  She underwent further diagnostic workup which identified a mass in the right breast in the upper outer quadrant.  This was located by ultrasound in the 10 o'clock position and measured 5 mm in greatest dimension.  Her axilla was negative for adenopathy.  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  Her cancer was ER/PR positive, HER2 was equivocal by IHC and reflex to a***, and Ki-67 was 3%. Marland Kitchen    PREVIOUS RADIATION THERAPY: {EXAM; YES/NO:19492::"No"}   PAST MEDICAL HISTORY:  Past Medical History:  Diagnosis Date   Allergic rhinitis    Anxiety    Asthma    Depression    Essential tremor    HLD (hyperlipidemia)    HTN (hypertension)    Kidney stones    Panic attacks        PAST SURGICAL HISTORY: Past Surgical History:  Procedure Laterality Date   BREAST BIOPSY Right 12/13/2021   [place] RT BREAST BX W LOC DEV 1ST LESION IMG BX SPEC US GUIDE 12/13/2021 [facility]AMMOGRAP[facility]   CERVICAL ABLATION     DILATION AND CURETTAGE OF UTERUS     EXCISION MORTON'S NEUROMA Right 2008   FOOT SURGERY     LAPAROSCOPIC ASSISTED VAGINAL HYSTERECTOMY N/A 04/23/2013   Procedure: LAPAROSCOPIC ASSISTED VAGINAL HYSTERECTOMY;  Surgeon: Cyril Mourning, MD;  Location: [facility];  Service: Gynecology;  Laterality: N/A;   LIPOSUCTION     SALPINGOOPHORECTOMY Bilateral 04/23/2013    Procedure: SALPINGO OOPHORECTOMY;  Surgeon: Cyril Mourning, MD;  Location: [facility];  Service: Gynecology;  Laterality: Bilateral;     FAMILY HISTORY:  Family History  Problem Relation Age of Onset   Heart attack Father    Coronary artery disease Father    Hypertension Father    Diabetes Father    Cancer - Prostate Father    Cancer - Other Maternal Grandmother        breast and bone   Cancer - Other Maternal Grandfather        liver     SOCIAL HISTORY:  reports that she has been smoking cigarettes. She has never used smokeless tobacco. She reports current alcohol use of about 2.0 standard drinks of alcohol per week. She reports that she does not use drugs. The patient is widowed and lives in [place]. She ***   ALLERGIES: Amoxicillin, Metronidazole, Penicillins, Sulfonamide derivatives, Cheese, Atorvastatin, and Rosuvastatin   MEDICATIONS:  Current Outpatient Medications  Medication Sig Dispense Refill   albuterol (PROVENTIL HFA;VENTOLIN HFA) 108 (90 Base) MCG/ACT inhaler Inhale 2 puffs into the lungs every 4 (four) hours as needed for wheezing or shortness of breath. 1 Inhaler 2   allopurinol (ZYLOPRIM) 100 MG tablet Take 100 mg by mouth 3 (three) times daily.     ALPRAZolam (XANAX) 0.5 MG tablet Take 0.5-0.75 mg by mouth at bedtime.  amLODipine (NORVASC) 5 MG tablet Take 1 tablet (5 mg total) by mouth daily. 30 tablet 1   BEELITH 362-20 MG TABS tablet Take 1 tablet by mouth daily.     budesonide (ENTOCORT EC) 3 MG 24 hr capsule Take 9 mg by mouth daily.     buPROPion (WELLBUTRIN SR) 150 MG 12 hr tablet Take 150 mg by mouth 2 (two) times daily.     cetirizine (ZYRTEC) 10 MG tablet Take 10 mg by mouth at bedtime.      colestipol (COLESTID) 1 g tablet Take 1 tablet by mouth daily.     cyanocobalamin (,VITAMIN B-12,) 1000 MCG/ML injection Inject 1 mL into the muscle every 30 (thirty) days.     fenofibrate 160 MG tablet Take 160 mg by mouth at bedtime.      furosemide (LASIX)  20 MG tablet Take 1 tablet (20 mg total) by mouth daily. 30 tablet 0   irbesartan (AVAPRO) 150 MG tablet Take 1 tablet (150 mg total) by mouth daily. 30 tablet 1   latanoprost (XALATAN) 0.005 % ophthalmic solution Place 1 drop into both eyes at bedtime.     levothyroxine (SYNTHROID, LEVOTHROID) 50 MCG tablet Take 50 mcg by mouth daily before breakfast.  0   nebivolol (BYSTOLIC) 10 MG tablet Take 10 mg by mouth at bedtime.     pantoprazole (PROTONIX) 40 MG tablet Take 40 mg by mouth daily.     Polyvinyl Alcohol-Povidone (REFRESH OP) Place 1 drop into both eyes daily as needed (dry eyes).      potassium chloride (K-DUR) 10 MEQ tablet Take 1 tablet (10 mEq total) by mouth 2 (two) times daily. 30 tablet 30   traZODone (DESYREL) 100 MG tablet Take 1 tablet (100 mg total) by mouth at bedtime. 30 tablet 0   Vilazodone HCl (VIIBRYD) 40 MG TABS Take 40 mg by mouth daily.      Vitamin D, Ergocalciferol, (DRISDOL) 1.25 MG (50000 UT) CAPS capsule Take 1 capsule by mouth once a week.     zaleplon (SONATA) 10 MG capsule Take 2 capsules by mouth at bedtime.     No current facility-administered medications for this visit.     REVIEW OF SYSTEMS: On review of systems, the patient reports that she is doing ***     PHYSICAL EXAM:  Wt Readings from Last 3 Encounters:  03/13/20 189 lb (85.7 kg)  06/10/18 183 lb (83 kg)  12/09/16 210 lb (95.3 kg)   Temp Readings from Last 3 Encounters:  03/14/20 98.8 F (37.1 C) (Oral)  06/10/18 97.8 F (36.6 C) (Oral)  12/09/16 99.2 F (37.3 C) (Oral)   BP Readings from Last 3 Encounters:  03/14/20 (!) 148/80  06/10/18 117/66  12/09/16 (!) 142/93   Pulse Readings from Last 3 Encounters:  03/14/20 67  06/10/18 73  12/09/16 66    In general this is a well appearing *** female in no acute distress. She's alert and oriented x4 and appropriate throughout the examination. Cardiopulmonary assessment is negative for acute distress and she exhibits normal effort.  Bilateral breast exam is deferred.    ECOG = ***  0 - Asymptomatic (Fully active, able to carry on all predisease activities without restriction)  1 - Symptomatic but completely ambulatory (Restricted in physically strenuous activity but ambulatory and able to carry out work of a light or sedentary nature. For example, light housework, office work)  2 - Symptomatic, <50% in bed during the day (Ambulatory and capable of all self  care but unable to carry out any work activities. Up and about more than 50% of waking hours)  3 - Symptomatic, >50% in bed, but not bedbound (Capable of only limited self-care, confined to bed or chair 50% or more of waking hours)  4 - Bedbound (Completely disabled. Cannot carry on any self-care. Totally confined to bed or chair)  5 - Death   Eustace Pen MM, Creech RH, Tormey DC, et al. [phone]). "Toxicity and response criteria of the [facility]". [place] Oncol. 5 (6): 649-55    LABORATORY DATA:  Lab Results  Component Value Date   WBC 5.7 03/13/2020   HGB 14.8 03/13/2020   HCT 43.5 03/13/2020   MCV 101.4 (H) 03/13/2020   PLT 185 03/13/2020   Lab Results  Component Value Date   NA 138 03/13/2020   K 3.6 03/13/2020   CL 98 03/13/2020   CO2 31 03/13/2020   Lab Results  Component Value Date   ALT 23 03/12/2020   AST 16 03/12/2020   ALKPHOS 50 03/12/2020   BILITOT 0.9 03/12/2020      RADIOGRAPHY: [place] RT BREAST BX W LOC DEV 1ST LESION IMG BX SPEC US GUIDE  Addendum Date: 12/15/2021   ADDENDUM REPORT: 12/15/2021 13:36 ADDENDUM: PATHOLOGY ADDENDUM: Pathology RIGHT breast needle core biopsy 10 o'clock mass, ribbon clip: - INVASIVE LOBULAR CARCINOMA, GRADE 2 (GLANDULAR DIFFERENTIATION, SCORE 3; NUCLEAR PLEOMORPHISM, SCORE 2; MITOTIC RATE, SCORE 1), LONGEST INVOLVED SEGMENT WITH TUMOR 4 MM - LOBULAR CARCINOMA IN SITU, GRADE II. Pathology concordance with imaging findings: Yes Recommendation: Consultation for treatment plan At the  request of the patient, I spoke with her by telephone on 12/14/2021 and 12/15/2021 she reports doing well after the biopsy . The patient is scheduled for Multidisciplinary Clinic on 12/21/2021. Electronically Signed   By: Nolon Nations M.D.   On: 12/15/2021 13:36   Result Date: 12/15/2021 CLINICAL DATA:  Patient presents for ultrasound-guided core biopsy of RIGHT breast mass. EXAM: ULTRASOUND GUIDED RIGHT BREAST CORE NEEDLE BIOPSY COMPARISON:  Previous exam(s). PROCEDURE: I met with the patient and we discussed the procedure of ultrasound-guided biopsy, including benefits and alternatives. We discussed the high likelihood of a successful procedure. We discussed the risks of the procedure, including infection, bleeding, tissue injury, clip migration, and inadequate sampling. Informed written consent was given. The usual time-out protocol was performed immediately prior to the procedure. Lesion quadrant: UPPER-OUTER QUADRANT RIGHT breast Using sterile technique and 1% Lidocaine as local anesthetic, under direct ultrasound visualization, a 12 gauge spring-loaded device was used to perform biopsy of mass in the 10 o'clock location of the RIGHT breast using a inferior to superior approach. At the conclusion of the procedure ribbon shaped tissue marker clip was deployed into the biopsy cavity. Follow up 2 view mammogram was performed and dictated separately. IMPRESSION: Ultrasound guided biopsy of RIGHT breast mass. No apparent complications. Electronically Signed: By: Nolon Nations M.D. On: 12/13/2021 13:55  MM CLIP PLACEMENT RIGHT  Result Date: 12/13/2021 CLINICAL DATA:  Status post ultrasound-guided core biopsy of RIGHT breast mass. EXAM: 3D DIAGNOSTIC RIGHT MAMMOGRAM POST ULTRASOUND BIOPSY COMPARISON:  Previous exam(s). FINDINGS: 3D Mammographic images were obtained following ultrasound guided biopsy of mass in the 10 o'clock location of the RIGHT breast and placement of a ribbon shaped. The biopsy  marking clip is in expected position at the site of biopsy. IMPRESSION: Appropriate positioning of the ribbon shaped biopsy marking clip at the site of biopsy in the UPPER-OUTER QUADRANT RIGHT  breast. Final Assessment: Post Procedure Mammograms for Marker Placement Electronically Signed   By: Nolon Nations M.D.   On: 12/13/2021 14:22  MM DIAG BREAST TOMO UNI RIGHT  Result Date: 12/03/2021 CLINICAL DATA:  Screening recall for possible right breast mass. EXAM: DIGITAL DIAGNOSTIC UNILATERAL RIGHT MAMMOGRAM WITH TOMOSYNTHESIS; ULTRASOUND RIGHT BREAST LIMITED TECHNIQUE: Right digital diagnostic mammography and breast tomosynthesis was performed.; Targeted ultrasound examination of the right breast was performed COMPARISON:  Previous exam(s). ACR Breast Density Category b: There are scattered areas of fibroglandular density. FINDINGS: Spot compression tomograms were performed over the upper outer right breast demonstrating a persistent area of distortion with a probable associated small mass. Targeted ultrasound of upper-outer right breast was performed. There is an irregular hypoechoic mass in the right breast at 10 o'clock 5 cm from nipple measuring 0.5 x 0.3 x 0.4 cm. This corresponds well with the distortion/mass seen in the upper-outer right breast at mammography. No lymphadenopathy seen in the right axilla. IMPRESSION: Suspicious 0.5 cm mass in the right breast at the 10 o'clock position. RECOMMENDATION: Recommend ultrasound-guided core biopsy of the mass in the right breast at the 10 o'clock position. I have discussed the findings and recommendations with the patient. If applicable, a reminder letter will be sent to the patient regarding the next appointment. BI-RADS CATEGORY  4: Suspicious. Electronically Signed   By: Everlean Alstrom M.D.   On: 12/03/2021 10:35  US BREAST LTD UNI RIGHT INC AXILLA  Result Date: 12/03/2021 CLINICAL DATA:  Screening recall for possible right breast mass. EXAM: DIGITAL  DIAGNOSTIC UNILATERAL RIGHT MAMMOGRAM WITH TOMOSYNTHESIS; ULTRASOUND RIGHT BREAST LIMITED TECHNIQUE: Right digital diagnostic mammography and breast tomosynthesis was performed.; Targeted ultrasound examination of the right breast was performed COMPARISON:  Previous exam(s). ACR Breast Density Category b: There are scattered areas of fibroglandular density. FINDINGS: Spot compression tomograms were performed over the upper outer right breast demonstrating a persistent area of distortion with a probable associated small mass. Targeted ultrasound of upper-outer right breast was performed. There is an irregular hypoechoic mass in the right breast at 10 o'clock 5 cm from nipple measuring 0.5 x 0.3 x 0.4 cm. This corresponds well with the distortion/mass seen in the upper-outer right breast at mammography. No lymphadenopathy seen in the right axilla. IMPRESSION: Suspicious 0.5 cm mass in the right breast at the 10 o'clock position. RECOMMENDATION: Recommend ultrasound-guided core biopsy of the mass in the right breast at the 10 o'clock position. I have discussed the findings and recommendations with the patient. If applicable, a reminder letter will be sent to the patient regarding the next appointment. BI-RADS CATEGORY  4: Suspicious. Electronically Signed   By: Everlean Alstrom M.D.   On: 12/03/2021 10:35      IMPRESSION/PLAN: 1. Stage IA, [id number], grade 2, ER/PR positive invasive ductal carcinoma of the right breast. Dr. Lisbeth Renshaw discusses the pathology findings and reviews the nature of early stage right breast disease. The consensus from the breast conference includes breast conservation with lumpectomy with  sentinel node biopsy. Depending on the size of the final tumor measurements rendered by pathology, the tumor may be tested for Oncotype Dx score to determine a role for systemic therapy. Provided that chemotherapy is not indicated, the patient's course would then be followed by external radiotherapy to the  breast  to reduce risks of local recurrence followed by antiestrogen therapy. We discussed the risks, benefits, short, and long term effects of radiotherapy, as well as the curative intent, and the patient is interested  in proceeding. Dr. Lisbeth Renshaw discusses the delivery and logistics of radiotherapy and anticipates a course of 4 or up to 6 1/2 weeks of radiotherapy to the right breast. We will see her back a few weeks after surgery to discuss the simulation process and anticipate we starting radiotherapy about 4-6 weeks after surgery.   2. Possible genetic predisposition to malignancy. The patient is a candidate for genetic testing given her personal and family history. She will meet with our geneticist today in clinic.   In a visit lasting *** minutes, greater than 50% of the time was spent face to face reviewing her case, as well as in preparation of, discussing, and coordinating the patient's care.  The above documentation reflects my direct findings during this shared patient visit. Please see the separate note by Dr. Lisbeth Renshaw on this date for the remainder of the patient's plan of care.    Carola Rhine, [facility]    **Disclaimer: This note was dictated with voice recognition software. Similar sounding words can inadvertently be transcribed and this note may contain transcription errors which may not have been corrected upon publication of note.**

## 2021-12-21 ENCOUNTER — Encounter: Payer: Self-pay | Admitting: Radiation Oncology

## 2021-12-21 ENCOUNTER — Encounter: Payer: Self-pay | Admitting: Hematology

## 2021-12-21 ENCOUNTER — Other Ambulatory Visit: Payer: Self-pay | Admitting: Surgery

## 2021-12-21 ENCOUNTER — Encounter: Payer: Self-pay | Admitting: Physical Therapy

## 2021-12-21 ENCOUNTER — Ambulatory Visit: Payer: Medicare HMO | Attending: Surgery | Admitting: Physical Therapy

## 2021-12-21 ENCOUNTER — Inpatient Hospital Stay: Payer: Medicare HMO | Attending: Hematology

## 2021-12-21 ENCOUNTER — Other Ambulatory Visit: Payer: Self-pay | Admitting: Hematology

## 2021-12-21 ENCOUNTER — Inpatient Hospital Stay: Payer: Medicare HMO | Admitting: Licensed Clinical Social Worker

## 2021-12-21 ENCOUNTER — Encounter: Payer: Self-pay | Admitting: Genetic Counselor

## 2021-12-21 ENCOUNTER — Inpatient Hospital Stay: Payer: Medicare HMO

## 2021-12-21 ENCOUNTER — Encounter: Payer: Self-pay | Admitting: *Deleted

## 2021-12-21 ENCOUNTER — Other Ambulatory Visit: Payer: Self-pay

## 2021-12-21 ENCOUNTER — Inpatient Hospital Stay (HOSPITAL_BASED_OUTPATIENT_CLINIC_OR_DEPARTMENT_OTHER): Payer: Medicare HMO | Admitting: Genetic Counselor

## 2021-12-21 ENCOUNTER — Inpatient Hospital Stay (HOSPITAL_BASED_OUTPATIENT_CLINIC_OR_DEPARTMENT_OTHER): Payer: Medicare HMO | Admitting: Hematology

## 2021-12-21 ENCOUNTER — Ambulatory Visit
Admission: RE | Admit: 2021-12-21 | Discharge: 2021-12-21 | Disposition: A | Discharge status: Home / Self Care | Payer: Medicare HMO | Source: Ambulatory Visit | Attending: Radiation Oncology | Admitting: Radiation Oncology

## 2021-12-21 VITALS — BP 187/83 | HR 75 | Temp 97.8°F | Resp 20 | Ht 68.0 in | Wt 182.0 lb

## 2021-12-21 DIAGNOSIS — R293 Abnormal posture: Secondary | ICD-10-CM | POA: Diagnosis present

## 2021-12-21 DIAGNOSIS — Z170 Estrogen receptor positive status [ER+]: Secondary | ICD-10-CM | POA: Insufficient documentation

## 2021-12-21 DIAGNOSIS — Z803 Family history of malignant neoplasm of breast: Secondary | ICD-10-CM | POA: Diagnosis not present

## 2021-12-21 DIAGNOSIS — Z8042 Family history of malignant neoplasm of prostate: Secondary | ICD-10-CM | POA: Diagnosis not present

## 2021-12-21 DIAGNOSIS — Z853 Personal history of malignant neoplasm of breast: Secondary | ICD-10-CM

## 2021-12-21 DIAGNOSIS — F17210 Nicotine dependence, cigarettes, uncomplicated: Secondary | ICD-10-CM | POA: Insufficient documentation

## 2021-12-21 DIAGNOSIS — Z90710 Acquired absence of both cervix and uterus: Secondary | ICD-10-CM | POA: Diagnosis not present

## 2021-12-21 DIAGNOSIS — I10 Essential (primary) hypertension: Secondary | ICD-10-CM | POA: Diagnosis not present

## 2021-12-21 DIAGNOSIS — C50411 Malignant neoplasm of upper-outer quadrant of right female breast: Secondary | ICD-10-CM

## 2021-12-21 DIAGNOSIS — Z79899 Other long term (current) drug therapy: Secondary | ICD-10-CM | POA: Diagnosis not present

## 2021-12-21 DIAGNOSIS — Z9079 Acquired absence of other genital organ(s): Secondary | ICD-10-CM | POA: Insufficient documentation

## 2021-12-21 DIAGNOSIS — Z800 Family history of malignant neoplasm of digestive organs: Secondary | ICD-10-CM | POA: Insufficient documentation

## 2021-12-21 DIAGNOSIS — Z90722 Acquired absence of ovaries, bilateral: Secondary | ICD-10-CM | POA: Insufficient documentation

## 2021-12-21 HISTORY — DX: Other specified personal risk factors, not elsewhere classified: Z91.89

## 2021-12-21 LAB — CMP (CANCER CENTER ONLY)
ALT: 21 U/L (ref 0–44)
AST: 20 U/L (ref 15–41)
Albumin: 3.5 g/dL (ref 3.5–5.0)
Alkaline Phosphatase: 34 U/L — ABNORMAL LOW (ref 38–126)
Anion gap: 8 (ref 5–15)
BUN: 15 mg/dL (ref 8–23)
CO2: 30 mmol/L (ref 22–32)
Calcium: 9.5 mg/dL (ref 8.9–10.3)
Chloride: 103 mmol/L (ref 98–111)
Creatinine: 0.83 mg/dL (ref 0.44–1.00)
GFR, Estimated: >60 mL/min (ref >60)
Glucose, Bld: 101 mg/dL — ABNORMAL HIGH (ref 70–99)
Potassium: 3.8 mmol/L (ref 3.5–5.1)
Sodium: 141 mmol/L (ref 135–145)
Total Bilirubin: 0.7 mg/dL (ref 0.3–1.2)
Total Protein: 6.6 g/dL (ref 6.5–8.1)

## 2021-12-21 LAB — CBC WITH DIFFERENTIAL (CANCER CENTER ONLY)
Abs Immature Granulocytes: 0.04 10*3/uL (ref 0.00–0.07)
Basophils Absolute: 0.1 10*3/uL (ref 0.0–0.1)
Basophils Relative: 1 %
Eosinophils Absolute: 0.2 10*3/uL (ref 0.0–0.5)
Eosinophils Relative: 3 %
HCT: 44.4 % (ref 36.0–46.0)
Hemoglobin: 15.2 g/dL — ABNORMAL HIGH (ref 12.0–15.0)
Immature Granulocytes: 1 %
Lymphocytes Relative: 34 %
Lymphs Abs: 2 10*3/uL (ref 0.7–4.0)
MCH: 34.5 pg — ABNORMAL HIGH (ref 26.0–34.0)
MCHC: 34.2 g/dL (ref 30.0–36.0)
MCV: 100.7 fL — ABNORMAL HIGH (ref 80.0–100.0)
Monocytes Absolute: 0.5 10*3/uL (ref 0.1–1.0)
Monocytes Relative: 9 %
Neutro Abs: 3 10*3/uL (ref 1.7–7.7)
Neutrophils Relative %: 52 %
Platelet Count: 234 10*3/uL (ref 150–400)
RBC: 4.41 MIL/uL (ref 3.87–5.11)
RDW: 12.7 % (ref 11.5–15.5)
WBC Count: 5.7 10*3/uL (ref 4.0–10.5)
nRBC: 0 % (ref 0.0–0.2)

## 2021-12-21 LAB — RESEARCH LABS

## 2021-12-21 LAB — GENETIC SCREENING ORDER

## 2021-12-21 MED ORDER — NYSTATIN 100000 UNIT/GM EX POWD: 1.0000 | Freq: Two times a day (BID) | CUTANEOUS | 1 refills | Status: DC

## 2021-12-21 NOTE — Progress Notes (Signed)
Alligator Work  Initial Assessment   MAHAGONY GRIEB is a 71 y.o. year old female accompanied by son, Thurmond Butts. Clinical Social Work was referred by  [facility]  for assessment of psychosocial needs.   SDOH (Social Determinants of Health) assessments performed: Yes SDOH Interventions    Flowsheet Row Clinical Support from 12/21/2021 in [facility]  SDOH Interventions   Food Insecurity Interventions Intervention Not Indicated  Housing Interventions Intervention Not Indicated  Transportation Interventions Intervention Not Indicated  Financial Strain Interventions Intervention Not Indicated       SDOH Screenings   Food Insecurity: No Food Insecurity (12/21/2021)  Housing: Low Risk  (12/21/2021)  Transportation Needs: No Transportation Needs (12/21/2021)  Financial Resource Strain: Low Risk  (12/21/2021)  Tobacco Use: High Risk (12/21/2021)     Distress Screen completed: Yes    12/21/2021   12:00 PM  ONCBCN DISTRESS SCREENING  Screening Type Initial Screening  Distress experienced in past week (1-10) 1  Emotional problem type Depression;Nervousness/Anxiety      Family/Social Information:  Housing Arrangement: patient lives alone. She is widowed Family members/support persons in your life? Family (son lives in [place]) and Friends Transportation concerns: no  Employment: Retired.  Income source: Paediatric nurse concerns: No Type of concern: None Food access concerns: no Religious or spiritual practice: Yes-relies on her faith Services Currently in place:    Coping/ Adjustment to diagnosis: Patient understands treatment plan and what happens next? yes, she is still processing the information from today, but feels confident in her medical team. She is grateful it was caught early and is not as much treatment as her husband experienced 6 years ago Concerns about diagnosis and/or treatment:  general adjustment Patient  reported stressors: Anxiety/ nervousness Hopes and/or priorities: wants to try to stay positive and trust in her faith Patient enjoys time with family/ friends Current coping skills/ strengths: Capable of independent living , Armed forces logistics/support/administrative officer , Religious Affiliation , and Supportive family/friends     SUMMARY: Current SDOH Barriers:  None noted today  Clinical Social Work Clinical Goal(s):  No clinical social work goals at this time  Interventions: Discussed common feeling and emotions when being diagnosed with cancer, and the importance of support during treatment Informed patient of the support team roles and support services at [facility] Provided [place] contact information and encouraged patient to call with any questions or concerns    Follow Up Plan: Patient will contact CSW with any support or resource needs Patient verbalizes understanding of plan: Yes    Xavier Fournier E Peityn Payton, LCSW

## 2021-12-21 NOTE — Research (Signed)
[employer] 2021-05 - Specimen Collection Study to Evaluate Biomarkers in Subjects with Cancer     Patient Cynthia Mccullough was identified by Dr. Burr Medico as a potential candidate for the above listed study.  This Clinical Research Coordinator met with Cynthia Mccullough, Cynthia Mccullough, on 12/21/21 in a manner and location that ensures patient privacy to discuss participation in the above listed research study.  Patient is Accompanied by son .  A copy of the informed consent document with embedded HIPAA language was provided to the patient.  Patient reads, speaks, and understands [place].    Patient was provided with the business card of this Coordinator and encouraged to contact the research team with any questions.  Patient was provided the option of taking informed consent documents home to review and was encouraged to review at their convenience with their support network, including other care providers. Patient is comfortable with making a decision regarding study participation today.  As outlined in the informed consent form, this Coordinator and Ayonna Speranza [facility] discussed the purpose of the research study, the investigational nature of the study, study procedures and requirements for study participation, potential risks and benefits of study participation, as well as alternatives to participation. This study is not double blinded. The patient understands participation is voluntary and they may withdraw from study participation at any time.  This study does not involve randomization.  This study does not involve an investigational drug or device. This study does not involve a placebo. Patient understands enrollment is pending full eligibility review.   Confidentiality and how the patient's information will be used as part of study participation were discussed.  Patient was informed there is not reimbursement provided for their time and effort spent on trial participation.  The patient is  encouraged to discuss research study participation with their insurance provider to determine what costs they may incur as part of study participation, including research related injury.    All questions were answered to patient's satisfaction.  The informed consent with embedded HIPAA language was reviewed page by page.  The patient's mental and emotional status is appropriate to provide informed consent, and the patient verbalizes an understanding of study participation.  Patient has agreed to participate in the above listed research study and has voluntarily signed the informed consent [employer] Approved Version 14 Jan 2022with embedded HIPAA language, version [employer] Approved Version 16 Jan 2020  on 12/21/21 at 11:50AM.  The patient was provided with a copy of the signed informed consent form with embedded HIPAA language for their reference.  No study specific procedures were obtained prior to the signing of the informed consent document.  Approximately 30 minutes were spent with the patient reviewing the informed consent documents.  Patient was not requested to complete a Release of Information form.   Medical History:  High Blood Pressure  Yes Coronary Artery Disease No Lupus    No Rheumatoid Arthritis  Yes Diabetes   No      If yes, which type?      N/A Lynch Syndrome  No  Is the patient currently taking a magnesium supplement?   Yes If yes, dose and frequency? 164m, once a day  Indication? Kidney stones Start date? 12/2018 three years ago  Does the patient have a personal history of cancer (greater than 5 years ago)?  No If yes, Cancer type and date of diagnosis?   N/A  Has this previous diagnosis been treated? N/A      If  so, treatment type? N/A   Start and end dates of last treatment cycle? N/A  Does the patient have a family history of cancer in 1st or 2nd degree relatives? Yes If yes, Relationship(s) and Cancer type(s)? Mother-breast, Father-prostate, grandmother-breast,  grandfather- liver  Does the patient have history of alcohol consumption? Yes   If yes, current or former? Current  If former, year stopped? N/A Number of years? 40 years Drinks per week? 1 glass  Does the patient have history of cigarette, cigar, pipe, or chewing tobacco use?  Yes  If yes, current for former? Current  If yes, type (Cigarette, cigar, pipe, and/or chewing tobacco)? Cigarettes    If former, year stopped? N/A Number of years? 20 years Packs/number/containers per day? Pack per day    Johny Drilling, [facility] 12/21/2021 11:59 AM

## 2021-12-21 NOTE — Progress Notes (Signed)
REFERRING PROVIDER: Truitt Merle, MD  PRIMARY PROVIDER:  Chesley Noon, MD  PRIMARY REASON FOR VISIT:  1. Malignant neoplasm of upper-outer quadrant of right breast in female, estrogen receptor positive ([place])   2. Family history of breast cancer   3. Family history of prostate cancer in father     HISTORY OF PRESENT ILLNESS:   Cynthia Mccullough, a 71 y.o. female, was seen for a [place] cancer genetics consultation at the request of Dr. Burr Medico due to a personal and family history of cancer.  Cynthia Mccullough presents to clinic today to discuss the possibility of a hereditary predisposition to cancer, to discuss genetic testing, and to further clarify her future cancer risks, as well as potential cancer risks for family members.   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  CANCER HISTORY:  Oncology History  Malignant neoplasm of upper-outer quadrant of right breast in female, estrogen receptor positive ([place])  12/03/2021 Imaging    IMPRESSION: Suspicious 0.5 cm mass in the right breast at the 10 o'clock position.   12/19/2021 Initial Diagnosis   Malignant neoplasm of upper-outer quadrant of right breast in female, estrogen receptor positive ([place])    Imaging       Past Medical History:  Diagnosis Date   Allergic rhinitis    Anxiety    Asthma    Breast cancer ([place])    Depression    DES exposure in utero    Essential tremor    HLD (hyperlipidemia)    HTN (hypertension)    Kidney stones    Panic attacks     Past Surgical History:  Procedure Laterality Date   ABDOMINAL HYSTERECTOMY     BREAST BIOPSY Right 12/13/2021   [place] RT BREAST BX W LOC DEV 1ST LESION IMG BX SPEC US GUIDE 12/13/2021 [facility]AMMOGRAP[facility]   CERVICAL ABLATION     DILATION AND CURETTAGE OF UTERUS     EXCISION MORTON'S NEUROMA Right 01/02/2006   FOOT SURGERY     LAPAROSCOPIC ASSISTED VAGINAL HYSTERECTOMY N/A 04/23/2013    Procedure: LAPAROSCOPIC ASSISTED VAGINAL HYSTERECTOMY;  Surgeon: Cyril Mourning, MD;  Location: [facility];  Service: Gynecology;  Laterality: N/A;   LIPOSUCTION     SALPINGOOPHORECTOMY Bilateral 04/23/2013   Procedure: SALPINGO OOPHORECTOMY;  Surgeon: Cyril Mourning, MD;  Location: [facility];  Service: Gynecology;  Laterality: Bilateral;    Social History   Socioeconomic History   Marital status: Widowed    Spouse name: Not on file   Number of children: 2   Years of education: Not on file   Highest education level: Not on file  Occupational History   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  Drinks 1 cup of coffee a day, Married, 2 kids.  1-2 yrs college.   Home maker.    Social Determinants of Health   Financial Resource Strain: Low Risk  (12/21/2021)   Overall Financial Resource Strain (CARDIA)    Difficulty of Paying Living Expenses: Not very hard  Food Insecurity: No Food Insecurity (12/21/2021)   Hunger Vital Sign    Worried About Running Out of Food in the Last Year: Never true    Ran Out of Food in the Last Year: Never true  Transportation Needs: No Transportation Needs (12/21/2021)   PRAPARE - Hydrologist (Medical): No    Lack of Transportation (Non-Medical): No  Physical Activity: Not on file  Stress: Not on file  Social Connections: Not on file     FAMILY HISTORY:  We  obtained a detailed, 4-generation family history.  Significant diagnoses are listed below: Family History  Problem Relation Age of Onset   Breast cancer Mother 28   Heart attack Father    Coronary artery disease Father    Hypertension Father    Diabetes Father    Prostate cancer Father 70 - 57   Breast cancer Maternal Grandmother 58 - 59   Liver cancer Paternal Grandfather      Cynthia Mccullough's mother was diagnosed with breast cancer at age 64. Her maternal grandmother was diagnosed with breast cancer in her 33s, she was diagnosed with breast cancer again in her 84s, she is not sure if it was a recurrence or second primary. Her maternal grandmother died due to metastatic breast cancer at age 9.   Cynthia Mccullough father was diagnosed with prostate cancer in his 3s, he died at age 63. Her paternal grandfather was diagnosed with liver cancer, he is deceased. Cynthia Mccullough is unaware of previous family history of genetic testing for hereditary cancer risks. There is no reported Ashkenazi Jewish ancestry.   GENETIC COUNSELING ASSESSMENT: Cynthia Mccullough is a 71 y.o. female with a personal and family history of cancer which is somewhat suggestive of a hereditary predisposition to cancer. We, therefore, discussed and recommended the following at today's visit.   DISCUSSION: We discussed that 5 - 10% of cancer is hereditary, with most cases of breast cancer associated with BRCA1/2.  There are other genes that can be associated with hereditary breast cancer syndromes.  We discussed that testing is beneficial for several reasons including knowing how to follow individuals after completing their treatment, identifying whether potential treatment options would be beneficial, and understanding if other family members could be at risk for cancer and allowing them to undergo genetic testing.   We reviewed the characteristics, features and inheritance patterns of hereditary cancer syndromes. We also  discussed genetic testing, including the appropriate family members to test, the process of testing, insurance coverage and turn-around-time for results. We discussed the implications of a negative, positive, carrier and/or variant of uncertain significant result. We recommended Cynthia Mccullough pursue genetic testing for a panel that includes genes associated with breast and prostate cancer.   Cynthia Mccullough  was offered a common hereditary cancer panel (47 genes) and an expanded pan-cancer panel (77 genes). Cynthia Mccullough was informed of the benefits and limitations of each panel, including that expanded pan-cancer panels contain genes that do not have clear management guidelines at this point in time.  We also discussed that as the number of genes included on a panel increases, the chances of variants of uncertain  significance increases. After considering the benefits and limitations of each gene panel, Cynthia Mccullough elected to have [facility] CancerNext-Expanded Panel.  The CancerNext-Expanded gene panel offered by [facility] and includes sequencing, rearrangement, and RNA analysis for the following 77 genes: AIP, ALK, APC, ATM, AXIN2, BAP1, BARD1, BLM, BMPR1A, BRCA1, BRCA2, BRIP1, CDC73, CDH1, CDK4, CDKN1B, CDKN2A, CHEK2, CTNNA1, DICER1, FANCC, FH, FLCN, GALNT12, KIF1B, LZTR1, MAX, MEN1, MET, MLH1, MSH2, MSH3, MSH6, MUTYH, NBN, NF1, NF2, NTHL1, PALB2, PHOX2B, PMS2, POT1, PRKAR1A, PTCH1, PTEN, RAD51C, RAD51D, RB1, RECQL, RET, SDHA, SDHAF2, SDHB, SDHC, SDHD, SMAD4, SMARCA4, SMARCB1, SMARCE1, STK11, SUFU, TMEM127, TP53, TSC1, TSC2, VHL and XRCC2 (sequencing and deletion/duplication); EGFR, EGLN1, HOXB13, KIT, MITF, PDGFRA, POLD1, and POLE (sequencing only); EPCAM and GREM1 (deletion/duplication only).   Based on Cynthia Mccullough's personal and family history of cancer, she meets medical criteria for genetic testing. Despite that she meets criteria, she may still have an out of pocket cost. We discussed that if  her out of pocket cost for testing is over $100, the laboratory will call and confirm whether she wants to proceed with testing.  If the out of pocket cost of testing is less than $100 she will be billed by the genetic testing laboratory.   PLAN: After considering the risks, benefits, and limitations, Cynthia Mccullough provided informed consent to pursue genetic testing and the blood sample was sent to [facility] for analysis of the CancerNext-Expanded Panel. Results should be available within approximately 2-3 weeks time, at which point they will be disclosed by telephone to Cynthia Mccullough, as will any additional recommendations warranted by these results. Cynthia Mccullough will receive a summary of her genetic counseling visit and a copy of her results once available. This information will also be available in Epic.   Cynthia Mccullough questions were answered to her satisfaction today. Our contact information was provided should additional questions or concerns arise. Thank you for the referral and allowing [place] to share in the care of your patient.   Lucille Passy, MS, [facility] Genetic Counselor [email or web address] (P) [phone]  The patient was seen for a total of 20 minutes in face-to-face genetic counseling.  The patient brought her son.  Drs. Lindi Adie and/or Burr Medico were available to discuss this case as needed.   _______________________________________________________________________ For Office Staff:  Number of people involved in session: 2 Was an Intern/ student involved with case: no

## 2021-12-21 NOTE — Research (Unsigned)
[employer] 2021-05 - Specimen Collection Study to Evaluate Biomarkers in Subjects with Cancer    12/21/2021  SECOND REVIEW:  This Coordinator has reviewed this patient's inclusion and exclusion criteria as a second review and confirms Cynthia Mccullough is eligible for study participation.  Patient may continue with enrollment.   Carol Ada, RT(R)(T) Clinical Research Coordinator

## 2021-12-21 NOTE — Therapy (Signed)
OUTPATIENT PHYSICAL THERAPY BREAST CANCER BASELINE EVALUATION   Patient Name: Cynthia Mccullough MRN: [id number] DOB:11-25-1950, 71 y.o., female Today's Date: 12/21/2021  END OF SESSION:  PT End of Session - 12/21/21 0909     Visit Number 1    Number of Visits 2    Date for PT Re-Evaluation 02/15/22    PT Start Time 1022    PT Stop Time 5170   Also saw pt from 1059 to 1108 for a total of 31 minutes   PT Time Calculation (min) 22 min    Activity Tolerance Patient tolerated treatment well    Behavior During Therapy WFL for tasks assessed/performed             Past Medical History:  Diagnosis Date   Allergic rhinitis    Anxiety    Asthma    Breast cancer ([place])    Depression    DES exposure in utero    Essential tremor    HLD (hyperlipidemia)    HTN (hypertension)    Kidney stones    Panic attacks    Past Surgical History:  Procedure Laterality Date   ABDOMINAL HYSTERECTOMY     BREAST BIOPSY Right 12/13/2021   [place] RT BREAST BX W LOC DEV 1ST LESION IMG BX SPEC US GUIDE 12/13/2021 [facility]MO[facility]AP[facility]   CERVICAL ABLATION     DILATION AND CURETTAGE OF UTERUS     EXCISION MORTON'S NEUROMA Right 01/02/2006   FOOT SURGERY     LAPAROSCOPIC ASSISTED VAGINAL HYSTERECTOMY N/A 04/23/2013   Procedure: LAPAROSCOPIC ASSISTED VAGINAL HYSTERECTOMY;  Surgeon: Cyril Mourning, MD;  Location: [facility];  Service: Gynecology;  Laterality: N/A;   LIPOSUCTION     SALPINGOOPHORECTOMY Bilateral 04/23/2013   Procedure: SALPINGO OOPHORECTOMY;  Surgeon: Cyril Mourning, MD;  Location: [facility];  Service: Gynecology;  Laterality: Bilateral;   Patient Active Problem List   Diagnosis Date Noted   Malignant neoplasm of upper-outer quadrant of right breast in female, estrogen receptor positive ([place]) 12/19/2021   Rheumatoid arthritis with positive rheumatoid factor ([place])    Hypertensive crisis 03/12/2020   Depression    Hypothyroid    Chronic diarrhea    S/P laparoscopic assisted vaginal  hysterectomy ([facility]) 04/23/2013   ALLERGIC CONJUNCTIVITIS 03/26/2007   HYPERTENSION 03/26/2007   Seasonal and perennial allergic rhinitis 03/26/2007   Asthma, intermittent 03/26/2007   URTICARIA 03/26/2007   REFERRING PROVIDER: Dr. Coralie Keens  REFERRING DIAG: Right breast cancer  THERAPY DIAG:  Malignant neoplasm of upper-outer quadrant of right breast in female, estrogen receptor positive ([place])  Abnormal posture  Rationale for Evaluation and Treatment: Rehabilitation  ONSET DATE: 11/21/2021  SUBJECTIVE:  SUBJECTIVE STATEMENT: Patient reports she is here today to be seen by her medical team for her newly diagnosed right breast cancer.   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 It measures 5 mm and is located in the upper outer quadrant. It is ER/PR positive and HER2 negative with a Ki67 of 3%.   PATIENT GOALS:   reduce lymphedema risk and learn post op HEP.   PAIN:  Are you having pain? Yes - some mild left knee soreness from a fall at Thanksgiving; fell in the dark and reports no balance deficit.  PRECAUTIONS: Active CA   HAND DOMINANCE: right  WEIGHT BEARING RESTRICTIONS: No  FALLS:  Has patient fallen in last 6 months? No  LIVING ENVIRONMENT: Patient lives with: alone Lives in: House/apartment Has following equipment at home: None  OCCUPATION: Retired  LEISURE: She does not exercise  PRIOR LEVEL OF FUNCTION: Independent   OBJECTIVE:  COGNITION: Overall cognitive status: Within functional limits for tasks assessed    POSTURE:  Forward head and rounded shoulders posture  UPPER EXTREMITY AROM/PROM:  A/PROM RIGHT   eval   Shoulder extension 48  Shoulder flexion 150  Shoulder abduction 148   Shoulder internal rotation 67  Shoulder external rotation 83    (Blank rows = not tested)  A/PROM LEFT   eval  Shoulder extension 47  Shoulder flexion 134  Shoulder abduction 155  Shoulder internal rotation 67  Shoulder external rotation 72    (Blank rows = not tested)  CERVICAL AROM: All within normal limits:    Percent limited  Flexion WNL  Extension WNL  Right lateral flexion WNL  Left lateral flexion 25% limited  Right rotation WNL  Left rotation 25% limited    UPPER EXTREMITY STRENGTH: WFL  LYMPHEDEMA ASSESSMENTS:   LANDMARK RIGHT   eval  10 cm proximal to olecranon process 26  Olecranon process 24.8  10 cm proximal to ulnar styloid process 19.9  Just proximal to ulnar styloid process 15  Across hand at thumb web space 18  At base of 2nd digit 6  (Blank rows = not tested)  LANDMARK LEFT   eval  10 cm proximal to olecranon process 26  Olecranon process 25.2  10 cm proximal to ulnar styloid process 18.6  Just proximal to ulnar styloid process 14.9  Across hand at thumb web space 18.1  At base of 2nd digit 5.8  (Blank rows = not tested)  L-DEX LYMPHEDEMA SCREENING:  The patient was assessed using the L-Dex machine today to produce a lymphedema index baseline score. The patient will be reassessed on a regular basis (typically every 3 months) to obtain new L-Dex scores. If the score is > 6.5 points away from his/her baseline score indicating onset of subclinical lymphedema, it will be recommended to wear a compression garment for 4 weeks, 12 hours per day and then be reassessed. If the score continues to be > 6.5 points from baseline at reassessment, we will initiate lymphedema treatment. Assessing in this manner has a 95% rate of preventing clinically significant lymphedema.   L-DEX FLOWSHEETS - 12/21/21 0900       L-DEX LYMPHEDEMA SCREENING   Measurement Type Unilateral    L-DEX MEASUREMENT EXTREMITY Upper Extremity    POSITION  Standing    DOMINANT  SIDE Right    At Risk Side Right    BASELINE SCORE (UNILATERAL) 0.3  PATIENT EDUCATION:  Education details: Lymphedema risk reduction and post op shoulder/posture HEP Person educated: Patient Education method: Explanation, Demonstration, Handout Education comprehension: Patient verbalized understanding and returned demonstration  HOME EXERCISE PROGRAM: Patient was instructed today in a home exercise program today for post op shoulder range of motion. These included active assist shoulder flexion in sitting, scapular retraction, wall walking with shoulder abduction, and hands behind head external rotation.  She was encouraged to do these twice a day, holding 3 seconds and repeating 5 times when permitted by her physician.   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 It measures 5 mm and is located in the upper outer quadrant. It is ER/PR positive and HER2 negative with a Ki67 of 3%.  Her multidisciplinary medical team met prior to her assessments to determine a recommended treatment plan. She is planning to have a right lumpectomy and sentinel node biopsy followed by radiation and anti-estrogen therapy. She will benefit from a post op PT reassessment to determine needs and from L-Dex screens every 3 months for 2 years to detect subclinical lymphedema.  Pt will benefit from skilled therapeutic intervention to improve on the following deficits: Decreased knowledge of precautions, impaired UE functional use, pain, decreased ROM, postural dysfunction.   PT treatment/interventions: ADL/self-care home management, pt/family education, therapeutic exercise  REHAB POTENTIAL: Excellent  CLINICAL DECISION MAKING: Stable/uncomplicated  EVALUATION COMPLEXITY: Low   GOALS: Goals reviewed with patient? YES  LONG TERM GOALS: (STG=LTG)    Name Target Date Goal status  1 Pt will be able to  verbalize understanding of pertinent lymphedema risk reduction practices relevant to her dx specifically related to skin care.  Baseline:  No knowledge 12/21/2021 Achieved at eval  2 Pt will be able to return demo and/or verbalize understanding of the post op HEP related to regaining shoulder ROM. Baseline:  No knowledge 12/21/2021 Achieved at eval  3 Pt will be able to verbalize understanding of the importance of attending the post op After Breast CA Class for further lymphedema risk reduction education and therapeutic exercise.  Baseline:  No knowledge 12/21/2021 Achieved at eval  4 Pt will demo she has regained full shoulder ROM and function post operatively compared to baselines.  Baseline: See objective measurements taken today. 02/15/2022     PLAN:  PT FREQUENCY/DURATION: EVAL and 1 follow up appointment.   PLAN FOR NEXT SESSION: will reassess 3-4 weeks post op to determine needs.   Patient will follow up at outpatient cancer rehab 3-4 weeks following surgery.  If the patient requires physical therapy at that time, a specific plan will be dictated and sent to the referring physician for approval. The patient was educated today on appropriate basic range of motion exercises to begin post operatively and the importance of attending the After Breast Cancer class following surgery.  Patient was educated today on lymphedema risk reduction practices as it pertains to recommendations that will benefit the patient immediately following surgery.  She verbalized good understanding.    Physical Therapy Information for After Breast Cancer Surgery/Treatment:  Lymphedema is a swelling condition that you may be at risk for in your arm if you have lymph nodes removed from the armpit area.  After a sentinel node biopsy, the risk is approximately 5-9% and is higher after an axillary node dissection.  There is treatment available for this condition and it is not life-threatening.  Contact your physician or  physical therapist with concerns.  You may begin the 4 shoulder/posture exercises (see additional sheet) when permitted by your physician (typically a week after surgery).  If you have drains, you may need to wait until those are removed before beginning range of motion exercises.  A general recommendation is to not lift your arms above shoulder height until drains are removed.  These exercises should be done to your tolerance and gently.  This is not a "no pain/no gain" type of recovery so listen to your body and stretch into the range of motion that you can tolerate, stopping if you have pain.  If you are having immediate reconstruction, ask your plastic surgeon about doing exercises as he or she may want you to wait. We encourage you to attend the free one time ABC (After Breast Cancer) class offered by [facility].  You will learn information related to lymphedema risk, prevention and treatment and additional exercises to regain mobility following surgery.  You can call [phone] for more information.  This is offered the 1st and 3rd Monday of each month.  You only attend the class one time. While undergoing any medical procedure or treatment, try to avoid blood pressure being taken or needle sticks from occurring on the arm on the side of cancer.   This recommendation begins after surgery and continues for the rest of your life.  This may help reduce your risk of getting lymphedema (swelling in your arm). An excellent resource for those seeking information on lymphedema is the National Lymphedema Network's web site. It can be accessed at [place].org If you notice swelling in your hand, arm or breast at any time following surgery (even if it is many years from now), please contact your doctor or physical therapist to discuss this.  Lymphedema can be treated at any time but it is easier for you if it is treated early on.  If you feel like your shoulder motion is not returning  to normal in a reasonable amount of time, please contact your surgeon or physical therapist.  [place] [phone]. [address], [address], [place] [address]  ABC CLASS After Breast Cancer Class  After Breast Cancer Class is a specially designed exercise class to assist you in a safe recover after having breast cancer surgery.  In this class you will learn how to get back to full function whether your drains were just removed or if you had surgery a month ago.  This one-time class is held the 1st and 3rd Monday of every month from 11:00 a.m. until 12:00 noon virtually.  This class is FREE and space is limited. For more information or to register for the next available class, call [phone].  Class Goals  Understand specific stretches to improve the flexibility of you chest and shoulder. Learn ways to safely strengthen your upper body and improve your posture. Understand the warning signs of infection and why you may be at risk for an arm infection. Learn about Lymphedema and prevention.  ** You do not attend this class until after surgery.  Drains must be removed to participate  Patient was instructed today in a home exercise program today for post op shoulder range of motion. These included active assist shoulder flexion in sitting, scapular retraction, wall walking with shoulder abduction, and hands behind head external rotation.  She was encouraged to do these twice a day, holding 3 seconds and repeating 5 times when permitted by her physician.  Myra Gianotti  Tamala Julian, PT 12/21/21 11:27 AM

## 2021-12-21 NOTE — Research (Signed)
[employer] 2021-05 - Specimen Collection Study to Evaluate Biomarkers in Subjects with Cancer   This Nurse has reviewed this patient's inclusion and exclusion criteria and confirmed Cynthia Mccullough is eligible for study participation.  Patient will continue with enrollment.  Eligibility confirmed by treating investigator, who also agrees that patient should proceed with enrollment.  Marjie Skiff Elody Kleinsasser, RN, BSN, [facility]  [facility]  [facility] Clinical Research Nurse [facility] - Inpatient Direct Dial [phone]  Pager [phone] 12/21/2021 11:48 AM

## 2021-12-21 NOTE — Progress Notes (Signed)
[facility]   [phone] [phone]   Clinic New Consult Note   Patient Care Team: Badger, Michael C, MD as PCP - General (Family Medicine) Martini, Keisha N, RN as Oncology Nurse Navigator Stuart, Dawn C, RN as Oncology Nurse Navigator Blackman, Douglas, MD as Consulting Physician (General Surgery) Dellie Piasecki, MD as Consulting Physician (Hematology) Moody, John, MD as Consulting Physician (Radiation Oncology)  Date of Service:  12/21/2021   CHIEF COMPLAINTS/PURPOSE OF CONSULTATION:   Breast Cancer, ER  REFERRING PHYSICIAN:  [facility]   ASSESSMENT & PLAN:  Cynthia Mccullough is a 71 y.o. postmenopausal woman, presented with screening discovered right breast cancer   1.  Malignant neoplasm of upper outer quadrant of right breast, estrogen receptor positive, invasive lobular carcinoma, [id number] stage IA --We discussed her imaging findings and the biopsy results in great details. -Giving the early stage disease, she is a candidate for lumpectomy and sentinel lymph node biopsy. She was seen by Dr. Blackman today and likely will proceed with surgery soon.  -Given the small size of her tumor, she will likely did not need adjuvant chemotherapy.  However if her tumor is more than 1 cm on the surgical sample, or if she has positive lymph nodes, I recommend Oncotype Dx test on the surgical sample and we'll make a decision about adjuvant chemotherapy based on the Oncotype result. Written material of this test was given to her. She is in good health overall, would be a candidate for chemotherapy if her Oncotype recurrence score is high. -We discussed that lobular carcinoma sometimes is difficult to see on mammogram or ultrasound, and surgical pathology may review more advanced disease.  However given the overall small tumor size on mammogram, I do not think she needs an MRI for further evaluation before surgery. -Giving the strong ER and PR expression in  her postmenopausal status, I recommend adjuvant endocrine therapy with aromatase inhibitor or tamoxifen for a total of 5-10 years to reduce the risk of cancer recurrence. Potential benefits and side effects were discussed with patient and she is interested. -She was also seen by radiation oncologist Dr. Moody today.  She would benefit from adjuvant radiation. -We also discussed the breast cancer surveillance after her surgery. She will continue annual screening mammogram, self exam, and a routine office visit with lab and exam with us. -I encouraged her to have healthy diet and exercise regularly.   2. Bone Health -will obtain a baseline DEXA scan before she starts AI   3.  Smoking cessation -I encouraged her to quit smoking, we discussed the option of nicotine patch, Chantix, and smoking cessation class. -She may try nicotine patch, or discussed with her primary care physician -I also encouraged her to reduce alcohol consumption.  PLAN:  Discuss Genetic Testing -Discuss  Surger, antiestrogen therapy,radiation - Bone Density Scan February - pt interested in research trail, will refer her   Oncology History  Malignant neoplasm of upper-outer quadrant of right breast in female, estrogen receptor positive (HCC)  12/03/2021 Imaging    IMPRESSION: Suspicious 0.5 cm mass in the right breast at the 10 o'clock position.   12/19/2021 Initial Diagnosis   Malignant neoplasm of upper-outer quadrant of right breast in female, estrogen receptor positive (HCC)    Imaging        HISTORY OF PRESENTING ILLNESS:  Cynthia Mccullough 71 y.o. female is a here because of breast cancer. The [facility]. The patient presents to   the clinic today accompanied by son.   She had routine screening mammography on 12/03/2021 showing a possible abnormality in the right  breast. She underwent diagnostic mammography and right breast ultrasonography on 12/03/2021 showing: irregular  hypoechoic mass in the right breast at 10 o'clock 5cm from nipple measuring 0.5 x 0.3 x 0.4 cm..Biopsy on 12/13/2021 showed grade 2 invasive lobular carcinoma and lobular carcinoma in situ.. Prognostic indicators significant for: estrogen receptor, 100% positive and progesterone receptor, 20% positive  Proliferation marker Ki67 at 3%. HER2 negative by FISH.    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  She has a PMHx of.Hypertension ,mother breast, father prostate cancer.   Socially...2 children  She drinks wine every day, smokes 1 pack a day.  GYN HISTORY   Menarchal:16 LMP: UNKNOWN Contraceptive: none  HRT: none  GP: G2,P2    REVIEW OF SYSTEMS:     Constitutional: Denies fevers, chills or abnormal night sweats Eyes: Denies blurriness of vision, double vision or watery eyes Ears, nose, mouth, throat, and face: Denies mucositis or sore throat Respiratory: Denies cough, dyspnea or wheezes Cardiovascular: Denies palpitation, chest discomfort or lower extremity swelling Gastrointestinal:  Denies nausea, heartburn or change in bowel habits Skin: Redness under both breast Lymphatics: Denies new lymphadenopathy or easy bruising Neurological:Denies numbness, tingling or new weaknesses Behavioral/Psych: Anxiety,and Depression,Mood is stable, no new changes   All other systems were reviewed with the patient and are negative.   MEDICAL HISTORY:  Past Medical History:  Diagnosis Date   Allergic rhinitis    Anxiety    Asthma    Breast cancer (HCC)    Depression    DES exposure in utero    Essential tremor    HLD (hyperlipidemia)    HTN (hypertension)    Kidney stones    Panic attacks     SURGICAL HISTORY: Past Surgical History:  Procedure Laterality Date   ABDOMINAL HYSTERECTOMY     BREAST BIOPSY Right 12/13/2021   US RT  BREAST BX W LOC DEV 1ST LESION IMG BX SPEC US GUIDE 12/13/2021 [facility] MAMMOGRAPHY   CERVICAL ABLATION     DILATION AND CURETTAGE OF UTERUS     EXCISION MORTON'S NEUROMA Right 01/02/2006   FOOT SURGERY     LAPAROSCOPIC ASSISTED VAGINAL HYSTERECTOMY N/A 04/23/2013   Procedure: LAPAROSCOPIC ASSISTED VAGINAL HYSTERECTOMY;  Surgeon: Michelle L Grewal, MD;  Location: [facility];  Service: Gynecology;  Laterality: N/A;   LIPOSUCTION     SALPINGOOPHORECTOMY Bilateral 04/23/2013   Procedure: SALPINGO OOPHORECTOMY;  Surgeon: Michelle L Grewal, MD;  Location: [facility];  Service: Gynecology;  Laterality: Bilateral;    SOCIAL HISTORY: Social History   Socioeconomic History   Marital status: Widowed    Spouse name: Not on file   Number of children: 2   Years of education: Not on file   Highest education level: Not on file  Occupational History   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  Drinks 1 cup of coffee a day, Married, 2 kids.  1-2 yrs college.   Home maker.    Social Determinants of Health   Financial Resource Strain: Low Risk  (12/21/2021)   Overall Financial Resource Strain (CARDIA)    Difficulty of Paying Living Expenses: Not very hard  Food Insecurity: No Food Insecurity (12/21/2021)   Hunger Vital Sign    Worried About Running Out of Food in the Last Year: Never true    Ran Out of Food in the Last Year: Never true   Transportation Needs: No Transportation Needs (12/21/2021)   PRAPARE - Transportation    Lack of Transportation (Medical): No    Lack of Transportation (Non-Medical): No  Physical Activity: Not on file  Stress: Not on file  Social Connections: Not on file  Intimate Partner Violence: Not on file    FAMILY HISTORY: Family History  Problem Relation Age of Onset   Breast cancer Mother 96   Heart attack Father    Coronary artery disease Father    Hypertension Father    Diabetes Father    Prostate cancer Father 70 - 79   Breast cancer Maternal Grandmother 50 - 59   Liver cancer Paternal Grandfather     ALLERGIES:  is allergic to amoxicillin, metronidazole, penicillins, sulfonamide derivatives, cheese, atorvastatin, and rosuvastatin.  MEDICATIONS:  Current Outpatient Medications  Medication Sig Dispense Refill   albuterol (PROVENTIL HFA;VENTOLIN HFA) 108 (90 Base) MCG/ACT inhaler Inhale 2 puffs into the lungs every 4 (four) hours as needed for wheezing or shortness of breath. 1 Inhaler 2   allopurinol (ZYLOPRIM) 100 MG tablet Take 100 mg by mouth 3 (three) times daily.     ALPRAZolam (XANAX) 0.5 MG tablet Take 0.5-0.75 mg by mouth at bedtime.     amLODipine (NORVASC) 5 MG tablet Take 1 tablet (5 mg total) by mouth daily. 30 tablet 1   BEELITH 362-20 MG TABS tablet Take 1 tablet by mouth daily.     budesonide (ENTOCORT EC) 3 MG 24 hr capsule Take 9 mg by mouth daily.     cetirizine (ZYRTEC) 10 MG tablet Take 10 mg by mouth at bedtime.      colestipol (COLESTID) 1 g tablet Take 1 tablet by mouth daily.     cyanocobalamin (,VITAMIN B-12,) 1000 MCG/ML injection Inject 1 mL into the muscle every 30 (thirty) days.     fenofibrate 160 MG tablet Take 160 mg by mouth at bedtime.      furosemide (LASIX) 20 MG tablet Take 1 tablet (20 mg total) by mouth daily. 30 tablet 0   irbesartan (AVAPRO) 150 MG tablet Take 1 tablet (150 mg total) by mouth daily. 30 tablet 1   latanoprost (XALATAN) 0.005  % ophthalmic solution Place 1 drop into both eyes at bedtime.     levothyroxine (SYNTHROID, LEVOTHROID) 50 MCG tablet Take 50 mcg by mouth daily before breakfast.  0   nebivolol (BYSTOLIC) 10 MG tablet Take 10 mg by mouth at bedtime.     pantoprazole (PROTONIX) 40 MG tablet Take 40 mg by mouth daily.     Polyvinyl Alcohol-Povidone (REFRESH OP)   Place 1 drop into both eyes daily as needed (dry eyes).      potassium chloride (K-DUR) 10 MEQ tablet Take 1 tablet (10 mEq total) by mouth 2 (two) times daily. 30 tablet 30   traZODone (DESYREL) 100 MG tablet Take 1 tablet (100 mg total) by mouth at bedtime. 30 tablet 0   Vilazodone HCl (VIIBRYD) 40 MG TABS Take 40 mg by mouth daily.      Vitamin D, Ergocalciferol, (DRISDOL) 1.25 MG (50000 UT) CAPS capsule Take 1 capsule by mouth once a week.     zaleplon (SONATA) 10 MG capsule Take 2 capsules by mouth at bedtime.     No current facility-administered medications for this visit.    PHYSICAL EXAMINATION: ECOG PERFORMANCE STATUS: 0 - Asymptomatic  Vitals:   12/21/21 0853  BP: (!) 187/83  Pulse: 75  Resp: 20  Temp: 97.8 F (36.6 C)  SpO2: 97%   Filed Weights   12/21/21 0853  Weight: 182 lb (82.6 kg)    GENERAL:alert, no distress and comfortable SKIN: skin color, texture, turgor are normal, no rashes or significant lesions EYES: normal, Conjunctiva are pink and non-injected, sclera clear  NECK: supple, thyroid normal size, non-tender, without nodularity LYMPH: (-)  no palpable lymphadenopathy in the cervical, axillary  LUNGS: clear to auscultation and percussion with normal breathing effort HEART: regular rate & rhythm and no murmurs and no lower extremity edema ABDOMEN:abdomen soft, non-tender and normal bowel sounds Musculoskeletal:no cyanosis of digits and no clubbing  NEURO: alert & oriented x 3 with fluent speech, no focal motor/sensory deficits BREAST:  Right/ Left  Breast fungal irritation. Breast reduction on Left breast. No  palpable mass, nodules or adenopathy bilaterally. Breast exam benign.  LABORATORY DATA:  I have reviewed the data as listed    Latest Ref Rng & Units 12/21/2021    8:35 AM 03/13/2020    5:15 AM 03/12/2020    5:41 PM  CBC  WBC 4.0 - 10.5 K/uL 5.7  5.7  8.6   Hemoglobin 12.0 - 15.0 g/dL 15.2  14.8  14.0   Hematocrit 36.0 - 46.0 % 44.4  43.5  42.4   Platelets 150 - 400 K/uL 234  185  185        Latest Ref Rng & Units 12/21/2021    8:35 AM 03/13/2020    5:15 AM 03/12/2020    5:41 PM  CMP  Glucose 70 - 99 mg/dL 101  102  111   BUN 8 - 23 mg/dL _0 Creatinine 0.44 - 1.00 mg/dL 0.83  0.81  0.73   Sodium 135 - 145 mmol/L 141  138  136   Potassium 3.5 - 5.1 mmol/L 3.8  3.6  3.8   Chloride 98 - 111 mmol/L 103  98  99   CO2 22 - 32 mmol/L _1 Calcium 8.9 - 10.3 mg/dL 9.5  9.7  9.3   Total Protein 6.5 - 8.1 g/dL 6.6   6.2   Total Bilirubin 0.3 - 1.2 mg/dL 0.7   0.9   Alkaline Phos 38 - 126 U/L 34   50   AST 15 - 41 U/L 20   16   ALT 0 - 44 U/L 21   23      RADIOGRAPHIC STUDIES: I have personally reviewed the radiological images as listed and agreed with the findings in the report. [place] RT BREAST BX W LOC DEV 1ST LESION IMG  BX SPEC US GUIDE  Addendum Date: 12/15/2021   ADDENDUM REPORT: 12/15/2021 13:36 ADDENDUM: PATHOLOGY ADDENDUM: Pathology RIGHT breast needle core biopsy 10 o'clock mass, ribbon clip: - INVASIVE LOBULAR CARCINOMA, GRADE 2 (GLANDULAR DIFFERENTIATION, SCORE 3; NUCLEAR PLEOMORPHISM, SCORE 2; MITOTIC RATE, SCORE 1), LONGEST INVOLVED SEGMENT WITH TUMOR 4 MM - LOBULAR CARCINOMA IN SITU, GRADE II. Pathology concordance with imaging findings: Yes Recommendation: Consultation for treatment plan At the request of the patient, I spoke with her by telephone on 12/14/2021 and 12/15/2021 she reports doing well after the biopsy . The patient is scheduled for Multidisciplinary Clinic on 12/21/2021. Electronically Signed   By: Nolon Nations M.D.   On: 12/15/2021 13:36    Result Date: 12/15/2021 CLINICAL DATA:  Patient presents for ultrasound-guided core biopsy of RIGHT breast mass. EXAM: ULTRASOUND GUIDED RIGHT BREAST CORE NEEDLE BIOPSY COMPARISON:  Previous exam(s). PROCEDURE: I met with the patient and we discussed the procedure of ultrasound-guided biopsy, including benefits and alternatives. We discussed the high likelihood of a successful procedure. We discussed the risks of the procedure, including infection, bleeding, tissue injury, clip migration, and inadequate sampling. Informed written consent was given. The usual time-out protocol was performed immediately prior to the procedure. Lesion quadrant: UPPER-OUTER QUADRANT RIGHT breast Using sterile technique and 1% Lidocaine as local anesthetic, under direct ultrasound visualization, a 12 gauge spring-loaded device was used to perform biopsy of mass in the 10 o'clock location of the RIGHT breast using a inferior to superior approach. At the conclusion of the procedure ribbon shaped tissue marker clip was deployed into the biopsy cavity. Follow up 2 view mammogram was performed and dictated separately. IMPRESSION: Ultrasound guided biopsy of RIGHT breast mass. No apparent complications. Electronically Signed: By: Nolon Nations M.D. On: 12/13/2021 13:55  MM CLIP PLACEMENT RIGHT  Result Date: 12/13/2021 CLINICAL DATA:  Status post ultrasound-guided core biopsy of RIGHT breast mass. EXAM: 3D DIAGNOSTIC RIGHT MAMMOGRAM POST ULTRASOUND BIOPSY COMPARISON:  Previous exam(s). FINDINGS: 3D Mammographic images were obtained following ultrasound guided biopsy of mass in the 10 o'clock location of the RIGHT breast and placement of a ribbon shaped. The biopsy marking clip is in expected position at the site of biopsy. IMPRESSION: Appropriate positioning of the ribbon shaped biopsy marking clip at the site of biopsy in the UPPER-OUTER QUADRANT RIGHT breast. Final Assessment: Post Procedure Mammograms for Marker Placement  Electronically Signed   By: Nolon Nations M.D.   On: 12/13/2021 14:22  MM DIAG BREAST TOMO UNI RIGHT  Result Date: 12/03/2021 CLINICAL DATA:  Screening recall for possible right breast mass. EXAM: DIGITAL DIAGNOSTIC UNILATERAL RIGHT MAMMOGRAM WITH TOMOSYNTHESIS; ULTRASOUND RIGHT BREAST LIMITED TECHNIQUE: Right digital diagnostic mammography and breast tomosynthesis was performed.; Targeted ultrasound examination of the right breast was performed COMPARISON:  Previous exam(s). ACR Breast Density Category b: There are scattered areas of fibroglandular density. FINDINGS: Spot compression tomograms were performed over the upper outer right breast demonstrating a persistent area of distortion with a probable associated small mass. Targeted ultrasound of upper-outer right breast was performed. There is an irregular hypoechoic mass in the right breast at 10 o'clock 5 cm from nipple measuring 0.5 x 0.3 x 0.4 cm. This corresponds well with the distortion/mass seen in the upper-outer right breast at mammography. No lymphadenopathy seen in the right axilla. IMPRESSION: Suspicious 0.5 cm mass in the right breast at the 10 o'clock position. RECOMMENDATION: Recommend ultrasound-guided core biopsy of the mass in the right breast at the 10 o'clock position. I have discussed the  findings and recommendations with the patient. If applicable, a reminder letter will be sent to the patient regarding the next appointment. BI-RADS CATEGORY  4: Suspicious. Electronically Signed   By: Everlean Alstrom M.D.   On: 12/03/2021 10:35  US BREAST LTD UNI RIGHT INC AXILLA  Result Date: 12/03/2021 CLINICAL DATA:  Screening recall for possible right breast mass. EXAM: DIGITAL DIAGNOSTIC UNILATERAL RIGHT MAMMOGRAM WITH TOMOSYNTHESIS; ULTRASOUND RIGHT BREAST LIMITED TECHNIQUE: Right digital diagnostic mammography and breast tomosynthesis was performed.; Targeted ultrasound examination of the right breast was performed COMPARISON:  Previous  exam(s). ACR Breast Density Category b: There are scattered areas of fibroglandular density. FINDINGS: Spot compression tomograms were performed over the upper outer right breast demonstrating a persistent area of distortion with a probable associated small mass. Targeted ultrasound of upper-outer right breast was performed. There is an irregular hypoechoic mass in the right breast at 10 o'clock 5 cm from nipple measuring 0.5 x 0.3 x 0.4 cm. This corresponds well with the distortion/mass seen in the upper-outer right breast at mammography. No lymphadenopathy seen in the right axilla. IMPRESSION: Suspicious 0.5 cm mass in the right breast at the 10 o'clock position. RECOMMENDATION: Recommend ultrasound-guided core biopsy of the mass in the right breast at the 10 o'clock position. I have discussed the findings and recommendations with the patient. If applicable, a reminder letter will be sent to the patient regarding the next appointment. BI-RADS CATEGORY  4: Suspicious. Electronically Signed   By: Everlean Alstrom M.D.   On: 12/03/2021 10:35    No orders of the defined types were placed in this encounter.   All questions were answered. The patient knows to call the clinic with any problems, questions or concerns. The total time spent in the appointment was 40 minutes.     Truitt Merle, MD 12/21/2021 4:11 PM  I, Audry Riles, am acting as scribe for Truitt Merle, MD.   I have reviewed the above documentation for accuracy and completeness, and I agree with the above.

## 2021-12-28 ENCOUNTER — Encounter: Payer: Self-pay | Admitting: *Deleted

## 2021-12-28 ENCOUNTER — Other Ambulatory Visit: Payer: Self-pay | Admitting: Surgery

## 2021-12-28 DIAGNOSIS — Z170 Estrogen receptor positive status [ER+]: Secondary | ICD-10-CM

## 2021-12-28 DIAGNOSIS — Z853 Personal history of malignant neoplasm of breast: Secondary | ICD-10-CM

## 2021-12-29 ENCOUNTER — Encounter: Payer: Self-pay | Admitting: *Deleted

## 2021-12-29 ENCOUNTER — Telehealth: Payer: Self-pay | Admitting: *Deleted

## 2021-12-29 NOTE — Telephone Encounter (Signed)
Left vm regarding [facility] from 12/21/21. Contact information provided for questions or need.

## 2021-12-30 ENCOUNTER — Other Ambulatory Visit: Payer: Self-pay

## 2021-12-30 ENCOUNTER — Encounter (HOSPITAL_BASED_OUTPATIENT_CLINIC_OR_DEPARTMENT_OTHER): Payer: Self-pay | Admitting: Surgery

## 2022-01-03 ENCOUNTER — Telehealth: Payer: Self-pay | Admitting: Genetic Counselor

## 2022-01-03 ENCOUNTER — Encounter: Payer: Self-pay | Admitting: Genetic Counselor

## 2022-01-03 DIAGNOSIS — Z1379 Encounter for other screening for genetic and chromosomal anomalies: Secondary | ICD-10-CM | POA: Insufficient documentation

## 2022-01-03 NOTE — Telephone Encounter (Signed)
I contacted Ms. Medero to discuss her genetic testing results. No pathogenic variants were identified in the 77 genes analyzed. Detailed clinic note to follow.  The test report has been scanned into EPIC and is located under the Molecular Pathology section of the Results Review tab.  A portion of the result report is included below for reference.   Lucille Passy, MS, [facility] Genetic Counselor [email or web address] (P) [phone]

## 2022-01-05 ENCOUNTER — Other Ambulatory Visit: Payer: Self-pay

## 2022-01-05 ENCOUNTER — Encounter (HOSPITAL_BASED_OUTPATIENT_CLINIC_OR_DEPARTMENT_OTHER): Payer: Self-pay

## 2022-01-05 ENCOUNTER — Inpatient Hospital Stay (HOSPITAL_BASED_OUTPATIENT_CLINIC_OR_DEPARTMENT_OTHER): Admission: RE | Admit: 2022-01-05 | Payer: Medicare HMO | Source: Ambulatory Visit

## 2022-01-05 ENCOUNTER — Encounter (HOSPITAL_BASED_OUTPATIENT_CLINIC_OR_DEPARTMENT_OTHER)
Admission: RE | Admit: 2022-01-05 | Discharge: 2022-01-05 | Disposition: A | Discharge status: Home / Self Care | Payer: Medicare HMO | Source: Ambulatory Visit | Attending: Surgery | Admitting: Surgery

## 2022-01-05 ENCOUNTER — Ambulatory Visit
Admission: RE | Admit: 2022-01-05 | Discharge: 2022-01-05 | Disposition: A | Discharge status: Home / Self Care | Payer: Medicare HMO | Source: Ambulatory Visit | Attending: Surgery | Admitting: Surgery

## 2022-01-05 DIAGNOSIS — F32A Depression, unspecified: Secondary | ICD-10-CM | POA: Diagnosis not present

## 2022-01-05 DIAGNOSIS — Z79890 Hormone replacement therapy (postmenopausal): Secondary | ICD-10-CM | POA: Diagnosis not present

## 2022-01-05 DIAGNOSIS — I10 Essential (primary) hypertension: Secondary | ICD-10-CM | POA: Diagnosis not present

## 2022-01-05 DIAGNOSIS — K219 Gastro-esophageal reflux disease without esophagitis: Secondary | ICD-10-CM | POA: Diagnosis not present

## 2022-01-05 DIAGNOSIS — J4520 Mild intermittent asthma, uncomplicated: Secondary | ICD-10-CM | POA: Diagnosis not present

## 2022-01-05 DIAGNOSIS — E039 Hypothyroidism, unspecified: Secondary | ICD-10-CM | POA: Diagnosis not present

## 2022-01-05 DIAGNOSIS — Z79899 Other long term (current) drug therapy: Secondary | ICD-10-CM | POA: Diagnosis not present

## 2022-01-05 DIAGNOSIS — F419 Anxiety disorder, unspecified: Secondary | ICD-10-CM | POA: Diagnosis not present

## 2022-01-05 DIAGNOSIS — M069 Rheumatoid arthritis, unspecified: Secondary | ICD-10-CM | POA: Diagnosis not present

## 2022-01-05 DIAGNOSIS — C50411 Malignant neoplasm of upper-outer quadrant of right female breast: Secondary | ICD-10-CM | POA: Diagnosis present

## 2022-01-05 DIAGNOSIS — Z01818 Encounter for other preprocedural examination: Secondary | ICD-10-CM | POA: Insufficient documentation

## 2022-01-05 DIAGNOSIS — Z87891 Personal history of nicotine dependence: Secondary | ICD-10-CM | POA: Diagnosis not present

## 2022-01-05 DIAGNOSIS — Z853 Personal history of malignant neoplasm of breast: Secondary | ICD-10-CM

## 2022-01-05 DIAGNOSIS — Z170 Estrogen receptor positive status [ER+]: Secondary | ICD-10-CM | POA: Diagnosis not present

## 2022-01-05 HISTORY — PX: BREAST BIOPSY: SHX20

## 2022-01-05 LAB — BASIC METABOLIC PANEL
Anion gap: 8 (ref 5–15)
BUN: 14 mg/dL (ref 8–23)
CO2: 27 mmol/L (ref 22–32)
Calcium: 9.2 mg/dL (ref 8.9–10.3)
Chloride: 100 mmol/L (ref 98–111)
Creatinine, Ser: 0.87 mg/dL (ref 0.44–1.00)
GFR, Estimated: >60 mL/min (ref >60)
Glucose, Bld: 97 mg/dL (ref 70–99)
Potassium: 3.9 mmol/L (ref 3.5–5.1)
Sodium: 135 mmol/L (ref 135–145)

## 2022-01-05 NOTE — Progress Notes (Signed)
Patient provided with CHG soap as well as instructions         Patient Instructions  The night before surgery:  No food after midnight. ONLY clear liquids after midnight  The day of surgery (if you do NOT have diabetes):  Drink ONE (1) Pre-Surgery Clear Ensure as directed.   This drink was given to you during your hospital  pre-op appointment visit. The pre-op nurse will instruct you on the time to drink the  Pre-Surgery Ensure depending on your surgery time. Finish the drink at the designated time by the pre-op nurse.  Nothing else to drink after completing the  Pre-Surgery Clear Ensure.  The day of surgery (if you have diabetes): Drink ONE (1) Gatorade 2 (G2) as directed. This drink was given to you during your hospital  pre-op appointment visit.  The pre-op nurse will instruct you on the time to drink the   Gatorade 2 (G2) depending on your surgery time. Color of the Gatorade may vary. Red is not allowed. Nothing else to drink after completing the  Gatorade 2 (G2).         If you have questions, please contact your surgeon's office.

## 2022-01-08 NOTE — H&P (Signed)
REFERRING PHYSICIAN: Truitt Merle, MD  PROVIDER: Beverlee Nims, MD  MRN: [id number] DOB: November 17, 1950  Subjective  Chief Complaint: Breast Cancer   History of Present Illness: Cynthia Mccullough is a 72 y.o. female who is seenas an office consultation for evaluation of Breast Cancer .  This is a 72 year old female who was found on recent screening mammography to have a right breast mass. The mass under ultrasound measured 0.5 mm. Her axilla was unremarkable. 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 It was grade 2. It was 100% ER positive, 20% PR positive, HER2 negative, and had a Ki-67 of 3%. She has had no previous problems regarding her breast. She is otherwise healthy without complaints. She tolerated the biopsy well. She has no cardiopulmonary issues.  Review of Systems: A complete review of systems was obtained from the patient. I have reviewed this information and discussed as appropriate with the patient. See HPI as well for other ROS.  ROS  Medical History: Past Medical History: Diagnosis Date Anxiety Asthma without status asthmaticus Depression GERD (gastroesophageal reflux disease) Glaucoma (increased eye pressure) History of cancer Hyperlipidemia Hypertension Sleep apnea Thyroid disease  Patient Active Problem List Diagnosis Chest pain, atypical Hypertriglyceridemia Essential hypertension GERD (gastroesophageal reflux disease) Anxiety state Asthma, intermittent Malignant neoplasm of upper-outer quadrant of right breast in female, estrogen receptor positive  Past Surgical History: Procedure Laterality Date HYSTERECTOMY N/A 04/23/2013 vaginal hysterectomy, salpingoophorectomy Excision Morton's Neuroma N/A 2008   Allergies Allergen Reactions Penicillins Anaphylaxis and Hives Rosuvastatin Itching Lipitor [Atorvastatin] Other (See Comments) Zocor [Simvastatin] Other (See  Comments) Amoxicillin Hives Metronidazole Hives Sulfa (Sulfonamide Antibiotics) Hives  Current Outpatient Medications on File Prior to Visit Medication Sig Dispense Refill albuterol 90 mcg/actuation inhaler Inhale 2 inhalations into the lungs every 6 (six) hours as needed for Shortness of Breath or Wheezing allopurinoL (ZYLOPRIM) 100 MG tablet Take 100 mg by mouth 2 (two) times daily amLODIPine (NORVASC) 5 MG tablet Take 5 mg by mouth once daily colestipoL (COLESTID) 1 gram tablet Take 1 g by mouth once daily irbesartan (AVAPRO) 150 MG tablet Take 150 mg by mouth once daily levothyroxine (SYNTHROID) 50 MCG tablet Take 1 tablet by mouth once daily ALPRAZolam (XANAX) 0.5 MG tablet Take 0.5 mg by mouth nightly as needed. armodafinil (NUVIGIL) 150 mg tablet Take 150 mg by mouth once daily. cetirizine (ZYRTEC) 10 MG tablet Take 10 mg by mouth. cloNIDine HCl (CATAPRES) 0.2 MG tablet Take 0.2 mg by mouth nightly. DULoxetine (CYMBALTA) 60 MG DR capsule Take 60 mg by mouth once daily. estradiol (ELESTRIN) 0.87 gram/actuation topical gel Place onto the skin. famotidine (PEPCID) 20 MG tablet Take 40 mg by mouth 3 (three) times daily as needed. fenofibrate 160 MG tablet TAKE ONE TABLET AT BEDTIME FLUoxetine (PROZAC) 20 MG capsule Take by mouth once daily. 12 fluticasone (FLONASE) 50 mcg/actuation nasal spray INHALE 2 SPRAYS IN EACH NOSTRIL DAILY FUROsemide (LASIX) 20 MG tablet Take 20 mg by mouth once daily ibuprofen (ADVIL,MOTRIN) 200 MG tablet Take 200 mg by mouth every 6 (six) hours as needed. nebivolol (BYSTOLIC) 10 MG tablet TAKE 1 TABLET (10 MG TOTAL) BY MOUTH DAILY. valsartan (DIOVAN) 40 MG tablet Take 40 mg by mouth 2 (two) times daily.  No current facility-administered medications on file prior to visit.  Family History Problem Relation Age of Onset Deep vein thrombosis (DVT or abnormal blood clot formation) Mother Breast cancer Mother Hyperlipidemia (Elevated cholesterol)  Father Coronary Artery Disease (Blocked  arteries around heart) Father High blood pressure (Hypertension) Father Myocardial Infarction (Heart attack) Father No Known Problems Sister No Known Problems Sister No Known Problems Sister High blood pressure (Hypertension) Brother   Social History  Tobacco Use Smoking Status Former Packs/day: 0.25 Years: 3.00 Additional pack years: 0.00 Total pack years: 0.75 Types: Cigarettes Smokeless Tobacco Former Quit date: 09/15/2013   Social History  Socioeconomic History Marital status: Married Number of children: 2 Occupational History Occupation: Agricultural engineer Comment: Worked for [employer] in the past Tobacco Use Smoking status: Former Packs/day: 0.25 Years: 3.00 Additional pack years: 0.00 Total pack years: 0.75 Types: Cigarettes Smokeless tobacco: Former Quit date: 09/15/2013 Vaping Use Vaping Use: Never used Substance and Sexual Activity Alcohol use: Yes Alcohol/week: 21.0 standard drinks of alcohol Types: 21 Glasses of wine per week Drug use: No Sexual activity: Yes Partners: Male  Objective: There were no vitals filed for this visit. There is no height or weight on file to calculate BMI.  Physical Exam  She appears well on exam  There are no palpable breast masses. There is minimal ecchymosis in the right breast from her biopsy. The nipple areolar complex is normal.  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 From a surgical standpoint I discussed breast surgery with the patient and her son. We discussed breast conservation versus mastectomy when treating breast cancer. She is interested in and is a  candidate for breast conservation. I next discussed proceeding with a radioactive seed guided right breast lumpectomy and sentinel lymph node biopsy. I explained the surgical procedure in detail as well as the need to biopsy lymph nodes for staging purposes. We discussed the risks which includes but is not limited to bleeding, infection, injury to surrounding structures, seroma formation, the need for further surgery if the margins or lymph nodes are positive, cardiopulmonary issues, postoperative recovery, etc. She understands and wishes to proceed with surgery which will be scheduled.

## 2022-01-09 ENCOUNTER — Ambulatory Visit (HOSPITAL_BASED_OUTPATIENT_CLINIC_OR_DEPARTMENT_OTHER)
Admission: RE | Admit: 2022-01-09 | Discharge: 2022-01-09 | Disposition: A | Discharge status: Home / Self Care | Payer: Medicare HMO | Attending: Surgery | Admitting: Surgery

## 2022-01-09 ENCOUNTER — Other Ambulatory Visit: Payer: Self-pay

## 2022-01-09 ENCOUNTER — Encounter (HOSPITAL_BASED_OUTPATIENT_CLINIC_OR_DEPARTMENT_OTHER): Payer: Self-pay | Admitting: Surgery

## 2022-01-09 ENCOUNTER — Ambulatory Visit (HOSPITAL_BASED_OUTPATIENT_CLINIC_OR_DEPARTMENT_OTHER): Payer: Medicare HMO | Admitting: Anesthesiology

## 2022-01-09 ENCOUNTER — Ambulatory Visit
Admission: RE | Admit: 2022-01-09 | Discharge: 2022-01-09 | Disposition: A | Discharge status: Home / Self Care | Payer: Medicare HMO | Source: Ambulatory Visit | Attending: Surgery | Admitting: Surgery

## 2022-01-09 ENCOUNTER — Ambulatory Visit: Payer: Self-pay | Admitting: Genetic Counselor

## 2022-01-09 ENCOUNTER — Encounter (HOSPITAL_BASED_OUTPATIENT_CLINIC_OR_DEPARTMENT_OTHER)
Admission: RE | Disposition: A | Discharge status: Home / Self Care | Payer: Self-pay | Source: Home / Self Care | Attending: Surgery

## 2022-01-09 DIAGNOSIS — Z87891 Personal history of nicotine dependence: Secondary | ICD-10-CM | POA: Insufficient documentation

## 2022-01-09 DIAGNOSIS — F418 Other specified anxiety disorders: Secondary | ICD-10-CM | POA: Diagnosis not present

## 2022-01-09 DIAGNOSIS — C50911 Malignant neoplasm of unspecified site of right female breast: Secondary | ICD-10-CM | POA: Diagnosis not present

## 2022-01-09 DIAGNOSIS — F32A Depression, unspecified: Secondary | ICD-10-CM | POA: Insufficient documentation

## 2022-01-09 DIAGNOSIS — F17210 Nicotine dependence, cigarettes, uncomplicated: Secondary | ICD-10-CM

## 2022-01-09 DIAGNOSIS — I10 Essential (primary) hypertension: Secondary | ICD-10-CM

## 2022-01-09 DIAGNOSIS — Z1379 Encounter for other screening for genetic and chromosomal anomalies: Secondary | ICD-10-CM

## 2022-01-09 DIAGNOSIS — Z79899 Other long term (current) drug therapy: Secondary | ICD-10-CM | POA: Diagnosis not present

## 2022-01-09 DIAGNOSIS — C50411 Malignant neoplasm of upper-outer quadrant of right female breast: Secondary | ICD-10-CM | POA: Diagnosis not present

## 2022-01-09 DIAGNOSIS — F419 Anxiety disorder, unspecified: Secondary | ICD-10-CM | POA: Insufficient documentation

## 2022-01-09 DIAGNOSIS — Z79890 Hormone replacement therapy (postmenopausal): Secondary | ICD-10-CM | POA: Insufficient documentation

## 2022-01-09 DIAGNOSIS — Z853 Personal history of malignant neoplasm of breast: Secondary | ICD-10-CM

## 2022-01-09 DIAGNOSIS — Z170 Estrogen receptor positive status [ER+]: Secondary | ICD-10-CM | POA: Insufficient documentation

## 2022-01-09 DIAGNOSIS — K219 Gastro-esophageal reflux disease without esophagitis: Secondary | ICD-10-CM | POA: Insufficient documentation

## 2022-01-09 DIAGNOSIS — M069 Rheumatoid arthritis, unspecified: Secondary | ICD-10-CM | POA: Insufficient documentation

## 2022-01-09 DIAGNOSIS — E039 Hypothyroidism, unspecified: Secondary | ICD-10-CM | POA: Insufficient documentation

## 2022-01-09 DIAGNOSIS — Z01818 Encounter for other preprocedural examination: Secondary | ICD-10-CM

## 2022-01-09 DIAGNOSIS — J4520 Mild intermittent asthma, uncomplicated: Secondary | ICD-10-CM | POA: Insufficient documentation

## 2022-01-09 HISTORY — PX: BREAST LUMPECTOMY WITH RADIOACTIVE SEED AND SENTINEL LYMPH NODE BIOPSY: SHX6550

## 2022-01-09 HISTORY — PX: BREAST LUMPECTOMY: SHX2

## 2022-01-09 SURGERY — BREAST LUMPECTOMY WITH RADIOACTIVE SEED AND SENTINEL LYMPH NODE BIOPSY
Anesthesia: Regional | Site: Breast | Laterality: Right

## 2022-01-09 MED ORDER — OXYCODONE HCL 5 MG/5ML PO SOLN: 5.0000 mg | Freq: Once | ORAL | Status: DC | PRN

## 2022-01-09 MED ORDER — CIPROFLOXACIN IN D5W 400 MG/200ML IV SOLN
INTRAVENOUS | Status: AC
  Filled 2022-01-09: qty 200

## 2022-01-09 MED ORDER — ROPIVACAINE HCL 5 MG/ML IJ SOLN
INTRAMUSCULAR | Status: DC | PRN
  Administered 2022-01-09: 30 mL via PERINEURAL

## 2022-01-09 MED ORDER — ENSURE PRE-SURGERY PO LIQD: 296.0000 mL | Freq: Once | ORAL | Status: DC

## 2022-01-09 MED ORDER — ACETAMINOPHEN 325 MG PO TABS: 325.0000 mg | ORAL_TABLET | ORAL | Status: DC | PRN

## 2022-01-09 MED ORDER — ACETAMINOPHEN 500 MG PO TABS
ORAL_TABLET | ORAL | Status: AC
  Filled 2022-01-09: qty 2

## 2022-01-09 MED ORDER — FENTANYL CITRATE (PF) 100 MCG/2ML IJ SOLN
INTRAMUSCULAR | Status: DC | PRN
  Administered 2022-01-09: 50 ug via INTRAVENOUS
  Administered 2022-01-09: 25 ug via INTRAVENOUS

## 2022-01-09 MED ORDER — FENTANYL CITRATE (PF) 100 MCG/2ML IJ SOLN
INTRAMUSCULAR | Status: AC
  Filled 2022-01-09: qty 2

## 2022-01-09 MED ORDER — MIDAZOLAM HCL 2 MG/2ML IJ SOLN
2.0000 mg | Freq: Once | INTRAMUSCULAR | Status: AC
  Administered 2022-01-09: 2 mg via INTRAVENOUS

## 2022-01-09 MED ORDER — ONDANSETRON HCL 4 MG/2ML IJ SOLN
INTRAMUSCULAR | Status: AC
  Filled 2022-01-09: qty 2

## 2022-01-09 MED ORDER — BUPIVACAINE-EPINEPHRINE 0.5% -1:200000 IJ SOLN
INTRAMUSCULAR | Status: DC | PRN
  Administered 2022-01-09: 12 mL

## 2022-01-09 MED ORDER — MIDAZOLAM HCL 2 MG/2ML IJ SOLN
INTRAMUSCULAR | Status: AC
  Filled 2022-01-09: qty 2

## 2022-01-09 MED ORDER — FENTANYL CITRATE (PF) 100 MCG/2ML IJ SOLN
100.0000 ug | Freq: Once | INTRAMUSCULAR | Status: AC
  Administered 2022-01-09: 100 ug via INTRAVENOUS

## 2022-01-09 MED ORDER — CIPROFLOXACIN IN D5W 400 MG/200ML IV SOLN
400.0000 mg | INTRAVENOUS | Status: AC
  Administered 2022-01-09: 400 mg via INTRAVENOUS

## 2022-01-09 MED ORDER — OXYCODONE HCL 5 MG PO TABS: 5.0000 mg | ORAL_TABLET | Freq: Once | ORAL | Status: DC | PRN

## 2022-01-09 MED ORDER — ONDANSETRON HCL 4 MG/2ML IJ SOLN
INTRAMUSCULAR | Status: DC | PRN
  Administered 2022-01-09: 4 mg via INTRAVENOUS

## 2022-01-09 MED ORDER — LIDOCAINE HCL (CARDIAC) PF 100 MG/5ML IV SOSY
PREFILLED_SYRINGE | INTRAVENOUS | Status: DC | PRN
  Administered 2022-01-09: 30 mg via INTRAVENOUS

## 2022-01-09 MED ORDER — PROPOFOL 10 MG/ML IV BOLUS
INTRAVENOUS | Status: AC
  Filled 2022-01-09: qty 20

## 2022-01-09 MED ORDER — LACTATED RINGERS IV SOLN: INTRAVENOUS | Status: DC

## 2022-01-09 MED ORDER — DEXAMETHASONE SODIUM PHOSPHATE 10 MG/ML IJ SOLN
INTRAMUSCULAR | Status: DC | PRN
  Administered 2022-01-09: 10 mg

## 2022-01-09 MED ORDER — ONDANSETRON HCL 4 MG/2ML IJ SOLN: 4.0000 mg | Freq: Once | INTRAMUSCULAR | Status: DC | PRN

## 2022-01-09 MED ORDER — DEXAMETHASONE SODIUM PHOSPHATE 10 MG/ML IJ SOLN
INTRAMUSCULAR | Status: AC
  Filled 2022-01-09: qty 1

## 2022-01-09 MED ORDER — CHLORHEXIDINE GLUCONATE CLOTH 2 % EX PADS: 6.0000 | MEDICATED_PAD | Freq: Once | CUTANEOUS | Status: DC

## 2022-01-09 MED ORDER — ACETAMINOPHEN 500 MG PO TABS
1000.0000 mg | ORAL_TABLET | ORAL | Status: AC
  Administered 2022-01-09: 1000 mg via ORAL

## 2022-01-09 MED ORDER — TRAMADOL HCL 50 MG PO TABS: 50.0000 mg | ORAL_TABLET | Freq: Four times a day (QID) | ORAL | 0 refills | Status: DC | PRN

## 2022-01-09 MED ORDER — PROPOFOL 10 MG/ML IV BOLUS
INTRAVENOUS | Status: DC | PRN
  Administered 2022-01-09: 150 mg via INTRAVENOUS

## 2022-01-09 MED ORDER — MIDAZOLAM HCL 5 MG/5ML IJ SOLN
INTRAMUSCULAR | Status: DC | PRN
  Administered 2022-01-09 (×2): 1 mg via INTRAVENOUS

## 2022-01-09 MED ORDER — MEPERIDINE HCL 25 MG/ML IJ SOLN: 6.2500 mg | INTRAMUSCULAR | Status: DC | PRN

## 2022-01-09 MED ORDER — MAGTRACE LYMPHATIC TRACER
INTRAMUSCULAR | Status: DC | PRN
  Administered 2022-01-09: 2 mL via INTRAMUSCULAR

## 2022-01-09 MED ORDER — FENTANYL CITRATE (PF) 100 MCG/2ML IJ SOLN: 25.0000 ug | INTRAMUSCULAR | Status: DC | PRN

## 2022-01-09 MED ORDER — LIDOCAINE 2% (20 MG/ML) 5 ML SYRINGE
INTRAMUSCULAR | Status: AC
  Filled 2022-01-09: qty 5

## 2022-01-09 MED ORDER — ACETAMINOPHEN 160 MG/5ML PO SOLN: 325.0000 mg | ORAL | Status: DC | PRN

## 2022-01-09 SURGICAL SUPPLY — 44 items
ADH SKN CLS APL DERMABOND .7 (GAUZE/BANDAGES/DRESSINGS) ×1
APL PRP STRL LF DISP 70% ISPRP (MISCELLANEOUS) ×1
APPLIER CLIP 9.375 MED OPEN (MISCELLANEOUS) ×1
APR CLP MED 9.3 20 MLT OPN (MISCELLANEOUS) ×1
BLADE SURG 15 STRL LF DISP TIS (BLADE) ×1 IMPLANT
BLADE SURG 15 STRL SS (BLADE) ×1
CANISTER SUCT 1200ML W/VALVE (MISCELLANEOUS) IMPLANT
CHLORAPREP W/TINT 26 (MISCELLANEOUS) ×1 IMPLANT
CLIP APPLIE 9.375 MED OPEN (MISCELLANEOUS) ×1 IMPLANT
CLIP TI WIDE RED SMALL 6 (CLIP) IMPLANT
COVER BACK TABLE 60X90IN (DRAPES) ×1 IMPLANT
COVER MAYO STAND STRL (DRAPES) ×1 IMPLANT
COVER PROBE CYLINDRICAL 5X96 (MISCELLANEOUS) ×1 IMPLANT
DERMABOND ADVANCED .7 DNX12 (GAUZE/BANDAGES/DRESSINGS) ×1 IMPLANT
DRAPE LAPAROSCOPIC ABDOMINAL (DRAPES) ×1 IMPLANT
DRAPE UTILITY XL STRL (DRAPES) ×1 IMPLANT
ELECT REM PT RETURN 9FT ADLT (ELECTROSURGICAL) ×1
ELECTRODE REM PT RTRN 9FT ADLT (ELECTROSURGICAL) ×1 IMPLANT
GAUZE SPONGE 4X4 12PLY STRL LF (GAUZE/BANDAGES/DRESSINGS) IMPLANT
GLOVE SURG SIGNA 7.5 PF LTX (GLOVE) ×1 IMPLANT
GOWN STRL REUS W/ TWL LRG LVL3 (GOWN DISPOSABLE) ×1 IMPLANT
GOWN STRL REUS W/ TWL XL LVL3 (GOWN DISPOSABLE) ×1 IMPLANT
GOWN STRL REUS W/TWL LRG LVL3 (GOWN DISPOSABLE) ×1
GOWN STRL REUS W/TWL XL LVL3 (GOWN DISPOSABLE) ×1
KIT MARKER MARGIN INK (KITS) ×1 IMPLANT
NDL HYPO 25X1 1.5 SAFETY (NEEDLE) ×1 IMPLANT
NDL SAFETY ECLIP 18X1.5 (MISCELLANEOUS) ×1 IMPLANT
NEEDLE HYPO 25X1 1.5 SAFETY (NEEDLE) ×1 IMPLANT
NS IRRIG 1000ML POUR BTL (IV SOLUTION) ×1 IMPLANT
PACK BASIN DAY SURGERY FS (CUSTOM PROCEDURE TRAY) ×1 IMPLANT
PENCIL SMOKE EVACUATOR (MISCELLANEOUS) ×1 IMPLANT
SLEEVE SCD COMPRESS KNEE MED (STOCKING) ×1 IMPLANT
SPIKE FLUID TRANSFER (MISCELLANEOUS) IMPLANT
SPONGE T-LAP 4X18 ~~LOC~~+RFID (SPONGE) ×1 IMPLANT
SUT MNCRL AB 4-0 PS2 18 (SUTURE) ×1 IMPLANT
SUT SILK 2 0 SH (SUTURE) IMPLANT
SUT VIC AB 3-0 SH 27 (SUTURE) ×2
SUT VIC AB 3-0 SH 27X BRD (SUTURE) ×1 IMPLANT
SYR CONTROL 10ML LL (SYRINGE) ×1 IMPLANT
TOWEL GREEN STERILE FF (TOWEL DISPOSABLE) ×1 IMPLANT
TRACER MAGTRACE VIAL (MISCELLANEOUS) IMPLANT
TRAY FAXITRON CT DISP (TRAY / TRAY PROCEDURE) ×1 IMPLANT
TUBE CONNECTING 20X1/4 (TUBING) IMPLANT
YANKAUER SUCT BULB TIP NO VENT (SUCTIONS) IMPLANT

## 2022-01-09 NOTE — Progress Notes (Signed)
HPI:   Cynthia Mccullough. File was previously seen in the [place] clinic due to a personal and family history of cancer and concerns regarding a hereditary predisposition to cancer. Please refer to our prior cancer genetics clinic note for more information regarding our discussion, assessment and recommendations, at the time. Cynthia Mccullough. Hayden recent genetic test results were disclosed to her, as were recommendations warranted by these results. These results and recommendations are discussed in more detail below.  CANCER HISTORY:  Oncology History  Malignant neoplasm of upper-outer quadrant of right breast in female, estrogen receptor positive ([place])  12/03/2021 Imaging    IMPRESSION: Suspicious 0.5 cm mass in the right breast at the 10 o'clock position.   12/19/2021 Initial Diagnosis   Malignant neoplasm of upper-outer quadrant of right breast in female, estrogen receptor positive [facility] Cynthia)    Imaging      Genetic Testing   [facility] CancerNext-Expanded Panel+RNA was Negative. Report date is 12/30/2021.  The CancerNext-Expanded gene panel offered by [facility] and includes sequencing, rearrangement, and RNA analysis for the following 77 genes: AIP, ALK, APC, ATM, AXIN2, BAP1, BARD1, BLM, BMPR1A, BRCA1, BRCA2, BRIP1, CDC73, CDH1, CDK4, CDKN1B, CDKN2A, CHEK2, CTNNA1, DICER1, FANCC, FH, FLCN, GALNT12, KIF1B, LZTR1, MAX, MEN1, MET, MLH1, MSH2, MSH3, MSH6, MUTYH, NBN, NF1, NF2, NTHL1, PALB2, PHOX2B, PMS2, POT1, PRKAR1A, PTCH1, PTEN, RAD51C, RAD51D, RB1, RECQL, RET, SDHA, SDHAF2, SDHB, SDHC, SDHD, SMAD4, SMARCA4, SMARCB1, SMARCE1, STK11, SUFU, TMEM127, TP53, TSC1, TSC2, VHL and XRCC2 (sequencing and deletion/duplication); EGFR, EGLN1, HOXB13, KIT, MITF, PDGFRA, POLD1, and POLE (sequencing only); EPCAM and GREM1 (deletion/duplication only).      FAMILY HISTORY:  We obtained a detailed, 4-generation family history.  Significant diagnoses are listed below:      Family History  Problem  Relation Age of Onset   Breast cancer Mother 64   Heart attack Father     Coronary artery disease Father     Hypertension Father     Diabetes Father     Prostate cancer Father 89 - 107   Breast cancer Maternal Grandmother 52 - 59   Liver cancer Paternal Grandfather         Cynthia Mccullough. [place]'s mother was diagnosed with breast cancer at age 86. Her maternal grandmother was diagnosed with breast cancer in her 7s, she was diagnosed with breast cancer again in her 73s, she is not sure if it was a recurrence or second primary. Her maternal grandmother died due to metastatic breast cancer at age 3.    Cynthia Mccullough. D[place]enedetto father was diagnosed with prostate cancer in his 53s, he died at age 5. Her paternal grandfather was diagnosed with liver cancer, he is deceased. Cynthia Mccullough. Abella is unaware of previous family history of genetic testing for hereditary cancer risks. There is no reported Ashkenazi Jewish ancestry.   GENETIC TEST RESULTS:  The [facility] CancerNext-Expanded Panel found no pathogenic mutations.  The CancerNext-Expanded gene panel offered by [facility] and includes sequencing, rearrangement, and RNA analysis for the following 77 genes: AIP, ALK, APC, ATM, AXIN2, BAP1, BARD1, BLM, BMPR1A, BRCA1, BRCA2, BRIP1, CDC73, CDH1, CDK4, CDKN1B, CDKN2A, CHEK2, CTNNA1, DICER1, FANCC, FH, FLCN, GALNT12, KIF1B, LZTR1, MAX, MEN1, MET, MLH1, MSH2, MSH3, MSH6, MUTYH, NBN, NF1, NF2, NTHL1, PALB2, PHOX2B, PMS2, POT1, PRKAR1A, PTCH1, PTEN, RAD51C, RAD51D, RB1, RECQL, RET, SDHA, SDHAF2, SDHB, SDHC, SDHD, SMAD4, SMARCA4, SMARCB1, SMARCE1, STK11, SUFU, TMEM127, TP53, TSC1, TSC2, VHL and XRCC2 (sequencing and deletion/duplication); EGFR, EGLN1, HOXB13, KIT, MITF, PDGFRA, POLD1, and POLE (sequencing only); EPCAM and GREM1 (deletion/duplication  only).   The test report has been scanned into EPIC and is located under the Molecular Pathology section of the Results Review tab.  A portion of the result report is included below  for reference. Genetic testing reported out on 12/30/2021.       Even though a pathogenic variant was not identified, possible explanations for the cancer in the family may include: There may be no hereditary risk for cancer in the family. The cancers in Cynthia Mccullough. [place] and/or her family may be due to other genetic or environmental factors. There may be a gene mutation in one of these genes that current testing methods cannot detect, but that chance is small. There could be another gene that has not yet been discovered, or that we have not yet tested, that is responsible for the cancer diagnoses in the family.  It is also possible there is a hereditary cause for the cancer in the family that Cynthia Mccullough. [place] did not inherit.  Therefore, it is important to remain in touch with cancer genetics in the future so that we can continue to offer Cynthia Mccullough. Yodice the most up to date genetic testing.   ADDITIONAL GENETIC TESTING:  We discussed with Cynthia Mccullough. Lillo that her genetic testing was fairly extensive.  If there are genes identified to increase cancer risk that can be analyzed in the future, we would be happy to discuss and coordinate this testing at that time.    CANCER SCREENING RECOMMENDATIONS:  Cynthia Mccullough. [place] test result is considered negative (normal).  This means that we have not identified a hereditary cause for her personal and family history of cancer at this time.   An individual's cancer risk and medical management are not determined by genetic test results alone. Overall cancer risk assessment incorporates additional factors, including personal medical history, family history, and any available genetic information that may result in a personalized plan for cancer prevention and surveillance. Therefore, it is recommended she continue to follow the cancer management and screening guidelines provided by her oncology and primary healthcare provider.  RECOMMENDATIONS FOR FAMILY MEMBERS:    Since she did not inherit a mutation in a cancer predisposition gene included on this panel, her children could not have inherited a mutation from her in one of these genes. Individuals in this family might be at some increased risk of developing cancer, over the general population risk, due to the family history of cancer. We recommend women in this family have a yearly mammogram beginning at age 18, or 84 years younger than the earliest onset of cancer, an annual clinical breast exam, and perform monthly breast self-exams.  Other members of the family may still carry a pathogenic variant in one of these genes that Cynthia Mccullough. [place] did not inherit. Based on the family history, we recommend her mother have genetic counseling and testing.   FOLLOW-UP:  Cancer genetics is a rapidly advancing field and it is possible that new genetic tests will be appropriate for her and/or her family members in the future. We encouraged her to remain in contact with cancer genetics on an annual basis so we can update her personal and family histories and let her know of advances in cancer genetics that may benefit this family.   Our contact number was provided. Cynthia Mccullough. [place] questions were answered to her satisfaction, and she knows she is welcome to call us at anytime with additional questions or concerns.   Cynthia Passy, Cynthia Mccullough, Cynthia Mccullough, Cynthia Mccullough [email or web address] (P) [phone]

## 2022-01-09 NOTE — Discharge Instructions (Addendum)
[place] Office Phone Number [phone]  BREAST BIOPSY/ PARTIAL MASTECTOMY: POST OP INSTRUCTIONS  Always review your discharge instruction sheet given to you by the facility where your surgery was performed.  IF YOU HAVE DISABILITY OR FAMILY LEAVE FORMS, YOU MUST BRING THEM TO THE OFFICE FOR PROCESSING.  DO NOT GIVE THEM TO YOUR DOCTOR.  A prescription for pain medication may be given to you upon discharge.  Take your pain medication as prescribed, if needed.  If narcotic pain medicine is not needed, then you may take acetaminophen (Tylenol) or ibuprofen (Advil) as needed. Take your usually prescribed medications unless otherwise directed If you need a refill on your pain medication, please contact your pharmacy.  They will contact our office to request authorization.  Prescriptions will not be filled after 5pm or on week-ends. You should eat very light the first 24 hours after surgery, such as soup, crackers, pudding, etc.  Resume your normal diet the day after surgery. Most patients will experience some swelling and bruising in the breast.  Ice packs and a good support bra will help.  Swelling and bruising can take several days to resolve.  It is common to experience some constipation if taking pain medication after surgery.  Increasing fluid intake and taking a stool softener will usually help or prevent this problem from occurring.  A mild laxative (Milk of Magnesia or Miralax) should be taken according to package directions if there are no bowel movements after 48 hours. Unless discharge instructions indicate otherwise, you may remove your bandages 24-48 hours after surgery, and you may shower at that time.  You may have steri-strips (small skin tapes) in place directly over the incision.  These strips should be left on the skin for 7-10 days.  If your surgeon used skin glue on the incision, you may shower in 24 hours.  The glue will flake off over the next 2-3 weeks.  Any  sutures or staples will be removed at the office during your follow-up visit. ACTIVITIES:  You may resume regular daily activities (gradually increasing) beginning the next day.  Wearing a good support bra or sports bra minimizes pain and swelling.  You may have sexual intercourse when it is comfortable. You may drive when you no longer are taking prescription pain medication, you can comfortably wear a seatbelt, and you can safely maneuver your car and apply brakes. RETURN TO WORK:  ______________________________________________________________________________________ Dennis Bast should see your doctor in the office for a follow-up appointment approximately two weeks after your surgery.  Your doctor's nurse will typically make your follow-up appointment when she calls you with your pathology report.  Expect your pathology report 2-3 business days after your surgery.  You may call to check if you do not hear from [place] after three days. OTHER INSTRUCTIONS: YOU MAY REMOVE THE BINDER TOMORROW AND SHOWER THEN REPLACE THE BINDER ICE PACK AND EXTRA STRENGTH TYLENOL ALSO FOR PAIN NO VIGOROUS ACTIVITY FOR ONE WEEK _______________________________________________________________________________________________ _____________________________________________________________________________________________________________________________________ _____________________________________________________________________________________________________________________________________ _____________________________________________________________________________________________________________________________________  WHEN TO CALL YOUR DOCTOR: Fever over 101.0 Nausea and/or vomiting. Extreme swelling or bruising. Continued bleeding from incision. Increased pain, redness, or drainage from the incision.  The clinic staff is available to answer your questions during regular business hours.  Please don't hesitate to call and ask to  speak to one of the nurses for clinical concerns.  If you have a medical emergency, go to the nearest emergency room or call 911.  A surgeon from [facility] is always on call at  the hospital.  For further questions, please visit [email or web address]    Post Anesthesia Home Care Instructions  Activity: Get plenty of rest for the remainder of the day. A responsible individual must stay with you for 24 hours following the procedure.  For the next 24 hours, DO NOT: -Drive a car -Paediatric nurse -Drink alcoholic beverages -Take any medication unless instructed by your physician -Make any legal decisions or sign important papers.  Meals: Start with liquid foods such as gelatin or soup. Progress to regular foods as tolerated. Avoid greasy, spicy, heavy foods. If nausea and/or vomiting occur, drink only clear liquids until the nausea and/or vomiting subsides. Call your physician if vomiting continues.  Special Instructions/Symptoms: Your throat may feel dry or sore from the anesthesia or the breathing tube placed in your throat during surgery. If this causes discomfort, gargle with warm salt water. The discomfort should disappear within 24 hours.  If you had a scopolamine patch placed behind your ear for the management of post- operative nausea and/or vomiting:  1. The medication in the patch is effective for 72 hours, after which it should be removed.  Wrap patch in a tissue and discard in the trash. Wash hands thoroughly with soap and water. 2. You may remove the patch earlier than 72 hours if you experience unpleasant side effects which may include dry mouth, dizziness or visual disturbances. 3. Avoid touching the patch. Wash your hands with soap and water after contact with the patch.  No tylenol until after 2:40pm if needed today.

## 2022-01-09 NOTE — Op Note (Signed)
   Cynthia Mccullough [facility] 01/09/2022   Pre-op Diagnosis: RIGHT BREAST CANCER     Post-op Diagnosis: same  Procedure(s): RIGHT BREAST LUMPECTOMY WITH RADIOACTIVE SEED AND DEEP RIGHT AXILLARY SENTINEL LYMPH NODE BIOPSY INJECTION OF MAGTRACE FOR LYMPH NODE MAPPING  Surgeon(s): Coralie Keens, MD  Anesthesia: General  Staff:  Circulator: Maurene Capes, RN Scrub Person: Izora Ribas, RN  Estimated Blood Loss: Minimal               Specimens: SENT TO PATH  Indications: This is a 72 year old female who was found to have a small mass in her right breast on screening mammography.  She underwent a biopsy showing invasive breast cancer.  The decision was made to proceed with a radioactive seed guided right breast lumpectomy and sentinel node biopsy  Procedure: The patient was brought to the operating room and identified as a correct patient.  She was placed upon on the operating room table and general anesthesia was induced.  I next injected MAC trace under sterile technique underneath the right nipple areolar complex and massaged the breast.  Her right breast and axilla were then prepped and draped in usual sterile fashion.  Using the neoprobe I located the radioactive seed at the 10:30 position of the right breast 5 cm from the nipple.  I anesthetized the lateral edge of the areola with Marcaine and made a circumareolar incision with a scalpel.  I then dissected down to the breast tissue with the cautery.  I then dissected laterally toward the radioactive seed with the aid of the neoprobe.  Once I reached the area of the seed I then completed the lumpectomy staying widely around the radioactive seed and then going posterior to the seed with the cautery.  Once the lumpectomy specimen was removed I marked all margins with paint.  I performed an x-ray on the specimen confirming the radioactive seed and previous biopsy clip were in the specimen. With the mag trace probe I then identified an  area of increased uptake in the patient's right axilla.  I anesthetized the skin with Marcaine and made an incision with a scalpel.  I then dissected down into the deep right axillary space with the cautery.  I identified an area of fat with increased uptake of the mag trace and excised this area which appeared to contain a small lymph node with the cautery.  I had to control bleeding the axilla with several surgical clips and a 3-0 Vicryl suture to achieve hemostasis.  I palpated the area and found no other enlarged nodes.  At this point hemostasis appeared to be achieved.  I next placed surgical clips around the periphery of the lumpectomy cavity on the breast.  I then closed both incisions with interrupted 3-0 Vicryl sutures and 4-0 Monocryl sutures.  Dermabond was then applied.  The patient tolerated the procedure well.  All the counts were correct at the end of the procedure.  The patient was then extubated in the operating room and taken in stable condition to the recovery room.          Coralie Keens   Date: 01/09/2022  Time: 11:14 AM

## 2022-01-09 NOTE — Interval H&P Note (Signed)
History and Physical Interval Note:no change in H and P  01/09/2022 8:38 AM  Cynthia Mccullough  has presented today for surgery, with the diagnosis of RIGHT BREAST CANCER.  The various methods of treatment have been discussed with the patient and family. After consideration of risks, benefits and other options for treatment, the patient has consented to  Procedure(s): RIGHT BREAST LUMPECTOMY WITH RADIOACTIVE SEED AND SENTINEL LYMPH NODE BIOPSY (Right) as a surgical intervention.  The patient's history has been reviewed, patient examined, no change in status, stable for surgery.  I have reviewed the patient's chart and labs.  Questions were answered to the patient's satisfaction.     Coralie Keens

## 2022-01-09 NOTE — Anesthesia Postprocedure Evaluation (Signed)
Anesthesia Post Note  Patient: Cynthia Mccullough [facility]  Procedure(s) Performed: RIGHT BREAST LUMPECTOMY WITH RADIOACTIVE SEED AND SENTINEL LYMPH NODE BIOPSY (Right: Breast)     Patient location during evaluation: PACU Anesthesia Type: Regional and General Level of consciousness: awake and alert Pain management: pain level controlled Vital Signs Assessment: post-procedure vital signs reviewed and stable Respiratory status: spontaneous breathing, nonlabored ventilation, respiratory function stable and patient connected to nasal cannula oxygen Cardiovascular status: blood pressure returned to baseline and stable Postop Assessment: no apparent nausea or vomiting Anesthetic complications: no  No notable events documented.  Last Vitals:  Vitals:   01/09/22 1145 01/09/22 1218  BP: (!) 158/90 (!) 155/80  Pulse: 69 69  Resp: 12 18  Temp:  36.9 C  SpO2: 95% 93%    Last Pain:  Vitals:   01/09/22 1218  TempSrc: Oral  PainSc: 0-No pain                 Tylan Kinn

## 2022-01-09 NOTE — Progress Notes (Signed)
Assisted Dr. Ambrose Pancoast with right, pectoralis, ultrasound guided block. Side rails up, monitors on throughout procedure. See vital signs in flow sheet. Tolerated Procedure well.

## 2022-01-09 NOTE — Anesthesia Preprocedure Evaluation (Signed)
Anesthesia Evaluation  Patient identified by MRN, date of birth, ID band Patient awake    Reviewed: Allergy & Precautions, H&P , Patient's Chart, lab work & pertinent test results, reviewed documented beta blocker date and time   Airway Mallampati: II  TM Distance: >3 FB Neck ROM: full    Dental no notable dental hx. (+)    Pulmonary Current Smoker, former smoker   Pulmonary exam normal breath sounds clear to auscultation       Cardiovascular hypertension, On Home Beta Blockers, On Medications, Pt. on medications and Pt. on home beta blockers  Rhythm:regular Rate:Normal     Neuro/Psych  PSYCHIATRIC DISORDERS Anxiety Depression       GI/Hepatic   Endo/Other  Hypothyroidism    Renal/GU Renal disease     Musculoskeletal  (+) Arthritis , Rheumatoid disorders,    Abdominal   Peds  Hematology   Anesthesia Other Findings    Reproductive/Obstetrics                             Anesthesia Physical Anesthesia Plan  ASA: 3  Anesthesia Plan: General and Regional   Post-op Pain Management: Regional block*   Induction: Intravenous  PONV Risk Score and Plan: 2 and Ondansetron and Dexamethasone  Airway Management Planned: LMA  Additional Equipment: None  Intra-op Plan:   Post-operative Plan: Extubation in OR  Informed Consent: I have reviewed the patients History and Physical, chart, labs and discussed the procedure including the risks, benefits and alternatives for the proposed anesthesia with the patient or authorized representative who has indicated his/her understanding and acceptance.     Dental Advisory Given and Dental advisory given  Plan Discussed with: CRNA and Anesthesiologist  Anesthesia Plan Comments: (  )        Anesthesia Quick Evaluation

## 2022-01-09 NOTE — Transfer of Care (Signed)
Immediate Anesthesia Transfer of Care Note  Patient: Cynthia Mccullough [facility]  Procedure(s) Performed: RIGHT BREAST LUMPECTOMY WITH RADIOACTIVE SEED AND SENTINEL LYMPH NODE BIOPSY (Right: Breast)  Patient Location: PACU  Anesthesia Type:General  Level of Consciousness: awake, alert , oriented, drowsy, and patient cooperative  Airway & Oxygen Therapy: Patient Spontanous Breathing and Patient connected to face mask oxygen  Post-op Assessment: Report given to RN and Post -op Vital signs reviewed and stable  Post vital signs: Reviewed and stable  Last Vitals:  Vitals Value Taken Time  BP    Temp    Pulse 70 01/09/22 1120  Resp 11 01/09/22 1120  SpO2 97 % 01/09/22 1120  Vitals shown include unvalidated device data.  Last Pain:  Vitals:   01/09/22 0822  TempSrc: Oral  PainSc: 0-No pain      Patients Stated Pain Goal: 5 (24/17/53 0104)  Complications: No notable events documented.

## 2022-01-09 NOTE — Anesthesia Procedure Notes (Signed)
Anesthesia Regional Block: Pectoralis block   Pre-Anesthetic Checklist: , timeout performed,  Correct Patient, Correct Site, Correct Laterality,  Correct Procedure, Correct Position, site marked,  Risks and benefits discussed,  Surgical consent,  Pre-op evaluation,  At surgeon's request and post-op pain management  Laterality: Right  Prep: chloraprep       Needles:  Injection technique: Single-shot  Needle Type: Echogenic Stimulator Needle     Needle Length: 5cm  Needle Gauge: 22     Additional Needles:   Procedures:,,,, ultrasound used (permanent image in chart),,    Narrative:  Start time: 01/09/2022 9:00 AM End time: 01/09/2022 9:05 AM Injection made incrementally with aspirations every 5 mL.  Performed by: Personally  Anesthesiologist: Janeece Riggers, MD  Additional Notes: Functioning IV was confirmed and monitors were applied.  A 61m 22ga Arrow echogenic stimulator needle was used. Sterile prep and drape,hand hygiene and sterile gloves were used. Ultrasound guidance: relevant anatomy identified, needle position confirmed, local anesthetic spread visualized around nerve(s)., vascular puncture avoided.  Image printed for medical record. Negative aspiration and negative test dose prior to incremental administration of local anesthetic. The patient tolerated the procedure well.

## 2022-01-09 NOTE — Anesthesia Procedure Notes (Signed)
Procedure Name: LMA Insertion Date/Time: 01/09/2022 10:14 AM  Performed by: Garrel Ridgel, CRNAPre-anesthesia Checklist: Patient identified, Emergency Drugs available, Suction available and Patient being monitored Patient Re-evaluated:Patient Re-evaluated prior to induction Oxygen Delivery Method: Circle system utilized Preoxygenation: Pre-oxygenation with 100% oxygen Induction Type: IV induction Ventilation: Mask ventilation without difficulty LMA: LMA inserted LMA Size: 4.0 Tube type: Oral Number of attempts: 1 Placement Confirmation: positive ETCO2 and breath sounds checked- equal and bilateral Tube secured with: Tape Dental Injury: Teeth and Oropharynx as per pre-operative assessment

## 2022-01-09 NOTE — Progress Notes (Signed)
Pt took own amlodipine dose just now. Dr Ambrose Pancoast aware of BP, and pt taking own med. BP 202/83.

## 2022-01-10 ENCOUNTER — Encounter (HOSPITAL_BASED_OUTPATIENT_CLINIC_OR_DEPARTMENT_OTHER): Payer: Self-pay | Admitting: Surgery

## 2022-01-12 LAB — SURGICAL PATHOLOGY

## 2022-01-16 ENCOUNTER — Encounter: Payer: Self-pay | Admitting: *Deleted

## 2022-01-16 ENCOUNTER — Telehealth: Payer: Self-pay

## 2022-01-16 NOTE — Telephone Encounter (Signed)
Patient called in asking if she could place her nicotine patch back on after her lumpectomy last week before she starts her radiation and when can she take off the compression bra. I spoke with Dr. Burr Medico she said it was fine to start the patch back now just don't place it near the surgical site and as far as the bra to call the surgeons office about that.

## 2022-01-18 ENCOUNTER — Encounter: Payer: Self-pay | Admitting: Genetic Counselor

## 2022-02-06 NOTE — Therapy (Signed)
OUTPATIENT PHYSICAL THERAPY BREAST CANCER POST OP FOLLOW UP   Patient Name: Cynthia Mccullough MRN: [id number] DOB:06/27/1950, 72 y.o., female Today's Date: 02/07/2022  END OF SESSION:  PT End of Session - 02/07/22 1404     Visit Number 2    Number of Visits 6    Date for PT Re-Evaluation 03/07/22    PT Start Time 9476    PT Stop Time 5465    PT Time Calculation (min) 47 min    Activity Tolerance Patient tolerated treatment well    Behavior During Therapy WFL for tasks assessed/performed             Past Medical History:  Diagnosis Date   Allergic rhinitis    Anxiety    Asthma    Breast cancer ([place])    Depression    DES exposure in utero    Essential tremor    HLD (hyperlipidemia)    HTN (hypertension)    Kidney stones    Panic attacks    Past Surgical History:  Procedure Laterality Date   ABDOMINAL HYSTERECTOMY     BREAST BIOPSY Right 12/13/2021   [place] RT BREAST BX W LOC DEV 1ST LESION IMG BX SPEC US GUIDE 12/13/2021 [facility]   BREAST BIOPSY  01/05/2022   MM RT RADIOACTIVE SEED LOC MAMMO GUIDE 01/05/2022 [facility]   BREAST LUMPECTOMY WITH RADIOACTIVE SEED AND SENTINEL LYMPH NODE BIOPSY Right 01/09/2022   Procedure: RIGHT BREAST LUMPECTOMY WITH RADIOACTIVE SEED AND SENTINEL LYMPH NODE BIOPSY;  Surgeon: Coralie Keens, MD;  Location: [place];  Service: General;  Laterality: Right;   CERVICAL ABLATION     DILATION AND CURETTAGE OF UTERUS     EXCISION MORTON'S NEUROMA Right 01/02/2006   FOOT SURGERY     LAPAROSCOPIC ASSISTED VAGINAL HYSTERECTOMY N/A 04/23/2013   Procedure: LAPAROSCOPIC ASSISTED VAGINAL HYSTERECTOMY;  Surgeon: Cyril Mourning, MD;  Location: [facility];  Service: Gynecology;  Laterality: N/A;   LIPOSUCTION     SALPINGOOPHORECTOMY Bilateral 04/23/2013   Procedure: SALPINGO OOPHORECTOMY;  Surgeon: Cyril Mourning, MD;  Location: [facility];  Service: Gynecology;  Laterality: Bilateral;   Patient Active Problem List    Diagnosis Date Noted   Genetic testing 01/03/2022   Family history of breast cancer 12/21/2021   Malignant neoplasm of upper-outer quadrant of right breast in female, estrogen receptor positive ([place]) 12/19/2021   Rheumatoid arthritis with positive rheumatoid factor ([place])    Hypertensive crisis 03/12/2020   Depression    Hypothyroid    Chronic diarrhea    S/P laparoscopic assisted vaginal hysterectomy ([facility]) 04/23/2013   ALLERGIC CONJUNCTIVITIS 03/26/2007   HYPERTENSION 03/26/2007   Seasonal and perennial allergic rhinitis 03/26/2007   Asthma, intermittent 03/26/2007   URTICARIA 03/26/2007    PCP: Anastasia Pall, MD  REFERRING PROVIDER: Dr. Coralie Keens  REFERRING DIAG: R breast cancer  THERAPY DIAG:  Abnormal posture  At risk for lymphedema  Malignant neoplasm of upper-outer quadrant of right breast in female, estrogen receptor positive ([place])  Rationale for Evaluation and Treatment: Rehabilitation  ONSET DATE: 11/21/21  SUBJECTIVE:  SUBJECTIVE STATEMENT: Every once in a while a get a sharp pain in my breast. My arm and armpit are numb. I am not having any trouble with ROM. I start my radiation on Thursday.   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 It measures 5 mm and is located in the upper outer quadrant. It is ER/PR positive and HER2 negative with a Ki67 of 3%. 01/09/22- R breast lumpectomy and SLNB 0/1  PATIENT GOALS:  Reassess how my recovery is going related to arm function, pain, and swelling.  PAIN:  Are you having pain? No  PRECAUTIONS: Recent Surgery, right UE Lymphedema risk,   ACTIVITY LEVEL / LEISURE: pt has not been exercising   OBJECTIVE:   PATIENT SURVEYS:  QUICK DASH:  Quick Dash -  02/07/22 0001     Open a tight or new jar No difficulty    Do heavy household chores (wash walls, wash floors) Mild difficulty    Carry a shopping bag or briefcase No difficulty    Wash your back Moderate difficulty    Use a knife to cut food No difficulty    Recreational activities in which you take some force or impact through your arm, shoulder, or hand (golf, hammering, tennis) No difficulty    During the past week, to what extent has your arm, shoulder or hand problem interfered with your normal social activities with family, friends, neighbors, or groups? Slightly    During the past week, to what extent has your arm, shoulder or hand problem limited your work or other regular daily activities Not at all    Arm, shoulder, or hand pain. Mild    Tingling (pins and needles) in your arm, shoulder, or hand None    Difficulty Sleeping No difficulty    DASH Score 11.36 %              OBSERVATIONS: Healing SLNB and lumpectomy scars, glue still intact on lumpectomy scar  POSTURE:  Forward head and rounded shoulders posture   UPPER EXTREMITY AROM/PROM:   A/PROM RIGHT   eval   RIGHT 02/07/22  Shoulder extension 48 75  Shoulder flexion 150 163  Shoulder abduction 148 162  Shoulder internal rotation 67 65  Shoulder external rotation 83 81                          (Blank rows = not tested)   A/PROM LEFT   eval  Shoulder extension 47  Shoulder flexion 134  Shoulder abduction 155  Shoulder internal rotation 67  Shoulder external rotation 72                          (Blank rows = not tested)   CERVICAL AROM: All within normal limits:      Percent limited  Flexion WNL  Extension WNL  Right lateral flexion WNL  Left lateral flexion 25% limited  Right rotation WNL  Left rotation 25% limited      UPPER EXTREMITY STRENGTH: WFL   LYMPHEDEMA ASSESSMENTS:    LANDMARK RIGHT   eval RIGHT 02/07/22  10 cm proximal to olecranon process 26 25.1  Olecranon process 24.8 25.4  10  cm proximal to ulnar styloid process 19.9 18.1  Just proximal to ulnar styloid process 15 15.3  Across hand at thumb web space 18 17.9  At base of 2nd digit 6  6  (Blank rows = not tested)   LANDMARK LEFT   eval  10 cm proximal to olecranon process 26  Olecranon process 25.2  10 cm proximal to ulnar styloid process 18.6  Just proximal to ulnar styloid process 14.9  Across hand at thumb web space 18.1  At base of 2nd digit 5.8  (Blank rows = not tested)  Surgery type/Date: 01/09/22 R breast lumpectomy and SLNB (0/1) Number of lymph nodes removed: 1 - negative Current/past treatment (chemo, radiation, hormone therapy): will begin radiation soon Other symptoms:  Heaviness/tightness No Pain No Pitting edema No Infections No Decreased scar mobility Yes Stemmer sign No  PATIENT EDUCATION:  Education details: need for prophylactic compression sleeve for flying, ways to decrease risk of lymphedema, scar mobilization Person educated: Patient Education method: Explanation and Handouts Education comprehension: verbalized understanding  HOME EXERCISE PROGRAM: Reviewed previously given post op HEP.   ASSESSMENT:  CLINICAL IMPRESSION: Pt returns to PT after undergoing a R breast lumpectomy and SLNB on 01/09/22. She had one node removed which was negative. She will be beginning radiation soon. Her shoulder ROM has returned to baseline and pt does not demonstrate any signs of lymphedema. She would benefit from additional skilled PT services to assist pt with obtaining an compression sleeve and gauntlet/glove for prophylactic use sicne she plans to fly several times a year to visit her mother.   Pt will benefit from skilled therapeutic intervention to improve on the following deficits: Decreased knowledge of precautions, impaired UE functional use, pain, decreased ROM, postural dysfunction.   PT treatment/interventions: ADL/Self care home management, Therapeutic exercises, Patient/Family  education, Self Care, Orthotic/Fit training, Manual therapy, and Re-evaluation   GOALS: Goals reviewed with patient? Yes  LONG TERM GOALS:  (STG=LTG)  GOALS Name Target Date  Goal status  1 Pt will demonstrate she has regained full shoulder ROM and function post operatively compared to baselines.  Baseline: 02/07/22  MET  2 Pt will obtain appropriate compression sleeve to help decrease risk of lymphedema when flying. 03/07/22 NEW     PLAN:  PT FREQUENCY/DURATION: 1x/wk for 4 wks  PLAN FOR NEXT SESSION: measure for prophylactic compression sleeve and gauntlet/glove and educate pt how to obtain or go through [employer]  [address], [address]  [place] [address]  [phone]  After Breast Cancer Class It is recommended you attend the ABC class to be educated on lymphedema risk reduction. This class is free of charge and lasts for 1 hour. It is a 1-time class. You will need to download the Webex app either on your phone or computer. We will send you a link the night before or the morning of the class. You should be able to click on that link to join the class. This is not a confidential class. You don't have to turn your camera on, but other participants may be able to see your email address.  Scar massage You can begin gentle scar massage to you incision sites. Gently place one hand on the incision and move the skin (without sliding on the skin) in various directions. Do this for a few minutes and then you can gently massage either coconut oil or vitamin E cream into the scars.  Compression garment You should continue wearing your compression bra until you feel like you no longer have swelling.  Home exercise Program Continue doing the exercises you were given until you feel like you can do them without feeling any  tightness at the end.   Walking Program Studies show that 30 minutes of walking per day (fast enough to elevate your heart rate)  can significantly reduce the risk of a cancer recurrence. If you can't walk due to other medical reasons, we encourage you to find another activity you could do (like a stationary bike or water exercise).  Posture After breast cancer surgery, people frequently sit with rounded shoulders posture because it puts their incisions on slack and feels better. If you sit like this and scar tissue forms in that position, you can become very tight and have pain sitting or standing with good posture. Try to be aware of your posture and sit and stand up tall to heal properly.  Follow up PT: It is recommended you return every 3 months for the first 3 years following surgery to be assessed on the SOZO machine for an L-Dex score. This helps prevent clinically significant lymphedema in 95% of patients. These follow up screens are 10 minute appointments that you are not billed for.  [facility] [place], PT 02/07/2022, 2:57 PM

## 2022-02-06 NOTE — Progress Notes (Signed)
Radiation Oncology         [phone] ________________________________  Name: Cynthia Mccullough        MRN: [id number]  Date of Service: 02/09/2022 DOB: 07/04/1950  WI:[id number], Rebeca Alert, MD  Truitt Merle, MD     REFERRING PHYSICIAN: Truitt Merle, MD   DIAGNOSIS: The encounter diagnosis was Malignant neoplasm of upper-outer quadrant of right breast in female, estrogen receptor positive ([place]).   HISTORY OF PRESENT ILLNESS: Cynthia Mccullough is a 72 y.o. female originally seen in the multidisciplinary breast clinic for a new diagnosis of right breast cancer. The patient was noted to have a screening detected distortion in the right breast.  She underwent further diagnostic workup which identified a mass in the right breast in the upper outer quadrant.  This was located by ultrasound in the 10 o'clock position and measured 5 mm in greatest dimension.  Her axilla was negative for adenopathy.  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  Her cancer was ER/PR positive, HER2 was equivocal by IHC and reflex to negative, and Ki-67 was 3%.  Since her last visit, the patient has undergone right lumpectomy with sentinel lymph node biopsy with Dr. Ninfa Linden on 01/09/2022.  Final pathology showed a grade 2 invasive lobular carcinoma measuring 9 mm.  LCIS was also noted in the specimen.  Her margins were negative and a single axillary lymph node was negative for metastatic disease.  No systemic chemotherapy is planned, the patient is seen to discuss adjuvant radiotherapy.   PREVIOUS RADIATION THERAPY: No   PAST MEDICAL HISTORY:  Past Medical History:  Diagnosis Date   Allergic rhinitis    Anxiety    Asthma    Breast cancer ([place])    Depression    DES exposure in utero    Essential tremor    HLD (hyperlipidemia)    HTN (hypertension)    Kidney stones    Panic attacks        PAST SURGICAL HISTORY: Past Surgical  History:  Procedure Laterality Date   ABDOMINAL HYSTERECTOMY     BREAST BIOPSY Right 12/13/2021   [place] RT BREAST BX W LOC DEV 1ST LESION IMG BX SPEC US GUIDE 12/13/2021 [facility]   BREAST BIOPSY  01/05/2022   MM RT RADIOACTIVE SEED LOC MAMMO GUIDE 01/05/2022 [facility]   BREAST LUMPECTOMY WITH RADIOACTIVE SEED AND SENTINEL LYMPH NODE BIOPSY Right 01/09/2022   Procedure: RIGHT BREAST LUMPECTOMY WITH RADIOACTIVE SEED AND SENTINEL LYMPH NODE BIOPSY;  Surgeon: Coralie Keens, MD;  Location: [facility];  Service: General;  Laterality: Right;   CERVICAL ABLATION     DILATION AND CURETTAGE OF UTERUS     EXCISION MORTON'S NEUROMA Right 01/02/2006   FOOT SURGERY     LAPAROSCOPIC ASSISTED VAGINAL HYSTERECTOMY N/A 04/23/2013   Procedure: LAPAROSCOPIC ASSISTED VAGINAL HYSTERECTOMY;  Surgeon: Cyril Mourning, MD;  Location: [facility];  Service: Gynecology;  Laterality: N/A;   LIPOSUCTION     SALPINGOOPHORECTOMY Bilateral 04/23/2013   Procedure: SALPINGO OOPHORECTOMY;  Surgeon: Cyril Mourning, MD;  Location: [facility];  Service: Gynecology;  Laterality: Bilateral;     FAMILY HISTORY:  Family History  Problem Relation Age of Onset   Breast cancer Mother 33   Heart attack Father    Coronary artery disease Father    Hypertension Father    Diabetes Father    Prostate cancer Father 67 - 33   Breast  cancer Maternal Grandmother 56 - 59   Liver cancer Paternal Grandfather      SOCIAL HISTORY:  reports that she has been smoking cigarettes. She has a 14.00 pack-year smoking history. She has never used smokeless tobacco. She reports current alcohol use of about 7.0 standard drinks of alcohol per week. She reports that she does not use drugs. The patient is widowed and lives in [place]. She is accompanied by her son Thurmond Butts who is a Restaurant manager, fast food. She attends [employer]. Her husband passed away about 6 years ago from esophagus cancer. ***   ALLERGIES:  Amoxicillin, Metronidazole, Penicillins, Sulfonamide derivatives, Cheese, Atorvastatin, and Rosuvastatin   MEDICATIONS:  Current Outpatient Medications  Medication Sig Dispense Refill   albuterol (PROVENTIL HFA;VENTOLIN HFA) 108 (90 Base) MCG/ACT inhaler Inhale 2 puffs into the lungs every 4 (four) hours as needed for wheezing or shortness of breath. 1 Inhaler 2   allopurinol (ZYLOPRIM) 100 MG tablet Take 100 mg by mouth 3 (three) times daily.     ALPRAZolam (XANAX) 0.5 MG tablet Take 0.5-0.75 mg by mouth at bedtime.     amLODipine (NORVASC) 5 MG tablet Take 1 tablet (5 mg total) by mouth daily. 30 tablet 1   BEELITH 362-20 MG TABS tablet Take 1 tablet by mouth daily.     budesonide (ENTOCORT EC) 3 MG 24 hr capsule Take 9 mg by mouth daily.     cetirizine (ZYRTEC) 10 MG tablet Take 10 mg by mouth at bedtime.      doxycycline (ADOXA) 50 MG tablet Take 50 mg by mouth 2 (two) times daily. Pt states on for 7 days. Started on last Wednesday. For sinus infection     fenofibrate 160 MG tablet Take 160 mg by mouth at bedtime.      latanoprost (XALATAN) 0.005 % ophthalmic solution Place 1 drop into both eyes at bedtime.     levothyroxine (SYNTHROID, LEVOTHROID) 50 MCG tablet Take 50 mcg by mouth daily before breakfast.  0   nebivolol (BYSTOLIC) 10 MG tablet Take 10 mg by mouth at bedtime.     nystatin (MYCOSTATIN/NYSTOP) powder Apply 1 Application topically 2 (two) times daily. To skin rashes underneath breasts, as needed 30 g 1   pantoprazole (PROTONIX) 40 MG tablet Take 40 mg by mouth daily.     traMADol (ULTRAM) 50 MG tablet Take 1 tablet (50 mg total) by mouth every 6 (six) hours as needed. 25 tablet 0   traZODone (DESYREL) 100 MG tablet Take 1 tablet (100 mg total) by mouth at bedtime. 30 tablet 0   Vilazodone HCl (VIIBRYD) 40 MG TABS Take 40 mg by mouth daily.      zaleplon (SONATA) 10 MG capsule Take 2 capsules by mouth at bedtime.     No current facility-administered medications for this  visit.     REVIEW OF SYSTEMS: On review of systems, the patient reports that she is ***     PHYSICAL EXAM:  Wt Readings from Last 3 Encounters:  01/09/22 178 lb 5.6 oz (80.9 kg)  12/21/21 182 lb (82.6 kg)  03/13/20 189 lb (85.7 kg)   Temp Readings from Last 3 Encounters:  01/09/22 98.4 F (36.9 C) (Oral)  12/21/21 97.8 F (36.6 C) (Tympanic)  03/14/20 98.8 F (37.1 C) (Oral)   BP Readings from Last 3 Encounters:  01/09/22 (!) 155/80  12/21/21 (!) 187/83  03/14/20 (!) 148/80   Pulse Readings from Last 3 Encounters:  01/09/22 69  12/21/21 75  03/14/20 67    In general this is a well appearing caucasian female in no acute distress. She's alert and oriented x4 and appropriate throughout the examination. Cardiopulmonary assessment is negative for acute distress and she exhibits normal effort. Bilateral breast exam is deferred.    ECOG = ***  0 - Asymptomatic (Fully active, able to carry on all predisease activities without restriction)  1 - Symptomatic but completely ambulatory (Restricted in physically strenuous activity but ambulatory and able to carry out work of a light or sedentary nature. For example, light housework, office work)  2 - Symptomatic, <50% in bed during the day (Ambulatory and capable of all self care but unable to carry out any work activities. Up and about more than 50% of waking hours)  3 - Symptomatic, >50% in bed, but not bedbound (Capable of only limited self-care, confined to bed or chair 50% or more of waking hours)  4 - Bedbound (Completely disabled. Cannot carry on any self-care. Totally confined to bed or chair)  5 - Death   Eustace Pen MM, Creech RH, Tormey DC, et al. [phone]). "Toxicity and response criteria of the [facility]". [place] Oncol. 5 (6): 649-55    LABORATORY DATA:  Lab Results  Component Value Date   WBC 5.7 12/21/2021   HGB 15.2 (H) 12/21/2021   HCT 44.4 12/21/2021   MCV 100.7 (H) 12/21/2021    PLT 234 12/21/2021   Lab Results  Component Value Date   NA 135 01/05/2022   K 3.9 01/05/2022   CL 100 01/05/2022   CO2 27 01/05/2022   Lab Results  Component Value Date   ALT 21 12/21/2021   AST 20 12/21/2021   ALKPHOS 34 (L) 12/21/2021   BILITOT 0.7 12/21/2021      RADIOGRAPHY: MM Breast Surgical Specimen  Result Date: 01/09/2022 CLINICAL DATA:  Right lumpectomy for invasive lobular carcinoma, marked a ribbon shaped biopsy marker clip and radioactive seed. EXAM: SPECIMEN RADIOGRAPH OF THE RIGHT BREAST COMPARISON:  Previous exam(s). FINDINGS: Status post excision of the right breast. The radioactive seed and ribbon shaped biopsy marker clip are present, completely intact, and were marked for pathology. IMPRESSION: Specimen radiograph of the right breast. Electronically Signed   By: Claudie Revering M.D.   On: 01/09/2022 10:47      IMPRESSION/PLAN: 1. Stage IA, pT1bN0M0, grade 2, ER/PR positive invasive ductal carcinoma of the right breast. Dr. Lisbeth Renshaw has reviewed her final pathology findings and we reviewed the nature of early stage right breast disease.  She has done well since surgery and there are no plans for systemic chemotherapy.  Dr. Lisbeth Renshaw would recommend external radiotherapy to the breast  to reduce risks of local recurrence followed by antiestrogen therapy. We discussed the risks, benefits, short, and long term effects of radiotherapy, as well as the curative intent, and the patient is interested in proceeding.  We reviewed the delivery and logistics of radiotherapy and Dr. Lisbeth Renshaw recommends 4  weeks of radiotherapy to the right breast. Written consent is obtained and placed in the chart, a copy was provided to the patient. She will simulate today.    In a visit lasting *** minutes, greater than 50% of the time was spent face to face reviewing her case, as well as in preparation of, discussing, and coordinating the patient's care.      Carola Rhine, [facility]    **Disclaimer:  This note was dictated with voice recognition software. Similar sounding words can inadvertently  be transcribed and this note may contain transcription errors which may not have been corrected upon publication of note.**

## 2022-02-07 ENCOUNTER — Encounter: Payer: Self-pay | Admitting: Physical Therapy

## 2022-02-07 ENCOUNTER — Ambulatory Visit: Payer: Medicare HMO | Attending: Surgery | Admitting: Physical Therapy

## 2022-02-07 DIAGNOSIS — Z170 Estrogen receptor positive status [ER+]: Secondary | ICD-10-CM | POA: Diagnosis present

## 2022-02-07 DIAGNOSIS — C50411 Malignant neoplasm of upper-outer quadrant of right female breast: Secondary | ICD-10-CM | POA: Diagnosis present

## 2022-02-07 DIAGNOSIS — R293 Abnormal posture: Secondary | ICD-10-CM | POA: Insufficient documentation

## 2022-02-07 DIAGNOSIS — Z9189 Other specified personal risk factors, not elsewhere classified: Secondary | ICD-10-CM | POA: Insufficient documentation

## 2022-02-09 ENCOUNTER — Encounter: Payer: Self-pay | Admitting: Radiation Oncology

## 2022-02-09 ENCOUNTER — Ambulatory Visit
Admission: RE | Admit: 2022-02-09 | Discharge: 2022-02-09 | Disposition: A | Discharge status: Home / Self Care | Payer: Medicare HMO | Source: Ambulatory Visit | Attending: Radiation Oncology | Admitting: Radiation Oncology

## 2022-02-09 ENCOUNTER — Ambulatory Visit: Admission: RE | Admit: 2022-02-09 | Payer: Medicare HMO | Source: Ambulatory Visit | Admitting: Radiation Oncology

## 2022-02-09 ENCOUNTER — Other Ambulatory Visit: Payer: Self-pay

## 2022-02-09 VITALS — BP 159/81 | HR 75 | Temp 98.1°F | Resp 20 | Ht 68.0 in | Wt 180.8 lb

## 2022-02-09 DIAGNOSIS — Z87442 Personal history of urinary calculi: Secondary | ICD-10-CM | POA: Insufficient documentation

## 2022-02-09 DIAGNOSIS — E785 Hyperlipidemia, unspecified: Secondary | ICD-10-CM | POA: Diagnosis not present

## 2022-02-09 DIAGNOSIS — F17210 Nicotine dependence, cigarettes, uncomplicated: Secondary | ICD-10-CM | POA: Diagnosis not present

## 2022-02-09 DIAGNOSIS — C50411 Malignant neoplasm of upper-outer quadrant of right female breast: Secondary | ICD-10-CM

## 2022-02-09 DIAGNOSIS — Z79899 Other long term (current) drug therapy: Secondary | ICD-10-CM | POA: Diagnosis not present

## 2022-02-09 DIAGNOSIS — Z79890 Hormone replacement therapy (postmenopausal): Secondary | ICD-10-CM | POA: Insufficient documentation

## 2022-02-09 DIAGNOSIS — Z510 Encounter for antineoplastic radiation therapy: Secondary | ICD-10-CM | POA: Insufficient documentation

## 2022-02-09 DIAGNOSIS — I10 Essential (primary) hypertension: Secondary | ICD-10-CM | POA: Diagnosis not present

## 2022-02-09 DIAGNOSIS — Z170 Estrogen receptor positive status [ER+]: Secondary | ICD-10-CM | POA: Insufficient documentation

## 2022-02-09 NOTE — Progress Notes (Signed)
Nursing interview for Malignant neoplasm of upper-outer quadrant of right breast in female, estrogen receptor positive ([place]).  Patient identity verified. Patient reports numbness of RT axilla and occasional sharp pains 2/10. No other issues reported at this time.  Meaningful use compete. Postmenopausal.  BP (!) 159/81 (BP Location: Left Arm, Patient Position: Sitting, Cuff Size: Normal)   Pulse 75   Temp 98.1 F (36.7 C) (Temporal)   Resp 20   Ht '5\' 8"'$  (1.727 m)   Wt 180 lb 12.8 oz (82 kg)   SpO2 98%   BMI 27.49 kg/m   This concludes the interview.   Leandra Kern, LPN

## 2022-02-15 DIAGNOSIS — Z510 Encounter for antineoplastic radiation therapy: Secondary | ICD-10-CM | POA: Diagnosis not present

## 2022-02-16 ENCOUNTER — Encounter: Payer: Self-pay | Admitting: Physical Therapy

## 2022-02-16 ENCOUNTER — Ambulatory Visit: Payer: Medicare HMO | Admitting: Physical Therapy

## 2022-02-16 DIAGNOSIS — Z170 Estrogen receptor positive status [ER+]: Secondary | ICD-10-CM

## 2022-02-16 DIAGNOSIS — R293 Abnormal posture: Secondary | ICD-10-CM

## 2022-02-16 DIAGNOSIS — Z9189 Other specified personal risk factors, not elsewhere classified: Secondary | ICD-10-CM

## 2022-02-16 NOTE — Therapy (Addendum)
 OUTPATIENT PHYSICAL THERAPY BREAST CANCER POST OP FOLLOW UP   Patient Name: Cynthia Mccullough MRN: [id number] DOB:1951/01/01, 72 y.o., female Today's Date: 02/16/2022  END OF SESSION:  PT End of Session - 02/16/22 1307     Visit Number 3    Number of Visits 6    Date for PT Re-Evaluation 03/07/22    PT Start Time 1305    PT Stop Time 1342    PT Time Calculation (min) 37 min    Activity Tolerance Patient tolerated treatment well    Behavior During Therapy WFL for tasks assessed/performed             Past Medical History:  Diagnosis Date   Allergic rhinitis    Anxiety    Asthma    Breast cancer ([facility])    Depression    DES exposure in utero    Essential tremor    HLD (hyperlipidemia)    HTN (hypertension)    Kidney stones    Panic attacks    Past Surgical History:  Procedure Laterality Date   ABDOMINAL HYSTERECTOMY     BREAST BIOPSY Right 12/13/2021   [place] RT BREAST BX W LOC DEV 1ST LESION IMG BX SPEC US GUIDE 12/13/2021 [facility]   BREAST BIOPSY  01/05/2022   MM RT RADIOACTIVE SEED LOC MAMMO GUIDE 01/05/2022 [facility]   BREAST LUMPECTOMY WITH RADIOACTIVE SEED AND SENTINEL LYMPH NODE BIOPSY Right 01/09/2022   Procedure: RIGHT BREAST LUMPECTOMY WITH RADIOACTIVE SEED AND SENTINEL LYMPH NODE BIOPSY;  Surgeon: Abigail Miyamoto, MD;  Location: [facility];  Service: General;  Laterality: Right;   CERVICAL ABLATION     DILATION AND CURETTAGE OF UTERUS     EXCISION MORTON'S NEUROMA Right 01/02/2006   FOOT SURGERY     LAPAROSCOPIC ASSISTED VAGINAL HYSTERECTOMY N/A 04/23/2013   Procedure: LAPAROSCOPIC ASSISTED VAGINAL HYSTERECTOMY;  Surgeon: Jeani Hawking, MD;  Location: [facility];  Service: Gynecology;  Laterality: N/A;   LIPOSUCTION     SALPINGOOPHORECTOMY Bilateral 04/23/2013   Procedure: SALPINGO OOPHORECTOMY;  Surgeon: Jeani Hawking, MD;  Location: [facility];  Service: Gynecology;  Laterality: Bilateral;   Patient Active Problem List    Diagnosis Date Noted   Genetic testing 01/03/2022   Family history of breast cancer 12/21/2021   Malignant neoplasm of upper-outer quadrant of right breast in female, estrogen receptor positive ([facility]) 12/19/2021   Rheumatoid arthritis with positive rheumatoid factor ([facility])    Hypertensive crisis 03/12/2020   Depression    Hypothyroid    Chronic diarrhea    S/P laparoscopic assisted vaginal hysterectomy (LAVH) 04/23/2013   ALLERGIC CONJUNCTIVITIS 03/26/2007   HYPERTENSION 03/26/2007   Seasonal and perennial allergic rhinitis 03/26/2007   Asthma, intermittent 03/26/2007   URTICARIA 03/26/2007    PCP: Antony Haste, MD  REFERRING PROVIDER: Dr. Abigail Miyamoto  REFERRING DIAG: R breast cancer  THERAPY DIAG:  Abnormal posture  At risk for lymphedema  Malignant neoplasm of upper-outer quadrant of right breast in female, estrogen receptor positive ([facility])  Rationale for Evaluation and Treatment: Rehabilitation  ONSET DATE: 11/21/21  SUBJECTIVE:  SUBJECTIVE STATEMENT: I am still real numb under my armpit.   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 It measures 5 mm and is located in the upper outer quadrant. It is ER/PR positive and HER2 negative with a Ki67 of 3%. 01/09/22- R breast lumpectomy and SLNB 0/1  PATIENT GOALS:  Reassess how my recovery is going related to arm function, pain, and swelling.  PAIN:  Are you having pain? No  PRECAUTIONS: Recent Surgery, right UE Lymphedema risk,   ACTIVITY LEVEL / LEISURE: pt has not been exercising   OBJECTIVE:   PATIENT SURVEYS:  QUICK DASH:     OBSERVATIONS: Healing SLNB and lumpectomy scars, glue still intact on lumpectomy scar  POSTURE:  Forward head and rounded  shoulders posture   UPPER EXTREMITY AROM/PROM:   A/PROM RIGHT   eval   RIGHT 02/07/22  Shoulder extension 48 75  Shoulder flexion 150 163  Shoulder abduction 148 162  Shoulder internal rotation 67 65  Shoulder external rotation 83 81                          (Blank rows = not tested)   A/PROM LEFT   eval  Shoulder extension 47  Shoulder flexion 134  Shoulder abduction 155  Shoulder internal rotation 67  Shoulder external rotation 72                          (Blank rows = not tested)   CERVICAL AROM: All within normal limits:      Percent limited  Flexion WNL  Extension WNL  Right lateral flexion WNL  Left lateral flexion 25% limited  Right rotation WNL  Left rotation 25% limited      UPPER EXTREMITY STRENGTH: WFL   LYMPHEDEMA ASSESSMENTS:    LANDMARK RIGHT   eval RIGHT 02/07/22  10 cm proximal to olecranon process 26 25.1  Olecranon process 24.8 25.4  10 cm proximal to ulnar styloid process 19.9 18.1  Just proximal to ulnar styloid process 15 15.3  Across hand at thumb web space 18 17.9  At base of 2nd digit 6 6  (Blank rows = not tested)   LANDMARK LEFT   eval  10 cm proximal to olecranon process 26  Olecranon process 25.2  10 cm proximal to ulnar styloid process 18.6  Just proximal to ulnar styloid process 14.9  Across hand at thumb web space 18.1  At base of 2nd digit 5.8  (Blank rows = not tested)  Surgery type/Date: 01/09/22 R breast lumpectomy and SLNB (0/1) Number of lymph nodes removed: 1 - negative Current/past treatment (chemo, radiation, hormone therapy): will begin radiation soon Other symptoms:  Heaviness/tightness No Pain No Pitting edema No Infections No Decreased scar mobility Yes Stemmer sign No  PATIENT EDUCATION:  Education details: need for prophylactic compression sleeve for flying, ways to decrease risk of lymphedema, scar mobilization Person educated: Patient Education method: Explanation and Handouts Education comprehension:  verbalized understanding  HOME EXERCISE PROGRAM: Reviewed previously given post op HEP.  TREATMENT PERFORMED TODAY: 02/16/22: Measured pt for prophylactic sleeve and gauntlet. Her arm was too long for the [facility]. She tried on the size that fit but it was too short. She was measured for a Juzo Soft sleeve and glove size I max and small and she fit perfectly in this and it came in long. Educated  pt to wear the sleeve a few hours each day for a couple of days prior to her trip and after. Issued info to pt on how to obtain compression garments from [email or web address]  ASSESSMENT:  CLINICAL IMPRESSION: Pt was measured for prophylactic compression sleeve and gauntlet. She ended up fitting in to a Juzo soft sleeve and gauntlet. Issued info for pt to obtain. Educated pt how long to wear the sleeve for a few days prior to the trip and after her flight.   Pt will benefit from skilled therapeutic intervention to improve on the following deficits: Decreased knowledge of precautions, impaired UE functional use, pain, decreased ROM, postural dysfunction.   PT treatment/interventions: ADL/Self care home management, Therapeutic exercises, Patient/Family education, Self Care, Orthotic/Fit training, Manual therapy, and Re-evaluation   GOALS: Goals reviewed with patient? Yes  LONG TERM GOALS:  (STG=LTG)  GOALS Name Target Date  Goal status  1 Pt will demonstrate she has regained full shoulder ROM and function post operatively compared to baselines.  Baseline: 02/07/22  MET  2 Pt will obtain appropriate compression sleeve to help decrease risk of lymphedema when flying. 03/07/22 NEW     PLAN:  PT FREQUENCY/DURATION: 1x/wk for 4 wks  PLAN FOR NEXT SESSION: assess sleeve and d/c if it fits appropriately   [facility]  [address]  [place] [address]  [phone]  After Breast Cancer Class It is recommended you attend the ABC class to be educated on  lymphedema risk reduction. This class is free of charge and lasts for 1 hour. It is a 1-time class. You will need to download the Webex app either on your phone or computer. We will send you a link the night before or the morning of the class. You should be able to click on that link to join the class. This is not a confidential class. You don't have to turn your camera on, but other participants may be able to see your email address.  Scar massage You can begin gentle scar massage to you incision sites. Gently place one hand on the incision and move the skin (without sliding on the skin) in various directions. Do this for a few minutes and then you can gently massage either coconut oil or vitamin E cream into the scars.  Compression garment You should continue wearing your compression bra until you feel like you no longer have swelling.  Home exercise Program Continue doing the exercises you were given until you feel like you can do them without feeling any tightness at the end.   Walking Program Studies show that 30 minutes of walking per day (fast enough to elevate your heart rate) can significantly reduce the risk of a cancer recurrence. If you can't walk due to other medical reasons, we encourage you to find another activity you could do (like a stationary bike or water exercise).  Posture After breast cancer surgery, people frequently sit with rounded shoulders posture because it puts their incisions on slack and feels better. If you sit like this and scar tissue forms in that position, you can become very tight and have pain sitting or standing with good posture. Try to be aware of your posture and sit and stand up tall to heal properly.  Follow up PT: It is recommended you return every 3 months for the first 3 years following surgery to be assessed on the SOZO machine for an L-Dex score. This helps prevent clinically significant lymphedema in 95%  of patients. These follow up screens are 10  minute appointments that you are not billed for.  Milagros Loll [place], PT 02/16/2022, 1:47 PM   PHYSICAL THERAPY DISCHARGE SUMMARY  Visits from Start of Care: 3  Current functional level related to goals / functional outcomes: See above   Remaining deficits: See above   Education / Equipment: HEP, compression garments, lymphedema info   Patient agrees to discharge. Patient goals were partially met. Patient is being discharged due to not returning since the last visit.  Milagros Loll [place], [place] 03/21/23 9:06 AM

## 2022-02-17 ENCOUNTER — Encounter: Payer: Self-pay | Admitting: *Deleted

## 2022-02-17 ENCOUNTER — Telehealth: Payer: Self-pay | Admitting: Hematology

## 2022-02-17 NOTE — Telephone Encounter (Signed)
Per 2/16 reached out to patient to schedule , patient aware.

## 2022-02-20 ENCOUNTER — Other Ambulatory Visit: Payer: Self-pay

## 2022-02-20 ENCOUNTER — Ambulatory Visit
Admission: RE | Admit: 2022-02-20 | Discharge: 2022-02-20 | Disposition: A | Discharge status: Home / Self Care | Payer: Medicare HMO | Source: Ambulatory Visit | Attending: Radiation Oncology | Admitting: Radiation Oncology

## 2022-02-20 DIAGNOSIS — Z510 Encounter for antineoplastic radiation therapy: Secondary | ICD-10-CM | POA: Diagnosis not present

## 2022-02-20 LAB — RAD ONC ARIA SESSION SUMMARY
Course Elapsed Days: 0
Plan Fractions Treated to Date: 1
Plan Prescribed Dose Per Fraction: 2.66 Gy
Plan Total Fractions Prescribed: 16
Plan Total Prescribed Dose: 42.56 Gy
Reference Point Dosage Given to Date: 2.66 Gy
Reference Point Session Dosage Given: 2.66 Gy
Session Number: 1

## 2022-02-21 ENCOUNTER — Encounter (HOSPITAL_COMMUNITY): Payer: Self-pay

## 2022-02-21 ENCOUNTER — Other Ambulatory Visit: Payer: Self-pay

## 2022-02-21 ENCOUNTER — Ambulatory Visit
Admission: RE | Admit: 2022-02-21 | Discharge: 2022-02-21 | Disposition: A | Discharge status: Home / Self Care | Payer: Medicare HMO | Source: Ambulatory Visit | Attending: Radiation Oncology | Admitting: Radiation Oncology

## 2022-02-21 DIAGNOSIS — Z510 Encounter for antineoplastic radiation therapy: Secondary | ICD-10-CM | POA: Diagnosis not present

## 2022-02-21 LAB — RAD ONC ARIA SESSION SUMMARY
Course Elapsed Days: 1
Plan Fractions Treated to Date: 2
Plan Prescribed Dose Per Fraction: 2.66 Gy
Plan Total Fractions Prescribed: 16
Plan Total Prescribed Dose: 42.56 Gy
Reference Point Dosage Given to Date: 5.32 Gy
Reference Point Session Dosage Given: 2.66 Gy
Session Number: 2

## 2022-02-22 ENCOUNTER — Ambulatory Visit
Admission: RE | Admit: 2022-02-22 | Discharge: 2022-02-22 | Disposition: A | Discharge status: Home / Self Care | Payer: Medicare HMO | Source: Ambulatory Visit | Attending: Radiation Oncology | Admitting: Radiation Oncology

## 2022-02-22 ENCOUNTER — Other Ambulatory Visit: Payer: Self-pay

## 2022-02-22 DIAGNOSIS — Z510 Encounter for antineoplastic radiation therapy: Secondary | ICD-10-CM | POA: Diagnosis not present

## 2022-02-22 LAB — RAD ONC ARIA SESSION SUMMARY
Course Elapsed Days: 2
Plan Fractions Treated to Date: 3
Plan Prescribed Dose Per Fraction: 2.66 Gy
Plan Total Fractions Prescribed: 16
Plan Total Prescribed Dose: 42.56 Gy
Reference Point Dosage Given to Date: 7.98 Gy
Reference Point Session Dosage Given: 2.66 Gy
Session Number: 3

## 2022-02-23 ENCOUNTER — Ambulatory Visit
Admission: RE | Admit: 2022-02-23 | Discharge: 2022-02-23 | Disposition: A | Discharge status: Home / Self Care | Payer: Medicare HMO | Source: Ambulatory Visit | Attending: Radiation Oncology | Admitting: Radiation Oncology

## 2022-02-23 ENCOUNTER — Other Ambulatory Visit: Payer: Self-pay

## 2022-02-23 DIAGNOSIS — Z510 Encounter for antineoplastic radiation therapy: Secondary | ICD-10-CM | POA: Diagnosis not present

## 2022-02-23 LAB — RAD ONC ARIA SESSION SUMMARY
Course Elapsed Days: 3
Plan Fractions Treated to Date: 4
Plan Prescribed Dose Per Fraction: 2.66 Gy
Plan Total Fractions Prescribed: 16
Plan Total Prescribed Dose: 42.56 Gy
Reference Point Dosage Given to Date: 10.64 Gy
Reference Point Session Dosage Given: 2.66 Gy
Session Number: 4

## 2022-02-24 ENCOUNTER — Other Ambulatory Visit: Payer: Self-pay

## 2022-02-24 ENCOUNTER — Ambulatory Visit
Admission: RE | Admit: 2022-02-24 | Discharge: 2022-02-24 | Disposition: A | Discharge status: Home / Self Care | Payer: Medicare HMO | Source: Ambulatory Visit | Attending: Radiation Oncology | Admitting: Radiation Oncology

## 2022-02-24 DIAGNOSIS — Z510 Encounter for antineoplastic radiation therapy: Secondary | ICD-10-CM | POA: Diagnosis not present

## 2022-02-24 DIAGNOSIS — C50411 Malignant neoplasm of upper-outer quadrant of right female breast: Secondary | ICD-10-CM

## 2022-02-24 LAB — RAD ONC ARIA SESSION SUMMARY
Course Elapsed Days: 4
Plan Fractions Treated to Date: 5
Plan Prescribed Dose Per Fraction: 2.66 Gy
Plan Total Fractions Prescribed: 16
Plan Total Prescribed Dose: 42.56 Gy
Reference Point Dosage Given to Date: 13.3 Gy
Reference Point Session Dosage Given: 2.66 Gy
Session Number: 5

## 2022-02-24 MED ORDER — RADIAPLEXRX EX GEL: Freq: Once | CUTANEOUS | Status: AC

## 2022-02-24 MED ORDER — ALRA NON-METALLIC DEODORANT (RAD-ONC)
1.0000 | Freq: Once | TOPICAL | Status: AC
  Administered 2022-02-24: 1 via TOPICAL

## 2022-02-27 ENCOUNTER — Ambulatory Visit
Admission: RE | Admit: 2022-02-27 | Discharge: 2022-02-27 | Disposition: A | Discharge status: Home / Self Care | Payer: Medicare HMO | Source: Ambulatory Visit | Attending: Radiation Oncology | Admitting: Radiation Oncology

## 2022-02-27 ENCOUNTER — Other Ambulatory Visit: Payer: Self-pay

## 2022-02-27 DIAGNOSIS — Z510 Encounter for antineoplastic radiation therapy: Secondary | ICD-10-CM | POA: Diagnosis not present

## 2022-02-27 LAB — RAD ONC ARIA SESSION SUMMARY
Course Elapsed Days: 7
Plan Fractions Treated to Date: 6
Plan Prescribed Dose Per Fraction: 2.66 Gy
Plan Total Fractions Prescribed: 16
Plan Total Prescribed Dose: 42.56 Gy
Reference Point Dosage Given to Date: 15.96 Gy
Reference Point Session Dosage Given: 2.66 Gy
Session Number: 6

## 2022-02-28 ENCOUNTER — Ambulatory Visit
Admission: RE | Admit: 2022-02-28 | Discharge: 2022-02-28 | Disposition: A | Discharge status: Home / Self Care | Payer: Medicare HMO | Source: Ambulatory Visit | Attending: Radiation Oncology | Admitting: Radiation Oncology

## 2022-02-28 ENCOUNTER — Other Ambulatory Visit: Payer: Self-pay

## 2022-02-28 DIAGNOSIS — Z510 Encounter for antineoplastic radiation therapy: Secondary | ICD-10-CM | POA: Diagnosis not present

## 2022-02-28 LAB — RAD ONC ARIA SESSION SUMMARY
Course Elapsed Days: 8
Plan Fractions Treated to Date: 7
Plan Prescribed Dose Per Fraction: 2.66 Gy
Plan Total Fractions Prescribed: 16
Plan Total Prescribed Dose: 42.56 Gy
Reference Point Dosage Given to Date: 18.62 Gy
Reference Point Session Dosage Given: 2.66 Gy
Session Number: 7

## 2022-03-01 ENCOUNTER — Other Ambulatory Visit: Payer: Self-pay

## 2022-03-01 ENCOUNTER — Ambulatory Visit
Admission: RE | Admit: 2022-03-01 | Discharge: 2022-03-01 | Disposition: A | Discharge status: Home / Self Care | Payer: Medicare HMO | Source: Ambulatory Visit | Attending: Radiation Oncology | Admitting: Radiation Oncology

## 2022-03-01 DIAGNOSIS — Z510 Encounter for antineoplastic radiation therapy: Secondary | ICD-10-CM | POA: Diagnosis not present

## 2022-03-01 LAB — RAD ONC ARIA SESSION SUMMARY
Course Elapsed Days: 9
Plan Fractions Treated to Date: 8
Plan Prescribed Dose Per Fraction: 2.66 Gy
Plan Total Fractions Prescribed: 16
Plan Total Prescribed Dose: 42.56 Gy
Reference Point Dosage Given to Date: 21.28 Gy
Reference Point Session Dosage Given: 2.66 Gy
Session Number: 8

## 2022-03-02 ENCOUNTER — Ambulatory Visit
Admission: RE | Admit: 2022-03-02 | Discharge: 2022-03-02 | Disposition: A | Discharge status: Home / Self Care | Payer: Medicare HMO | Source: Ambulatory Visit | Attending: Radiation Oncology | Admitting: Radiation Oncology

## 2022-03-02 ENCOUNTER — Other Ambulatory Visit: Payer: Self-pay

## 2022-03-02 DIAGNOSIS — Z510 Encounter for antineoplastic radiation therapy: Secondary | ICD-10-CM | POA: Diagnosis not present

## 2022-03-02 LAB — RAD ONC ARIA SESSION SUMMARY
Course Elapsed Days: 10
Plan Fractions Treated to Date: 9
Plan Prescribed Dose Per Fraction: 2.66 Gy
Plan Total Fractions Prescribed: 16
Plan Total Prescribed Dose: 42.56 Gy
Reference Point Dosage Given to Date: 23.94 Gy
Reference Point Session Dosage Given: 2.66 Gy
Session Number: 9

## 2022-03-03 ENCOUNTER — Ambulatory Visit
Admission: RE | Admit: 2022-03-03 | Discharge: 2022-03-03 | Disposition: A | Discharge status: Home / Self Care | Payer: Medicare HMO | Source: Ambulatory Visit | Attending: Radiation Oncology | Admitting: Radiation Oncology

## 2022-03-03 ENCOUNTER — Other Ambulatory Visit: Payer: Self-pay

## 2022-03-03 DIAGNOSIS — C50411 Malignant neoplasm of upper-outer quadrant of right female breast: Secondary | ICD-10-CM | POA: Diagnosis present

## 2022-03-03 DIAGNOSIS — Z170 Estrogen receptor positive status [ER+]: Secondary | ICD-10-CM | POA: Insufficient documentation

## 2022-03-03 LAB — RAD ONC ARIA SESSION SUMMARY
Course Elapsed Days: 11
Plan Fractions Treated to Date: 10
Plan Prescribed Dose Per Fraction: 2.66 Gy
Plan Total Fractions Prescribed: 16
Plan Total Prescribed Dose: 42.56 Gy
Reference Point Dosage Given to Date: 26.6 Gy
Reference Point Session Dosage Given: 2.66 Gy
Session Number: 10

## 2022-03-06 ENCOUNTER — Other Ambulatory Visit: Payer: Self-pay

## 2022-03-06 ENCOUNTER — Ambulatory Visit
Admission: RE | Admit: 2022-03-06 | Discharge: 2022-03-06 | Disposition: A | Discharge status: Home / Self Care | Payer: Medicare HMO | Source: Ambulatory Visit | Attending: Radiation Oncology | Admitting: Radiation Oncology

## 2022-03-06 DIAGNOSIS — C50411 Malignant neoplasm of upper-outer quadrant of right female breast: Secondary | ICD-10-CM | POA: Diagnosis not present

## 2022-03-06 LAB — RAD ONC ARIA SESSION SUMMARY
Course Elapsed Days: 14
Plan Fractions Treated to Date: 11
Plan Prescribed Dose Per Fraction: 2.66 Gy
Plan Total Fractions Prescribed: 16
Plan Total Prescribed Dose: 42.56 Gy
Reference Point Dosage Given to Date: 29.26 Gy
Reference Point Session Dosage Given: 2.66 Gy
Session Number: 11

## 2022-03-07 ENCOUNTER — Other Ambulatory Visit: Payer: Self-pay

## 2022-03-07 ENCOUNTER — Ambulatory Visit
Admission: RE | Admit: 2022-03-07 | Discharge: 2022-03-07 | Disposition: A | Discharge status: Home / Self Care | Payer: Medicare HMO | Source: Ambulatory Visit | Attending: Radiation Oncology | Admitting: Radiation Oncology

## 2022-03-07 DIAGNOSIS — C50411 Malignant neoplasm of upper-outer quadrant of right female breast: Secondary | ICD-10-CM | POA: Diagnosis not present

## 2022-03-07 LAB — RAD ONC ARIA SESSION SUMMARY
Course Elapsed Days: 15
Plan Fractions Treated to Date: 12
Plan Prescribed Dose Per Fraction: 2.66 Gy
Plan Total Fractions Prescribed: 16
Plan Total Prescribed Dose: 42.56 Gy
Reference Point Dosage Given to Date: 31.92 Gy
Reference Point Session Dosage Given: 2.66 Gy
Session Number: 12

## 2022-03-08 ENCOUNTER — Ambulatory Visit
Admission: RE | Admit: 2022-03-08 | Discharge: 2022-03-08 | Disposition: A | Discharge status: Home / Self Care | Payer: Medicare HMO | Source: Ambulatory Visit | Attending: Radiation Oncology | Admitting: Radiation Oncology

## 2022-03-08 ENCOUNTER — Other Ambulatory Visit: Payer: Self-pay

## 2022-03-08 DIAGNOSIS — C50411 Malignant neoplasm of upper-outer quadrant of right female breast: Secondary | ICD-10-CM | POA: Diagnosis not present

## 2022-03-08 LAB — RAD ONC ARIA SESSION SUMMARY
Course Elapsed Days: 16
Plan Fractions Treated to Date: 13
Plan Prescribed Dose Per Fraction: 2.66 Gy
Plan Total Fractions Prescribed: 16
Plan Total Prescribed Dose: 42.56 Gy
Reference Point Dosage Given to Date: 34.58 Gy
Reference Point Session Dosage Given: 2.66 Gy
Session Number: 13

## 2022-03-09 ENCOUNTER — Other Ambulatory Visit: Payer: Self-pay

## 2022-03-09 ENCOUNTER — Ambulatory Visit
Admission: RE | Admit: 2022-03-09 | Discharge: 2022-03-09 | Disposition: A | Discharge status: Home / Self Care | Payer: Medicare HMO | Source: Ambulatory Visit | Attending: Radiation Oncology | Admitting: Radiation Oncology

## 2022-03-09 DIAGNOSIS — C50411 Malignant neoplasm of upper-outer quadrant of right female breast: Secondary | ICD-10-CM | POA: Diagnosis not present

## 2022-03-09 LAB — RAD ONC ARIA SESSION SUMMARY
Course Elapsed Days: 17
Plan Fractions Treated to Date: 14
Plan Prescribed Dose Per Fraction: 2.66 Gy
Plan Total Fractions Prescribed: 16
Plan Total Prescribed Dose: 42.56 Gy
Reference Point Dosage Given to Date: 37.24 Gy
Reference Point Session Dosage Given: 2.66 Gy
Session Number: 14

## 2022-03-10 ENCOUNTER — Ambulatory Visit
Admission: RE | Admit: 2022-03-10 | Discharge: 2022-03-10 | Disposition: A | Discharge status: Home / Self Care | Payer: Medicare HMO | Source: Ambulatory Visit | Attending: Radiation Oncology | Admitting: Radiation Oncology

## 2022-03-10 ENCOUNTER — Ambulatory Visit: Payer: Medicare HMO

## 2022-03-10 ENCOUNTER — Other Ambulatory Visit: Payer: Self-pay

## 2022-03-10 DIAGNOSIS — C50411 Malignant neoplasm of upper-outer quadrant of right female breast: Secondary | ICD-10-CM | POA: Diagnosis not present

## 2022-03-10 LAB — RAD ONC ARIA SESSION SUMMARY
Course Elapsed Days: 18
Plan Fractions Treated to Date: 15
Plan Prescribed Dose Per Fraction: 2.66 Gy
Plan Total Fractions Prescribed: 16
Plan Total Prescribed Dose: 42.56 Gy
Reference Point Dosage Given to Date: 39.9 Gy
Reference Point Session Dosage Given: 2.66 Gy
Session Number: 15

## 2022-03-13 ENCOUNTER — Ambulatory Visit
Admission: RE | Admit: 2022-03-13 | Discharge: 2022-03-13 | Disposition: A | Discharge status: Home / Self Care | Payer: Medicare HMO | Source: Ambulatory Visit | Attending: Radiation Oncology | Admitting: Radiation Oncology

## 2022-03-13 ENCOUNTER — Other Ambulatory Visit: Payer: Self-pay

## 2022-03-13 DIAGNOSIS — C50411 Malignant neoplasm of upper-outer quadrant of right female breast: Secondary | ICD-10-CM | POA: Diagnosis not present

## 2022-03-13 LAB — RAD ONC ARIA SESSION SUMMARY
Course Elapsed Days: 21
Plan Fractions Treated to Date: 16
Plan Prescribed Dose Per Fraction: 2.66 Gy
Plan Total Fractions Prescribed: 16
Plan Total Prescribed Dose: 42.56 Gy
Reference Point Dosage Given to Date: 42.56 Gy
Reference Point Session Dosage Given: 2.66 Gy
Session Number: 16

## 2022-03-14 ENCOUNTER — Ambulatory Visit
Admission: RE | Admit: 2022-03-14 | Discharge: 2022-03-14 | Disposition: A | Discharge status: Home / Self Care | Payer: Medicare HMO | Source: Ambulatory Visit | Attending: Radiation Oncology | Admitting: Radiation Oncology

## 2022-03-14 ENCOUNTER — Other Ambulatory Visit: Payer: Self-pay

## 2022-03-14 DIAGNOSIS — C50411 Malignant neoplasm of upper-outer quadrant of right female breast: Secondary | ICD-10-CM | POA: Diagnosis not present

## 2022-03-14 LAB — RAD ONC ARIA SESSION SUMMARY
Course Elapsed Days: 22
Plan Fractions Treated to Date: 1
Plan Prescribed Dose Per Fraction: 2 Gy
Plan Total Fractions Prescribed: 4
Plan Total Prescribed Dose: 8 Gy
Reference Point Dosage Given to Date: 2 Gy
Reference Point Session Dosage Given: 2 Gy
Session Number: 17

## 2022-03-15 ENCOUNTER — Ambulatory Visit
Admission: RE | Admit: 2022-03-15 | Discharge: 2022-03-15 | Disposition: A | Discharge status: Home / Self Care | Payer: Medicare HMO | Source: Ambulatory Visit | Attending: Radiation Oncology | Admitting: Radiation Oncology

## 2022-03-15 ENCOUNTER — Other Ambulatory Visit: Payer: Self-pay

## 2022-03-15 DIAGNOSIS — C50411 Malignant neoplasm of upper-outer quadrant of right female breast: Secondary | ICD-10-CM | POA: Diagnosis not present

## 2022-03-15 LAB — RAD ONC ARIA SESSION SUMMARY
Course Elapsed Days: 23
Plan Fractions Treated to Date: 2
Plan Prescribed Dose Per Fraction: 2 Gy
Plan Total Fractions Prescribed: 4
Plan Total Prescribed Dose: 8 Gy
Reference Point Dosage Given to Date: 4 Gy
Reference Point Session Dosage Given: 2 Gy
Session Number: 18

## 2022-03-16 ENCOUNTER — Ambulatory Visit
Admission: RE | Admit: 2022-03-16 | Discharge: 2022-03-16 | Disposition: A | Discharge status: Home / Self Care | Payer: Medicare HMO | Source: Ambulatory Visit | Attending: Radiation Oncology | Admitting: Radiation Oncology

## 2022-03-16 ENCOUNTER — Other Ambulatory Visit: Payer: Self-pay

## 2022-03-16 DIAGNOSIS — C50411 Malignant neoplasm of upper-outer quadrant of right female breast: Secondary | ICD-10-CM | POA: Diagnosis not present

## 2022-03-16 LAB — RAD ONC ARIA SESSION SUMMARY
Course Elapsed Days: 24
Plan Fractions Treated to Date: 3
Plan Prescribed Dose Per Fraction: 2 Gy
Plan Total Fractions Prescribed: 4
Plan Total Prescribed Dose: 8 Gy
Reference Point Dosage Given to Date: 6 Gy
Reference Point Session Dosage Given: 2 Gy
Session Number: 19

## 2022-03-17 ENCOUNTER — Encounter: Payer: Self-pay | Admitting: Radiation Oncology

## 2022-03-17 ENCOUNTER — Ambulatory Visit
Admission: RE | Admit: 2022-03-17 | Discharge: 2022-03-17 | Disposition: A | Discharge status: Home / Self Care | Payer: Medicare HMO | Source: Ambulatory Visit | Attending: Radiation Oncology | Admitting: Radiation Oncology

## 2022-03-17 ENCOUNTER — Other Ambulatory Visit: Payer: Self-pay

## 2022-03-17 DIAGNOSIS — Z170 Estrogen receptor positive status [ER+]: Secondary | ICD-10-CM

## 2022-03-17 DIAGNOSIS — C50411 Malignant neoplasm of upper-outer quadrant of right female breast: Secondary | ICD-10-CM | POA: Diagnosis not present

## 2022-03-17 LAB — RAD ONC ARIA SESSION SUMMARY
Course Elapsed Days: 25
Plan Fractions Treated to Date: 4
Plan Prescribed Dose Per Fraction: 2 Gy
Plan Total Fractions Prescribed: 4
Plan Total Prescribed Dose: 8 Gy
Reference Point Dosage Given to Date: 8 Gy
Reference Point Session Dosage Given: 2 Gy
Session Number: 20

## 2022-03-17 MED ORDER — RADIAPLEXRX EX GEL: Freq: Once | CUTANEOUS | Status: AC

## 2022-03-20 ENCOUNTER — Encounter: Payer: Self-pay | Admitting: *Deleted

## 2022-03-20 DIAGNOSIS — C50411 Malignant neoplasm of upper-outer quadrant of right female breast: Secondary | ICD-10-CM

## 2022-03-20 NOTE — Progress Notes (Signed)
                                                                                                                                                             Patient Name: Cynthia Mccullough MRN: [id number] DOB: [birth year] Referring Physician: Anastasia Pall (Profile Not Attached) Date of Service: 03/17/2022 [facility],                                                         End Of Treatment Note  Diagnoses: C50.411-Malignant neoplasm of upper-outer quadrant of right female breast  Cancer Staging: Stage IA, pT1bN0M0, grade 2, ER/PR positive invasive ductal carcinoma of the right breast   Intent: Curative  Radiation Treatment Dates: 02/20/2022 through 03/17/2022 Site Technique Total Dose (Gy) Dose per Fx (Gy) Completed Fx Beam Energies  Breast, Right: Breast_R 3D 42.56/42.56 2.66 16/16 10X  Breast, Right: Breast_R_Bst 3D 8/8 2 4/4 6X, 10X   Narrative: The patient tolerated radiation therapy relatively well. She developed fatigue and anticipated skin changes in the treatment field.   Plan: The patient will receive a call in about one month from the radiation oncology department. She will continue follow up with Dr. Burr Medico as well.   ________________________________________________    Carola Rhine, [facility]

## 2022-03-20 NOTE — Progress Notes (Unsigned)
Patient Care Team: Chesley Noon, MD as PCP - General (Family Medicine) Rockwell Germany, RN as Oncology Nurse Navigator Mauro Kaufmann, RN as Oncology Nurse Navigator Coralie Keens, MD as Consulting Physician (General Surgery) Truitt Merle, MD as Consulting Physician (Hematology) Kyung Rudd, MD as Consulting Physician (Radiation Oncology)   CHIEF COMPLAINT: Follow-up right breast cancer  Oncology History  Malignant neoplasm of upper-outer quadrant of right breast in female, estrogen receptor positive ([place])  12/03/2021 Imaging    IMPRESSION: Suspicious 0.5 cm mass in the right breast at the 10 o'clock position.   12/19/2021 Initial Diagnosis   Malignant neoplasm of upper-outer quadrant of right breast in female, estrogen receptor positive [facility])    Imaging      Genetic Testing   [facility] CancerNext-Expanded Panel+RNA was Negative. Report date is 12/30/2021.  The CancerNext-Expanded gene panel offered by [facility] and includes sequencing, rearrangement, and RNA analysis for the following 77 genes: AIP, ALK, APC, ATM, AXIN2, BAP1, BARD1, BLM, BMPR1A, BRCA1, BRCA2, BRIP1, CDC73, CDH1, CDK4, CDKN1B, CDKN2A, CHEK2, CTNNA1, DICER1, FANCC, FH, FLCN, GALNT12, KIF1B, LZTR1, MAX, MEN1, MET, MLH1, MSH2, MSH3, MSH6, MUTYH, NBN, NF1, NF2, NTHL1, PALB2, PHOX2B, PMS2, POT1, PRKAR1A, PTCH1, PTEN, RAD51C, RAD51D, RB1, RECQL, RET, SDHA, SDHAF2, SDHB, SDHC, SDHD, SMAD4, SMARCA4, SMARCB1, SMARCE1, STK11, SUFU, TMEM127, TP53, TSC1, TSC2, VHL and XRCC2 (sequencing and deletion/duplication); EGFR, EGLN1, HOXB13, KIT, MITF, PDGFRA, POLD1, and POLE (sequencing only); EPCAM and GREM1 (deletion/duplication only).    01/09/2022 Cancer Staging   Staging form: Breast, AJCC 8th Edition - Pathologic stage from 01/09/2022: Stage IA (pT1b, pN0(sn), cM0, G2, ER+, PR+, HER2-) - Signed by Alla Feeling, NP on 03/21/2022 Stage prefix: Initial diagnosis Method of lymph node assessment: Sentinel lymph node  biopsy Histologic grading system: 3 grade system   01/09/2022 Pathology Results   FINAL MICROSCOPIC DIAGNOSIS: A. BREAST, RIGHT W/SEED, LUMPECTOMY: - Invasive lobular carcinoma, pT1b, pN0 - Lobular carcinoma in situ - Biopsy site changes present - All margins are negative for carcinoma in situ or invasive carcinoma - See oncology table  B. SENTINEL LYMPH NODE, RIGHT AXILLARY, BIOPSY: - Negative for carcinoma (0/1)       02/20/2022 - 03/17/2022 Radiation Therapy   Intent: Curative Radiation Treatment Dates: 02/20/2022 through 03/17/2022 Site Technique Total Dose (Gy) Dose per Fx (Gy) Completed Fx Beam Energies  Breast, Right: Breast_R 3D 42.56/42.56 2.66 16/16 10X  Breast, Right: Breast_R_Bst 3D 8/8 2 4/4 6X, 10X         CURRENT THERAPY: S/p lumpectomy and adjuvant radiation completed 03/17/2022, pending antiestrogen therapy  INTERVAL HISTORY Ms. Kotecki returns for follow-up as scheduled, seen initially at diagnosis 12/2021.  In the interim she underwent lumpectomy and SLNB 01/09/2022, path showed invasive lobular carcinoma spanning 0.9 cm and LCIS with clear margins lymph node negative, staged pT1b N0.  Due to the small size Oncotype was not done and she completed adjuvant radiation last week. She is tired, breast is red and sore. She is using creams. She would like to go to [place] to see her mother who is not well. Pt has baseline RA but mild, essential tremor, and s/p total hysterectomy for heavy bleeding. No h/o thrombosis. Unsure of bone density status.   ROS  All other systems reviewed and negative   Past Medical History:  Diagnosis Date   Allergic rhinitis    Anxiety    Asthma    Breast cancer ([place])    Depression    DES exposure in utero  Essential tremor    HLD (hyperlipidemia)    HTN (hypertension)    Kidney stones    Panic attacks      Past Surgical History:  Procedure Laterality Date   ABDOMINAL HYSTERECTOMY     BREAST BIOPSY Right 12/13/2021   [place] RT BREAST  BX W LOC DEV 1ST LESION IMG BX SPEC US GUIDE 12/13/2021 [facility]   BREAST BIOPSY  01/05/2022   MM RT RADIOACTIVE SEED LOC MAMMO GUIDE 01/05/2022 [facility]MO[facility]   BREAST LUMPECTOMY WITH RADIOACTIVE SEED AND SENTINEL LYMPH NODE BIOPSY Right 01/09/2022   Procedure: RIGHT BREAST LUMPECTOMY WITH RADIOACTIVE SEED AND SENTINEL LYMPH NODE BIOPSY;  Surgeon: Coralie Keens, MD;  Location: [place];  Service: General;  Laterality: Right;   CERVICAL ABLATION     DILATION AND CURETTAGE OF UTERUS     EXCISION MORTON'S NEUROMA Right 01/02/2006   FOOT SURGERY     LAPAROSCOPIC ASSISTED VAGINAL HYSTERECTOMY N/A 04/23/2013   Procedure: LAPAROSCOPIC ASSISTED VAGINAL HYSTERECTOMY;  Surgeon: Cyril Mourning, MD;  Location: [place] [facility];  Service: Gynecology;  Laterality: N/A;   LIPOSUCTION     SALPINGOOPHORECTOMY Bilateral 04/23/2013   Procedure: SALPINGO OOPHORECTOMY;  Surgeon: Cyril Mourning, MD;  Location: [place];  Service: Gynecology;  Laterality: Bilateral;     Outpatient Encounter Medications as of 03/21/2022  Medication Sig   albuterol (PROVENTIL HFA;VENTOLIN HFA) 108 (90 Base) MCG/ACT inhaler Inhale 2 puffs into the lungs every 4 (four) hours as needed for wheezing or shortness of breath.   allopurinol (ZYLOPRIM) 100 MG tablet Take 100 mg by mouth 3 (three) times daily.   ALPRAZolam (XANAX) 0.5 MG tablet Take 0.5-0.75 mg by mouth at bedtime.   amLODipine (NORVASC) 5 MG tablet Take 1 tablet (5 mg total) by mouth daily.   BEELITH 362-20 MG TABS tablet Take 1 tablet by mouth daily.   budesonide (ENTOCORT EC) 3 MG 24 hr capsule Take 9 mg by mouth daily.   cetirizine (ZYRTEC) 10 MG tablet Take 10 mg by mouth at bedtime.    fenofibrate 160 MG tablet Take 160 mg by mouth at bedtime.    hydroxychloroquine (PLAQUENIL) 200 MG tablet Take 200 mg by mouth daily.   latanoprost (XALATAN) 0.005 % ophthalmic solution Place 1 drop into both eyes at bedtime.   levothyroxine (SYNTHROID,  LEVOTHROID) 50 MCG tablet Take 50 mcg by mouth daily before breakfast.   nebivolol (BYSTOLIC) 10 MG tablet Take 10 mg by mouth at bedtime.   nystatin (MYCOSTATIN/NYSTOP) powder Apply 1 Application topically 2 (two) times daily. To skin rashes underneath breasts, as needed   pantoprazole (PROTONIX) 40 MG tablet Take 40 mg by mouth daily.   traZODone (DESYREL) 100 MG tablet Take 1 tablet (100 mg total) by mouth at bedtime.   Vilazodone HCl (VIIBRYD) 40 MG TABS Take 40 mg by mouth daily.    zaleplon (SONATA) 10 MG capsule Take 2 capsules by mouth at bedtime.   [DISCONTINUED] traMADol (ULTRAM) 50 MG tablet Take 1 tablet (50 mg total) by mouth every 6 (six) hours as needed. (Patient not taking: Reported on 02/07/2022)   No facility-administered encounter medications on file as of 03/21/2022.     Today's Vitals   03/21/22 1135  BP: 139/70  Pulse: 70  Resp: 18  Temp: 98.9 F (37.2 C)  TempSrc: Oral  SpO2: 96%  Weight: 182 lb 8 oz (82.8 kg)  Height: 5\' 8"  (1.727 m)   Body mass index is 27.75 kg/m.   PHYSICAL EXAM  GENERAL:alert, no distress and comfortable SKIN: no rash  EYES: sclera clear LUNGS: normal breathing effort HEART: no lower extremity edema NEURO: alert & oriented x 3 with fluent speech Breast exam: inspection reveals R breast s/p lumpectomy and radiation, incisions completely healed. No nipple discharge or inversion. Diffuse moderate to severe erythema, no open wound, mild peeling at the inframammary fold   CBC    Component Value Date/Time   WBC 5.7 12/21/2021 0835   WBC 5.7 03/13/2020 0515   RBC 4.41 12/21/2021 0835   HGB 15.2 (H) 12/21/2021 0835   HCT 44.4 12/21/2021 0835   PLT 234 12/21/2021 0835   MCV 100.7 (H) 12/21/2021 0835   MCH 34.5 (H) 12/21/2021 0835   MCHC 34.2 12/21/2021 0835   RDW 12.7 12/21/2021 0835   LYMPHSABS 2.0 12/21/2021 0835   MONOABS 0.5 12/21/2021 0835   EOSABS 0.2 12/21/2021 0835   BASOSABS 0.1 12/21/2021 0835     CMP     Component  Value Date/Time   NA 135 01/05/2022 1150   K 3.9 01/05/2022 1150   CL 100 01/05/2022 1150   CO2 27 01/05/2022 1150   GLUCOSE 97 01/05/2022 1150   BUN 14 01/05/2022 1150   CREATININE 0.87 01/05/2022 1150   CREATININE 0.83 12/21/2021 0835   CALCIUM 9.2 01/05/2022 1150   PROT 6.6 12/21/2021 0835   ALBUMIN 3.5 12/21/2021 0835   AST 20 12/21/2021 0835   ALT 21 12/21/2021 0835   ALKPHOS 34 (L) 12/21/2021 0835   BILITOT 0.7 12/21/2021 0835   GFRNONAA >60 01/05/2022 1150   GFRNONAA >60 12/21/2021 0835   GFRAA >60 06/10/2018 0038     ASSESSMENT & PLAN:Cari M Balli is a 72 y.o. postmenopausal woman, presented with screening discovered right breast cancer    1.  Malignant neoplasm of upper outer quadrant of right breast, estrogen receptor positive, invasive lobular carcinoma and LCIS, pT1bN0M0 stage IA, grade 2 --Diagnosed 12/2021, s/p right lumpectomy by Dr. Ninfa Linden, I reviewed path which showed 0.9 cm ILC and LCIS, SLN negative, and clear margins. Due to small size, oncotype was not done -She completed adjuvant radiation by Dr. Lisbeth Renshaw 02/20/22 - 03/17/22, she has diffuse erythema and tenderness. We reviewed symptom management and recovery expectations. -Giving the strong ER and PR expression in her postmenopausal status, I recommend adjuvant endocrine therapy with aromatase inhibitor or tamoxifen for a total of 5-10 years to reduce the risk of cancer recurrence. Potential benefits and side effects were discussed with patient and she is interested. -Ms. Pflaum appears stable. She is s/p total hysterectomy, with baseline mild RA, unknown bone density status. I am leaning towards tamoxifen. She will schedule DEXA after she gets back from [place] to see her mother -I plan to call her in 4-6 weeks to start anti-estrogen, likely Tamoxifen. The potential benefit and side effects, including but not limited to hot flash, skin and vaginal dryness, metabolic changes ( increased blood glucose,  cholesterol, weight, etc.), slightly in increased risk of cardiovascular disease, cataracts, muscular and joint discomfort, osteopenia and osteoporosis, etc, were discussed with her in great details. She is interested -We also discussed the surveillance plan   2. Bone Health -will obtain a baseline DEXA scan before she starts AI      PLAN: -Reviewed surgical path and adjuvant radiation, completed 3/15 -DEXA in the next month -Phone visit in 4-6 weeks to start anti-estrogen therapy, likely tamoxifen -Lab and f/up in person in 4 months    All questions were answered. The patient  knows to call the clinic with any problems, questions or concerns. No barriers to learning were detected. I spent 20 minutes counseling the patient face to face. The total time spent in the appointment was 30 minutes and more than 50% was on counseling, review of test results, and coordination of care.   Cira Rue, NP-C 03/21/2022

## 2022-03-21 ENCOUNTER — Other Ambulatory Visit: Payer: Self-pay

## 2022-03-21 ENCOUNTER — Encounter: Payer: Self-pay | Admitting: Nurse Practitioner

## 2022-03-21 ENCOUNTER — Inpatient Hospital Stay: Payer: Medicare HMO | Attending: Hematology | Admitting: Nurse Practitioner

## 2022-03-21 VITALS — BP 139/70 | HR 70 | Temp 98.9°F | Resp 18 | Ht 68.0 in | Wt 182.5 lb

## 2022-03-21 DIAGNOSIS — I10 Essential (primary) hypertension: Secondary | ICD-10-CM | POA: Diagnosis not present

## 2022-03-21 DIAGNOSIS — Z79899 Other long term (current) drug therapy: Secondary | ICD-10-CM | POA: Insufficient documentation

## 2022-03-21 DIAGNOSIS — Z923 Personal history of irradiation: Secondary | ICD-10-CM | POA: Insufficient documentation

## 2022-03-21 DIAGNOSIS — C50411 Malignant neoplasm of upper-outer quadrant of right female breast: Secondary | ICD-10-CM | POA: Diagnosis not present

## 2022-03-21 DIAGNOSIS — Z170 Estrogen receptor positive status [ER+]: Secondary | ICD-10-CM | POA: Diagnosis not present

## 2022-03-21 DIAGNOSIS — Z7952 Long term (current) use of systemic steroids: Secondary | ICD-10-CM | POA: Insufficient documentation

## 2022-03-27 ENCOUNTER — Telehealth: Payer: Self-pay | Admitting: Nurse Practitioner

## 2022-03-27 ENCOUNTER — Ambulatory Visit: Payer: Medicare HMO | Admitting: Adult Health

## 2022-03-27 NOTE — Telephone Encounter (Signed)
Per 3/25 IB reached out to patient to schedule; left voicemail.

## 2022-04-13 ENCOUNTER — Ambulatory Visit: Payer: Medicare HMO | Admitting: Hematology

## 2022-04-17 ENCOUNTER — Inpatient Hospital Stay: Payer: Medicare HMO | Attending: Hematology | Admitting: Nurse Practitioner

## 2022-04-17 ENCOUNTER — Encounter: Payer: Self-pay | Admitting: Nurse Practitioner

## 2022-04-17 DIAGNOSIS — C50411 Malignant neoplasm of upper-outer quadrant of right female breast: Secondary | ICD-10-CM | POA: Diagnosis not present

## 2022-04-17 DIAGNOSIS — Z170 Estrogen receptor positive status [ER+]: Secondary | ICD-10-CM | POA: Diagnosis not present

## 2022-04-17 MED ORDER — TAMOXIFEN CITRATE 20 MG PO TABS: 20.0000 mg | ORAL_TABLET | Freq: Every day | ORAL | 3 refills | Status: DC

## 2022-04-17 NOTE — Progress Notes (Signed)
Patient Care Team: Eartha Inch, MD as PCP - General (Family Medicine) Donnelly Angelica, RN as Oncology Nurse Navigator Pershing Proud, RN as Oncology Nurse Navigator Abigail Miyamoto, MD as Consulting Physician (General Surgery) Malachy Mood, MD as Consulting Physician (Hematology) Dorothy Puffer, MD as Consulting Physician (Radiation Oncology)   I connected with Cynthia Mccullough on 04/17/22 at 11:30 AM EDT by telephone visit and verified that I am speaking with the correct person using two identifiers.   I discussed the limitations, risks, security and privacy concerns of performing an evaluation and management service by telemedicine and the availability of in-person appointments. I also discussed with the patient that there may be a patient responsible charge related to this service. The patient expressed understanding and agreed to proceed.   Other persons participating in the visit and their role in the encounter: None   Patient's location: Home  Provider's location: [facility] office   Chief Complaint: Start anti-estrogen therapy    Oncology History  Malignant neoplasm of upper-outer quadrant of right breast in female, estrogen receptor positive  12/03/2021 Imaging    IMPRESSION: Suspicious 0.5 cm mass in the right breast at the 10 o'clock position.   12/19/2021 Initial Diagnosis   Malignant neoplasm of upper-outer quadrant of right breast in female, estrogen receptor positive [facility])    Imaging      Genetic Testing   [facility] CancerNext-Expanded Panel+RNA was Negative. Report date is 12/30/2021.  The CancerNext-Expanded gene panel offered by [facility] and includes sequencing, rearrangement, and RNA analysis for the following 77 genes: AIP, ALK, APC, ATM, AXIN2, BAP1, BARD1, BLM, BMPR1A, BRCA1, BRCA2, BRIP1, CDC73, CDH1, CDK4, CDKN1B, CDKN2A, CHEK2, CTNNA1, DICER1, FANCC, FH, FLCN, GALNT12, KIF1B, LZTR1, MAX, MEN1, MET, MLH1, MSH2, MSH3, MSH6, MUTYH, NBN, NF1, NF2,  NTHL1, PALB2, PHOX2B, PMS2, POT1, PRKAR1A, PTCH1, PTEN, RAD51C, RAD51D, RB1, RECQL, RET, SDHA, SDHAF2, SDHB, SDHC, SDHD, SMAD4, SMARCA4, SMARCB1, SMARCE1, STK11, SUFU, TMEM127, TP53, TSC1, TSC2, VHL and XRCC2 (sequencing and deletion/duplication); EGFR, EGLN1, HOXB13, KIT, MITF, PDGFRA, POLD1, and POLE (sequencing only); EPCAM and GREM1 (deletion/duplication only).    01/09/2022 Cancer Staging   Staging form: Breast, AJCC 8th Edition - Pathologic stage from 01/09/2022: Stage IA (pT1b, pN0(sn), cM0, G2, ER+, PR+, HER2-) - Signed by Pollyann Samples, NP on 03/21/2022 Stage prefix: Initial diagnosis Method of lymph node assessment: Sentinel lymph node biopsy Histologic grading system: 3 grade system   01/09/2022 Pathology Results   FINAL MICROSCOPIC DIAGNOSIS: A. BREAST, RIGHT W/SEED, LUMPECTOMY: - Invasive lobular carcinoma, pT1b, pN0 - Lobular carcinoma in situ - Biopsy site changes present - All margins are negative for carcinoma in situ or invasive carcinoma - See oncology table  B. SENTINEL LYMPH NODE, RIGHT AXILLARY, BIOPSY: - Negative for carcinoma (0/1)       02/20/2022 - 03/17/2022 Radiation Therapy   Intent: Curative Radiation Treatment Dates: 02/20/2022 through 03/17/2022 Site Technique Total Dose (Gy) Dose per Fx (Gy) Completed Fx Beam Energies  Breast, Right: Breast_R 3D 42.56/42.56 2.66 16/16 10X  Breast, Right: Breast_R_Bst 3D 8/8 2 4/4 6X, 10X         CURRENT THERAPY: S/p lumpectomy and adjuvant radiation, pending start of anti-estrogen therapy   INTERVAL HISTORY Cynthia Mccullough presents by phone to discuss anti-estrogen therapy. She had a good trip to [place] and [facility]. She is healing well from radiation, some peeling and still numb under her arm but no new breast concerns. She continues to cut back  smoking, < 1 PPD now. Stress has improved in other parts of life. She takes Viibryd for anxiety/depression which helps. This is managed by Dr. Milagros Evener.  S/p hysterectomy in late 50's, and no h/o DVT/PE.  ROS  All other systems reviewed and negative   Past Medical History:  Diagnosis Date   Allergic rhinitis    Anxiety    Asthma    Breast cancer    Depression    DES exposure in utero    Essential tremor    HLD (hyperlipidemia)    HTN (hypertension)    Kidney stones    Panic attacks      Past Surgical History:  Procedure Laterality Date   ABDOMINAL HYSTERECTOMY     BREAST BIOPSY Right 12/13/2021   [place] RT BREAST BX W LOC DEV 1ST LESION IMG BX SPEC US GUIDE 12/13/2021 [facility]   BREAST BIOPSY  01/05/2022   MM RT RADIOACTIVE SEED LOC MAMMO GUIDE 01/05/2022 [facility]MO[facility]   BREAST LUMPECTOMY WITH RADIOACTIVE SEED AND SENTINEL LYMPH NODE BIOPSY Right 01/09/2022   Procedure: RIGHT BREAST LUMPECTOMY WITH RADIOACTIVE SEED AND SENTINEL LYMPH NODE BIOPSY;  Surgeon: Abigail Miyamoto, MD;  Location: [facility];  Service: General;  Laterality: Right;   CERVICAL ABLATION     DILATION AND CURETTAGE OF UTERUS     EXCISION MORTON'S NEUROMA Right 01/02/2006   FOOT SURGERY     LAPAROSCOPIC ASSISTED VAGINAL HYSTERECTOMY N/A 04/23/2013   Procedure: LAPAROSCOPIC ASSISTED VAGINAL HYSTERECTOMY;  Surgeon: Jeani Hawking, MD;  Location: [facility];  Service: Gynecology;  Laterality: N/A;   LIPOSUCTION     SALPINGOOPHORECTOMY Bilateral 04/23/2013   Procedure: SALPINGO OOPHORECTOMY;  Surgeon: Jeani Hawking, MD;  Location: [facility];  Service: Gynecology;  Laterality: Bilateral;     Outpatient Encounter Medications as of 04/17/2022  Medication Sig   tamoxifen (NOLVADEX) 20 MG tablet Take 1 tablet (20 mg total) by mouth daily.   albuterol (PROVENTIL HFA;VENTOLIN HFA) 108 (90 Base) MCG/ACT inhaler Inhale 2 puffs into the lungs every 4 (four) hours as needed for wheezing or shortness of breath.   allopurinol (ZYLOPRIM) 100 MG tablet Take 100 mg by mouth 3 (three) times daily.   ALPRAZolam (XANAX) 0.5 MG tablet Take 0.5-0.75 mg  by mouth at bedtime.   amLODipine (NORVASC) 5 MG tablet Take 1 tablet (5 mg total) by mouth daily.   BEELITH 362-20 MG TABS tablet Take 1 tablet by mouth daily.   budesonide (ENTOCORT EC) 3 MG 24 hr capsule Take 9 mg by mouth daily.   cetirizine (ZYRTEC) 10 MG tablet Take 10 mg by mouth at bedtime.    fenofibrate 160 MG tablet Take 160 mg by mouth at bedtime.    hydroxychloroquine (PLAQUENIL) 200 MG tablet Take 200 mg by mouth daily.   latanoprost (XALATAN) 0.005 % ophthalmic solution Place 1 drop into both eyes at bedtime.   levothyroxine (SYNTHROID, LEVOTHROID) 50 MCG tablet Take 50 mcg by mouth daily before breakfast.   nebivolol (BYSTOLIC) 10 MG tablet Take 10 mg by mouth at bedtime.   nystatin (MYCOSTATIN/NYSTOP) powder Apply 1 Application topically 2 (two) times daily. To skin rashes underneath breasts, as needed   pantoprazole (PROTONIX) 40 MG tablet Take 40 mg by mouth daily.   traZODone (DESYREL) 100 MG tablet Take 1 tablet (100 mg total) by mouth at bedtime.   Vilazodone HCl (VIIBRYD) 40 MG TABS Take 40 mg by mouth daily.    zaleplon (SONATA) 10 MG capsule Take 2  capsules by mouth at bedtime.   No facility-administered encounter medications on file as of 04/17/2022.     There were no vitals filed for this visit. There is no height or weight on file to calculate BMI.   PHYSICAL EXAM Telephone encounter  CBC    Component Value Date/Time   WBC 5.7 12/21/2021 0835   WBC 5.7 03/13/2020 0515   RBC 4.41 12/21/2021 0835   HGB 15.2 (H) 12/21/2021 0835   HCT 44.4 12/21/2021 0835   PLT 234 12/21/2021 0835   MCV 100.7 (H) 12/21/2021 0835   MCH 34.5 (H) 12/21/2021 0835   MCHC 34.2 12/21/2021 0835   RDW 12.7 12/21/2021 0835   LYMPHSABS 2.0 12/21/2021 0835   MONOABS 0.5 12/21/2021 0835   EOSABS 0.2 12/21/2021 0835   BASOSABS 0.1 12/21/2021 0835     CMP     Component Value Date/Time   NA 135 01/05/2022 1150   K 3.9 01/05/2022 1150   CL 100 01/05/2022 1150   CO2 27  01/05/2022 1150   GLUCOSE 97 01/05/2022 1150   BUN 14 01/05/2022 1150   CREATININE 0.87 01/05/2022 1150   CREATININE 0.83 12/21/2021 0835   CALCIUM 9.2 01/05/2022 1150   PROT 6.6 12/21/2021 0835   ALBUMIN 3.5 12/21/2021 0835   AST 20 12/21/2021 0835   ALT 21 12/21/2021 0835   ALKPHOS 34 (L) 12/21/2021 0835   BILITOT 0.7 12/21/2021 0835   GFRNONAA >60 01/05/2022 1150   GFRNONAA >60 12/21/2021 0835   GFRAA >60 06/10/2018 0038     ASSESSMENT & PLAN:Cynthia Mccullough is a 72 y.o. postmenopausal woman, presented with screening discovered right breast cancer    1.  Malignant neoplasm of upper outer quadrant of right breast, estrogen receptor positive, invasive lobular carcinoma and LCIS, pT1bN0M0 stage IA, grade 2 -Diagnosed 12/2021, s/p right lumpectomy by Dr. Magnus Ivan, path showed 0.9 cm ILC and LCIS, SLN negative, and clear margins. Due to small size, oncotype was not done -S/p adjuvant radiation by Dr. Mitzi Hansen 02/20/22 - 03/17/22, she had diffuse erythema and tenderness but has recovered well -Given the strong ER and PR expression in her postmenopausal status, I recommend adjuvant endocrine therapy with aromatase inhibitor or tamoxifen for a total of 5-10 years to reduce the risk of cancer recurrence. Potential benefits and side effects were discussed with patient and she is interested. -She is s/p total hysterectomy, with baseline mild RA, and no h/o DVT/PE. She had DEXA done and we have requested the results. She continues to cut back smoking.  -Given her above medical history, I recommend Tamoxifen for at least 7 years, due to the lobular histology. Potential benefit and side effects were reviewed again. She agrees to start in the next 1-2 weeks -We also discussed the surveillance plan -Follow-up with Dr. Mosetta Putt 7/22 as scheduled, or sooner if needed   2. Bone Health -I have requested the report from [facility]  3. Health maintenance  -I will cc my note to Dr. Evelene Croon who manages  her mental health, given possible DDI with Tamoxifen/viibryd (tamoxifen may increase vilazodone levels) -I encouraged her to continue smoking cessation, avoid alcohol, exercise, and continue healthy active lifestyle    PLAN: -Begin tamoxifen in 1-2 weeks -Follow-up with Dr. Mosetta Putt 7/22 as scheduled, or sooner if needed -CC note to Dr. Evelene Croon   I discussed the assessment and treatment plan with the patient. The patient was provided an opportunity to ask questions and all were answered. The patient agreed with the  plan and demonstrated an understanding of the instructions.   The patient was advised to call back or seek an in-person evaluation if the symptoms worsen or if the condition fails to improve as anticipated. No barriers to learning were detected. I spent 13 minutes counseling the patient non-face to face.    Santiago Glad, NP-C 04/17/2022

## 2022-04-24 ENCOUNTER — Ambulatory Visit: Payer: Medicare HMO

## 2022-04-26 ENCOUNTER — Encounter: Payer: Self-pay | Admitting: Hematology

## 2022-05-08 ENCOUNTER — Ambulatory Visit
Admission: RE | Admit: 2022-05-08 | Discharge: 2022-05-08 | Disposition: A | Discharge status: Home / Self Care | Payer: Medicare HMO | Source: Ambulatory Visit | Attending: Radiation Oncology | Admitting: Radiation Oncology

## 2022-05-08 NOTE — Progress Notes (Signed)
  Radiation Oncology         [phone] ________________________________  Name: Cynthia Mccullough [facility] MRN: [id number]  Date of Service: 05/08/2022  DOB: 10-19-1950  Post Treatment Telephone Note  Diagnosis:  Stage IA, pT1bN0M0, grade 2, ER/PR positive invasive ductal carcinoma of the right breast   Intent: Curative  Radiation Treatment Dates: 02/20/2022 through 03/17/2022 Site Technique Total Dose (Gy) Dose per Fx (Gy) Completed Fx Beam Energies  Breast, Right: Breast_R 3D 42.56/42.56 2.66 16/16 10X  Breast, Right: Breast_R_Bst 3D 8/8 2 4/4 6X, 10X   (as documented in provider EOT note)   The patient was available for call today.   Symptoms of fatigue have improved since completing therapy.  Symptoms of skin changes have improved since completing therapy.  The patient was encouraged to avoid sun exposure in the area of prior treatment for up to one year following radiation with either sunscreen or by the style of clothing worn in the sun.   The patient has scheduled follow up with her medical oncologist Dr. Mosetta Putt for ongoing surveillance, and was encouraged to call if she develops concerns or questions regarding radiation.   This concludes the interview.   Ruel Favors, LPN

## 2022-07-13 ENCOUNTER — Other Ambulatory Visit: Payer: Self-pay | Admitting: Nurse Practitioner

## 2022-07-13 ENCOUNTER — Other Ambulatory Visit: Payer: Self-pay

## 2022-07-13 NOTE — Progress Notes (Signed)
Patient called asking if she could change medication Tamoxifen because she is having a lot of side effects. Joint pain and back pain since she started the medication. She is also concerned that the Tamoxifen and hydroxychloroquine taken together could cause a detached retina. Is there anything else she can take in its place. She is going out of town at the end of the week. If there is no other option she will discuss having the other med changed with her Rheumatologist.

## 2022-07-21 ENCOUNTER — Other Ambulatory Visit: Payer: Self-pay

## 2022-07-21 DIAGNOSIS — C50411 Malignant neoplasm of upper-outer quadrant of right female breast: Secondary | ICD-10-CM

## 2022-07-24 ENCOUNTER — Inpatient Hospital Stay: Payer: Medicare HMO | Attending: Nurse Practitioner

## 2022-07-24 ENCOUNTER — Inpatient Hospital Stay: Payer: Medicare HMO | Admitting: Hematology

## 2022-07-24 VITALS — BP 128/69 | HR 78 | Temp 99.2°F | Resp 17 | Wt 190.2 lb

## 2022-07-24 DIAGNOSIS — C50411 Malignant neoplasm of upper-outer quadrant of right female breast: Secondary | ICD-10-CM

## 2022-07-24 DIAGNOSIS — Z79811 Long term (current) use of aromatase inhibitors: Secondary | ICD-10-CM | POA: Insufficient documentation

## 2022-07-24 DIAGNOSIS — Z170 Estrogen receptor positive status [ER+]: Secondary | ICD-10-CM | POA: Diagnosis not present

## 2022-07-24 DIAGNOSIS — Z7952 Long term (current) use of systemic steroids: Secondary | ICD-10-CM | POA: Insufficient documentation

## 2022-07-24 DIAGNOSIS — Z79899 Other long term (current) drug therapy: Secondary | ICD-10-CM | POA: Insufficient documentation

## 2022-07-24 DIAGNOSIS — Z780 Asymptomatic menopausal state: Secondary | ICD-10-CM

## 2022-07-24 DIAGNOSIS — Z923 Personal history of irradiation: Secondary | ICD-10-CM | POA: Insufficient documentation

## 2022-07-24 DIAGNOSIS — M85851 Other specified disorders of bone density and structure, right thigh: Secondary | ICD-10-CM | POA: Diagnosis not present

## 2022-07-24 DIAGNOSIS — M8580 Other specified disorders of bone density and structure, unspecified site: Secondary | ICD-10-CM | POA: Diagnosis not present

## 2022-07-24 DIAGNOSIS — I10 Essential (primary) hypertension: Secondary | ICD-10-CM | POA: Diagnosis not present

## 2022-07-24 LAB — CBC WITH DIFFERENTIAL (CANCER CENTER ONLY)
Abs Immature Granulocytes: 0.02 10*3/uL (ref 0.00–0.07)
Basophils Absolute: 0.1 10*3/uL (ref 0.0–0.1)
Basophils Relative: 1 %
Eosinophils Absolute: 0.4 10*3/uL (ref 0.0–0.5)
Eosinophils Relative: 7 %
HCT: 37.9 % (ref 36.0–46.0)
Hemoglobin: 13.1 g/dL (ref 12.0–15.0)
Immature Granulocytes: 0 %
Lymphocytes Relative: 12 %
Lymphs Abs: 0.7 10*3/uL (ref 0.7–4.0)
MCH: 35.2 pg — ABNORMAL HIGH (ref 26.0–34.0)
MCHC: 34.6 g/dL (ref 30.0–36.0)
MCV: 101.9 fL — ABNORMAL HIGH (ref 80.0–100.0)
Monocytes Absolute: 0.4 10*3/uL (ref 0.1–1.0)
Monocytes Relative: 6 %
Neutro Abs: 4.1 10*3/uL (ref 1.7–7.7)
Neutrophils Relative %: 74 %
Platelet Count: 170 10*3/uL (ref 150–400)
RBC: 3.72 MIL/uL — ABNORMAL LOW (ref 3.87–5.11)
RDW: 12.5 % (ref 11.5–15.5)
WBC Count: 5.6 10*3/uL (ref 4.0–10.5)
nRBC: 0 % (ref 0.0–0.2)

## 2022-07-24 LAB — CMP (CANCER CENTER ONLY)
ALT: 19 U/L (ref 0–44)
AST: 26 U/L (ref 15–41)
Albumin: 3.7 g/dL (ref 3.5–5.0)
Alkaline Phosphatase: 24 U/L — ABNORMAL LOW (ref 38–126)
Anion gap: 6 (ref 5–15)
BUN: 9 mg/dL (ref 8–23)
CO2: 28 mmol/L (ref 22–32)
Calcium: 8.7 mg/dL — ABNORMAL LOW (ref 8.9–10.3)
Chloride: 109 mmol/L (ref 98–111)
Creatinine: 0.84 mg/dL (ref 0.44–1.00)
GFR, Estimated: >60 mL/min (ref >60)
Glucose, Bld: 93 mg/dL (ref 70–99)
Potassium: 3.8 mmol/L (ref 3.5–5.1)
Sodium: 143 mmol/L (ref 135–145)
Total Bilirubin: 0.5 mg/dL (ref 0.3–1.2)
Total Protein: 5.8 g/dL — ABNORMAL LOW (ref 6.5–8.1)

## 2022-07-24 MED ORDER — EXEMESTANE 25 MG PO TABS: 25.0000 mg | ORAL_TABLET | Freq: Every day | ORAL | 2 refills | Status: DC

## 2022-07-24 NOTE — Assessment & Plan Note (Signed)
-  Her bone density scan in April 2024 showed osteopenia in right hip.  Her 10-year risk of fracture is 14%, with 3.4% hip fracture -Due to the high risk of hip fracture, I recommend biphosphonate Zometa infusion every 6 months.  I discussed the benefit and potential side effects, especially the risk of jaw necrosis, and will get dental clearance. She is interested.

## 2022-07-24 NOTE — Progress Notes (Signed)
[facility]   [phone] [phone]   Clinic Follow up Note   Patient Care Team: Eartha Inch, MD as PCP - General (Family Medicine) Donnelly Angelica, RN as Oncology Nurse Navigator Pershing Proud, RN as Oncology Nurse Navigator Abigail Miyamoto, MD as Consulting Physician (General Surgery) Malachy Mood, MD as Consulting Physician (Hematology) Dorothy Puffer, MD as Consulting Physician (Radiation Oncology)  Date of Service:  07/24/2022  CHIEF COMPLAINT: f/u of right breast cancer  CURRENT THERAPY:  Started Tamoxifen 05/2022  ASSESSMENT:  Cynthia Mccullough is a 72 y.o. 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 Magnus Ivan, I reviewed path which showed 0.9 cm ILC and LCIS, SLN negative, and clear margins. Due to small size, oncotype was not done -She completed adjuvant radiation by Dr. Mitzi Hansen 02/20/22 - 03/17/22, she tolerated well except radiation dermatitis. -she started adjuvant tamoxifen in May 2024.  She has been tolerating well.  However she was told by her rheumatologist that if she continue tamoxifen and Plaquenil, she has increased her risk of retina detachment.  So I will change her tamoxifen to exemestane.  Potential side effect of tamoxifen reviewed with her, especially negative impact on her bone density, and arthralgia.  She is willing to try. -She is clinically doing well, exam revealed a 3 cm lump in the upper outer quadrant of her right breast, likely surgical scar, but that this is new in the past months.  I will order diagnostic mammogram and ultrasound of right breast for further evaluation. -  Osteopenia after menopause -Her bone density scan in April 2024 showed osteopenia in right hip.  Her 10-year risk of fracture is 14%, with 3.4% hip  fracture -Due to the high risk of hip fracture, I recommend biphosphonate Zometa infusion every 6 months.  I discussed the benefit and potential side effects, especially the risk of jaw necrosis, and will get dental clearance. She will think about it     PLAN: -lab reviewed -I prescribe Exemestane. - I order Breast  ultrasound, Mammogram in 2 weeks -f/u  phone visit in 3 months.  SUMMARY OF ONCOLOGIC HISTORY: Oncology History Overview Note   Cancer Staging  Malignant neoplasm of upper-outer quadrant of right breast in female, estrogen receptor positive (HCC) Staging form: Breast, AJCC 8th Edition - Clinical stage from 12/19/2021: cT1a, cN0, cM0, G2, ER+, PR+ - Unsigned Stage prefix: Initial diagnosis Method of lymph node assessment: Clinical Histologic grading system: 3 grade system - Pathologic stage from 01/09/2022: Stage IA (pT1b, pN0(sn), cM0, G2, ER+, PR+, HER2-) - Signed by Pollyann Samples, NP on 03/21/2022 Stage prefix: Initial diagnosis Method of lymph node assessment: Sentinel lymph node biopsy Histologic grading system: 3 grade system     Malignant neoplasm of upper-outer quadrant of right breast in female, estrogen receptor positive (HCC)  12/03/2021 Imaging    IMPRESSION: Suspicious 0.5 cm mass in the right breast at the 10 o'clock position.   12/19/2021 Initial Diagnosis   Malignant neoplasm of upper-outer quadrant of right breast in female, estrogen receptor positive [facility])    Imaging      Genetic Testing   [facility] CancerNext-Expanded Panel+RNA was Negative. Report date is 12/30/2021.  The CancerNext-Expanded gene panel offered by [facility] and includes sequencing, rearrangement, and RNA analysis for the following 77 genes: AIP, ALK, APC, ATM,  AXIN2, BAP1, BARD1, BLM, BMPR1A, BRCA1, BRCA2, BRIP1, CDC73, CDH1, CDK4, CDKN1B, CDKN2A, CHEK2, CTNNA1, DICER1, FANCC, FH, FLCN, GALNT12, KIF1B, LZTR1, MAX, MEN1, MET, MLH1, MSH2, MSH3, MSH6, MUTYH, NBN, NF1, NF2, NTHL1,  PALB2, PHOX2B, PMS2, POT1, PRKAR1A, PTCH1, PTEN, RAD51C, RAD51D, RB1, RECQL, RET, SDHA, SDHAF2, SDHB, SDHC, SDHD, SMAD4, SMARCA4, SMARCB1, SMARCE1, STK11, SUFU, TMEM127, TP53, TSC1, TSC2, VHL and XRCC2 (sequencing and deletion/duplication); EGFR, EGLN1, HOXB13, KIT, MITF, PDGFRA, POLD1, and POLE (sequencing only); EPCAM and GREM1 (deletion/duplication only).    01/09/2022 Cancer Staging   Staging form: Breast, AJCC 8th Edition - Pathologic stage from 01/09/2022: Stage IA (pT1b, pN0(sn), cM0, G2, ER+, PR+, HER2-) - Signed by Pollyann Samples, NP on 03/21/2022 Stage prefix: Initial diagnosis Method of lymph node assessment: Sentinel lymph node biopsy Histologic grading system: 3 grade system   01/09/2022 Pathology Results   FINAL MICROSCOPIC DIAGNOSIS: A. BREAST, RIGHT W/SEED, LUMPECTOMY: - Invasive lobular carcinoma, pT1b, pN0 - Lobular carcinoma in situ - Biopsy site changes present - All margins are negative for carcinoma in situ or invasive carcinoma - See oncology table  B. SENTINEL LYMPH NODE, RIGHT AXILLARY, BIOPSY: - Negative for carcinoma (0/1)       02/20/2022 - 03/17/2022 Radiation Therapy   Intent: Curative Radiation Treatment Dates: 02/20/2022 through 03/17/2022 Site Technique Total Dose (Gy) Dose per Fx (Gy) Completed Fx Beam Energies  Breast, Right: Breast_R 3D 42.56/42.56 2.66 16/16 10X  Breast, Right: Breast_R_Bst 3D 8/8 2 4/4 6X, 10X         INTERVAL HISTORY:  Cynthia Mccullough is here for a follow up of right breast cancer. She was last seen by NP Lacie on 04/17/2022.  She presents to the clinic alone. Pt state that she has some decongestant and a low grade fever, with mild body aches today.  She just returned from a trip, and her family also got similar symptoms.  She has been on tamoxifen for 2 to 3 months, she is tolerating well.  All other systems were reviewed with the patient and are negative.  MEDICAL HISTORY:  Past Medical History:  Diagnosis Date   Allergic  rhinitis    Anxiety    Asthma    Breast cancer (HCC)    Depression    DES exposure in utero    Essential tremor    HLD (hyperlipidemia)    HTN (hypertension)    Kidney stones    Panic attacks     SURGICAL HISTORY: Past Surgical History:  Procedure Laterality Date   ABDOMINAL HYSTERECTOMY     BREAST BIOPSY Right 12/13/2021   [place] RT BREAST BX W LOC DEV 1ST LESION IMG BX SPEC US GUIDE 12/13/2021 [facility]   BREAST BIOPSY  01/05/2022   MM RT RADIOACTIVE SEED LOC MAMMO GUIDE 01/05/2022 [facility]AMMO[facility]   BREAST LUMPECTOMY WITH RADIOACTIVE SEED AND SENTINEL LYMPH NODE BIOPSY Right 01/09/2022   Procedure: RIGHT BREAST LUMPECTOMY WITH RADIOACTIVE SEED AND SENTINEL LYMPH NODE BIOPSY;  Surgeon: Abigail Miyamoto, MD;  Location: [facility];  Service: General;  Laterality: Right;   CERVICAL ABLATION     DILATION AND CURETTAGE OF UTERUS     EXCISION MORTON'S NEUROMA Right 01/02/2006   FOOT SURGERY     LAPAROSCOPIC ASSISTED VAGINAL HYSTERECTOMY N/A 04/23/2013   Procedure: LAPAROSCOPIC ASSISTED VAGINAL HYSTERECTOMY;  Surgeon: Jeani Hawking, MD;  Location: [facility];  Service: Gynecology;  Laterality: N/A;   LIPOSUCTION     SALPINGOOPHORECTOMY Bilateral 04/23/2013   Procedure: SALPINGO OOPHORECTOMY;  Surgeon: Marcelino Duster  Rosita Fire, MD;  Location: [facility];  Service: Gynecology;  Laterality: Bilateral;    I have reviewed the social history and family history with the patient and they are unchanged from previous note.  ALLERGIES:  is allergic to amoxicillin, metronidazole, penicillins, sulfonamide derivatives, cheese, atorvastatin, and rosuvastatin.  MEDICATIONS:  Current Outpatient Medications  Medication Sig Dispense Refill   exemestane (AROMASIN) 25 MG tablet Take 1 tablet (25 mg total) by mouth daily after breakfast. 30 tablet 2   albuterol (PROVENTIL HFA;VENTOLIN HFA) 108 (90 Base) MCG/ACT inhaler Inhale 2 puffs into the lungs every 4 (four) hours as needed for wheezing or  shortness of breath. 1 Inhaler 2   allopurinol (ZYLOPRIM) 100 MG tablet Take 100 mg by mouth 3 (three) times daily.     ALPRAZolam (XANAX) 0.5 MG tablet Take 0.5-0.75 mg by mouth at bedtime.     amLODipine (NORVASC) 5 MG tablet Take 1 tablet (5 mg total) by mouth daily. 30 tablet 1   BEELITH 362-20 MG TABS tablet Take 1 tablet by mouth daily.     budesonide (ENTOCORT EC) 3 MG 24 hr capsule Take 9 mg by mouth daily.     cetirizine (ZYRTEC) 10 MG tablet Take 10 mg by mouth at bedtime.      fenofibrate 160 MG tablet Take 160 mg by mouth at bedtime.      hydroxychloroquine (PLAQUENIL) 200 MG tablet Take 200 mg by mouth daily.     latanoprost (XALATAN) 0.005 % ophthalmic solution Place 1 drop into both eyes at bedtime.     levothyroxine (SYNTHROID, LEVOTHROID) 50 MCG tablet Take 50 mcg by mouth daily before breakfast.  0   nebivolol (BYSTOLIC) 10 MG tablet Take 10 mg by mouth at bedtime.     nystatin (MYCOSTATIN/NYSTOP) powder Apply 1 Application topically 2 (two) times daily. To skin rashes underneath breasts, as needed 30 g 1   pantoprazole (PROTONIX) 40 MG tablet Take 40 mg by mouth daily.     tamoxifen (NOLVADEX) 20 MG tablet Take 1 tablet (20 mg total) by mouth daily. 30 tablet 3   traZODone (DESYREL) 100 MG tablet Take 1 tablet (100 mg total) by mouth at bedtime. 30 tablet 0   Vilazodone HCl (VIIBRYD) 40 MG TABS Take 40 mg by mouth daily.      zaleplon (SONATA) 10 MG capsule Take 2 capsules by mouth at bedtime.     No current facility-administered medications for this visit.    PHYSICAL EXAMINATION: ECOG PERFORMANCE STATUS: 1 - Symptomatic but completely ambulatory  Vitals:   07/24/22 1317  BP: 128/69  Pulse: 78  Resp: 17  Temp: 99.2 F (37.3 C)  SpO2: 98%   Wt Readings from Last 3 Encounters:  07/24/22 190 lb 3.2 oz (86.3 kg)  03/21/22 182 lb 8 oz (82.8 kg)  02/09/22 180 lb 12.8 oz (82 kg)    GENERAL:alert, no distress and comfortable SKIN: skin color, texture, turgor are  normal, no rashes or significant lesions EYES: normal, Conjunctiva are pink and non-injected, sclera clear NECK: (-) supple, thyroid normal size, non-tender, without nodularity LYMPH:  (-) no palpable lymphadenopathy in the cervical, axillary  BREAST: Rt breast lumpectomy , Incision in axillary ,  right upper quadrant 1.5 x 3 cm mass,5 cm from the nipple. LT breast no palpable mass breast exam benign. LABORATORY DATA:  I have reviewed the data as listed    Latest Ref Rng & Units 07/24/2022   12:52 PM 12/21/2021    8:35 AM 03/13/2020  5:15 AM  CBC  WBC 4.0 - 10.5 K/uL 5.6  5.7  5.7   Hemoglobin 12.0 - 15.0 g/dL 16.1  09.6  04.5   Hematocrit 36.0 - 46.0 % 37.9  44.4  43.5   Platelets 150 - 400 K/uL 170  234  185         Latest Ref Rng & Units 07/24/2022   12:52 PM 01/05/2022   11:50 AM 12/21/2021    8:35 AM  CMP  Glucose 70 - 99 mg/dL 93  97  409   BUN 8 - 23 mg/dL 9  14  15    Creatinine 0.44 - 1.00 mg/dL 8.11  9.14  7.82   Sodium 135 - 145 mmol/L 143  135  141   Potassium 3.5 - 5.1 mmol/L 3.8  3.9  3.8   Chloride 98 - 111 mmol/L 109  100  103   CO2 22 - 32 mmol/L 28  27  30    Calcium 8.9 - 10.3 mg/dL 8.7  9.2  9.5   Total Protein 6.5 - 8.1 g/dL 5.8   6.6   Total Bilirubin 0.3 - 1.2 mg/dL 0.5   0.7   Alkaline Phos 38 - 126 U/L 24   34   AST 15 - 41 U/L 26   20   ALT 0 - 44 U/L 19   21       RADIOGRAPHIC STUDIES: I have personally reviewed the radiological images as listed and agreed with the findings in the report. No results found.    Orders Placed This Encounter  Procedures   [place] LIMITED ULTRASOUND INCLUDING AXILLA RIGHT BREAST    Standing Status:   Future    Standing Expiration Date:   07/24/2023    Order Specific Question:   Reason for Exam (SYMPTOM  OR DIAGNOSIS REQUIRED)    Answer:   ealuate right breast lump at 10:00 position 5cm fn    Order Specific Question:   Preferred imaging location?    Answer:   [facility]   MM DIAG BREAST TOMO UNI RIGHT     Standing Status:   Future    Standing Expiration Date:   07/24/2023    Order Specific Question:   Reason for Exam (SYMPTOM  OR DIAGNOSIS REQUIRED)    Answer:   ealuate right breast lump    Order Specific Question:   Preferred imaging location?    Answer:   [facility]   All questions were answered. The patient knows to call the clinic with any problems, questions or concerns. No barriers to learning was detected. The total time spent in the appointment was 30 minutes.     Malachy Mood, MD 07/24/2022   Carolin Coy, CMA, am acting as scribe for Malachy Mood, MD.   I have reviewed the above documentation for accuracy and completeness, and I agree with the above.

## 2022-07-24 NOTE — Assessment & Plan Note (Signed)
invasive lobular carcinoma and LCIS, pT1bN0M0 stage IA, grade 2 --Diagnosed 12/2021, s/p right lumpectomy by Dr. Magnus Ivan, I reviewed path which showed 0.9 cm ILC and LCIS, SLN negative, and clear margins. Due to small size, oncotype was not done -She completed adjuvant radiation by Dr. Mitzi Hansen 02/20/22 - 03/17/22, she tolerated well except radiation dermatitis. -she started adjuvant tamoxifen in May 2024

## 2022-07-25 ENCOUNTER — Telehealth: Payer: Self-pay | Admitting: Hematology

## 2022-08-08 ENCOUNTER — Encounter: Payer: Medicare HMO | Admitting: Nurse Practitioner

## 2022-08-15 ENCOUNTER — Telehealth: Payer: Self-pay

## 2022-08-15 NOTE — Telephone Encounter (Signed)
Faxed signed orders to [facility] as per Dr. Mosetta Putt. Received fax confirmation of receipt. Placed original in the to be scanned file.

## 2022-08-16 ENCOUNTER — Ambulatory Visit
Admission: RE | Admit: 2022-08-16 | Discharge: 2022-08-16 | Disposition: A | Discharge status: Home / Self Care | Payer: Medicare HMO | Source: Ambulatory Visit | Attending: Hematology | Admitting: Hematology

## 2022-08-16 DIAGNOSIS — Z170 Estrogen receptor positive status [ER+]: Secondary | ICD-10-CM

## 2022-08-16 HISTORY — DX: Personal history of irradiation: Z92.3

## 2022-09-22 NOTE — Radiation Completion Notes (Signed)
Patient Name: Cynthia Mccullough, Cynthia Mccullough MRN: [id number] Date of Birth: Oct 20, 1950 Referring Physician: Antony Haste, M.D. Date of Service: 2022-09-22 Radiation Oncologist: Dorothy Puffer, M.D. [facility]                             RADIATION ONCOLOGY END OF TREATMENT NOTE     Diagnosis: C50.411 Malignant neoplasm of upper-outer quadrant of right female breast Staging on 2022-01-09: Malignant neoplasm of upper-outer quadrant of right breast in female, estrogen receptor positive (HCC) T=pT1b, N=pN0, M=cM0 Staging on 2021-12-19: Malignant neoplasm of upper-outer quadrant of right breast in female, estrogen receptor positive (HCC) T=cT1a, N=cN0, M=cM0 Intent: Curative     ==========DELIVERED PLANS==========  First Treatment Date: 2022-02-20 - Last Treatment Date: 2022-03-17   Plan Name: Breast_R Site: Breast, Right Technique: 3D Mode: Photon Dose Per Fraction: 2.66 Gy Prescribed Dose (Delivered / Prescribed): 42.56 Gy / 42.56 Gy Prescribed Fxs (Delivered / Prescribed): 16 / 16   Plan Name: Breast_R_Bst Site: Breast, Right Technique: 3D Mode: Photon Dose Per Fraction: 2 Gy Prescribed Dose (Delivered / Prescribed): 8 Gy / 8 Gy Prescribed Fxs (Delivered / Prescribed): 4 / 4     ==========ON TREATMENT VISIT DATES========== 2022-02-24, 2022-03-02, 2022-03-10, 2022-03-17     ==========UPCOMING VISITS==========       ==========APPENDIX - ON TREATMENT VISIT NOTES==========   See weekly On Treatment Notes in Epic for details.

## 2022-10-03 ENCOUNTER — Inpatient Hospital Stay: Payer: Medicare HMO | Attending: Nurse Practitioner | Admitting: Nurse Practitioner

## 2022-10-03 ENCOUNTER — Encounter: Payer: Self-pay | Admitting: Nurse Practitioner

## 2022-10-03 VITALS — BP 135/71 | HR 79 | Temp 99.3°F | Resp 19 | Wt 189.1 lb

## 2022-10-03 DIAGNOSIS — Z800 Family history of malignant neoplasm of digestive organs: Secondary | ICD-10-CM | POA: Insufficient documentation

## 2022-10-03 DIAGNOSIS — Z170 Estrogen receptor positive status [ER+]: Secondary | ICD-10-CM | POA: Insufficient documentation

## 2022-10-03 DIAGNOSIS — Z803 Family history of malignant neoplasm of breast: Secondary | ICD-10-CM | POA: Insufficient documentation

## 2022-10-03 DIAGNOSIS — Z79811 Long term (current) use of aromatase inhibitors: Secondary | ICD-10-CM | POA: Insufficient documentation

## 2022-10-03 DIAGNOSIS — Z122 Encounter for screening for malignant neoplasm of respiratory organs: Secondary | ICD-10-CM | POA: Diagnosis not present

## 2022-10-03 DIAGNOSIS — C50411 Malignant neoplasm of upper-outer quadrant of right female breast: Secondary | ICD-10-CM | POA: Insufficient documentation

## 2022-10-03 MED ORDER — EXEMESTANE 25 MG PO TABS: 25.0000 mg | ORAL_TABLET | Freq: Every day | ORAL | 3 refills | Status: DC

## 2022-10-03 NOTE — Progress Notes (Signed)
CLINIC:  Survivorship   REASON FOR VISIT:  Routine follow-up post-treatment for a recent history of breast cancer.  BRIEF ONCOLOGIC HISTORY:  Oncology History Overview Note   Cancer Staging  Malignant neoplasm of upper-outer quadrant of right breast in female, estrogen receptor positive (HCC) Staging form: Breast, AJCC 8th Edition - Clinical stage from 12/19/2021: cT1a, cN0, cM0, G2, ER+, PR+ - Unsigned Stage prefix: Initial diagnosis Method of lymph node assessment: Clinical Histologic grading system: 3 grade system - Pathologic stage from 01/09/2022: Stage IA (pT1b, pN0(sn), cM0, G2, ER+, PR+, HER2-) - Signed by Pollyann Samples, NP on 03/21/2022 Stage prefix: Initial diagnosis Method of lymph node assessment: Sentinel lymph node biopsy Histologic grading system: 3 grade system     Malignant neoplasm of upper-outer quadrant of right breast in female, estrogen receptor positive (HCC)  12/03/2021 Imaging    IMPRESSION: Suspicious 0.5 cm mass in the right breast at the 10 o'clock position.   12/19/2021 Initial Diagnosis   Malignant neoplasm of upper-outer quadrant of right breast in female, estrogen receptor positive [facility])    Imaging      Genetic Testing   [facility] CancerNext-Expanded Panel+RNA was Negative. Report date is 12/30/2021.  The CancerNext-Expanded gene panel offered by [facility] and includes sequencing, rearrangement, and RNA analysis for the following 77 genes: AIP, ALK, APC, ATM, AXIN2, BAP1, BARD1, BLM, BMPR1A, BRCA1, BRCA2, BRIP1, CDC73, CDH1, CDK4, CDKN1B, CDKN2A, CHEK2, CTNNA1, DICER1, FANCC, FH, FLCN, GALNT12, KIF1B, LZTR1, MAX, MEN1, MET, MLH1, MSH2, MSH3, MSH6, MUTYH, NBN, NF1, NF2, NTHL1, PALB2, PHOX2B, PMS2, POT1, PRKAR1A, PTCH1, PTEN, RAD51C, RAD51D, RB1, RECQL, RET, SDHA, SDHAF2, SDHB, SDHC, SDHD, SMAD4, SMARCA4, SMARCB1, SMARCE1, STK11, SUFU, TMEM127, TP53, TSC1, TSC2, VHL and XRCC2 (sequencing and deletion/duplication); EGFR, EGLN1, HOXB13, KIT, MITF,  PDGFRA, POLD1, and POLE (sequencing only); EPCAM and GREM1 (deletion/duplication only).    01/09/2022 Cancer Staging   Staging form: Breast, AJCC 8th Edition - Pathologic stage from 01/09/2022: Stage IA (pT1b, pN0(sn), cM0, G2, ER+, PR+, HER2-) - Signed by Pollyann Samples, NP on 03/21/2022 Stage prefix: Initial diagnosis Method of lymph node assessment: Sentinel lymph node biopsy Histologic grading system: 3 grade system   01/09/2022 Pathology Results   FINAL MICROSCOPIC DIAGNOSIS: A. BREAST, RIGHT W/SEED, LUMPECTOMY: - Invasive lobular carcinoma, pT1b, pN0 - Lobular carcinoma in situ - Biopsy site changes present - All margins are negative for carcinoma in situ or invasive carcinoma - See oncology table  B. SENTINEL LYMPH NODE, RIGHT AXILLARY, BIOPSY: - Negative for carcinoma (0/1)       02/20/2022 - 03/17/2022 Radiation Therapy   Intent: Curative Radiation Treatment Dates: 02/20/2022 through 03/17/2022 Site Technique Total Dose (Gy) Dose per Fx (Gy) Completed Fx Beam Energies  Breast, Right: Breast_R 3D 42.56/42.56 2.66 16/16 10X  Breast, Right: Breast_R_Bst 3D 8/8 2 4/4 6X, 10X      04/2022 -  Anti-estrogen oral therapy   Began adjuvant tamoxifen    07/2022 -  Anti-estrogen oral therapy   Changed to Exemestane (due to effects with tamoxifen/plaquenil)   10/03/2022 Survivorship   SCP delivered by Santiago Glad, NP     INTERVAL HISTORY:  Cynthia Mccullough presents to the Survivorship Clinic today for our initial meeting to review her survivorship care plan detailing her treatment course for breast cancer, as well as monitoring long-term side effects of that treatment, education regarding health maintenance, screening, and overall wellness and health promotion.     Overall, Cynthia Mccullough reports feeling well.  Still with some firmness in  the right breast, but no new lump/mass, nipple discharge or inversion, or skin change.  She is tolerating exemestane with occasional sweating at night  around once per week.  Denies bone or joint pain.  She would like to quit smoking, has nicotine products but has not started yet.    REVIEW OF SYSTEMS:  Review of Systems - Oncology Breast: Denies any new nodularity, masses, tenderness, nipple changes, or nipple discharge.      ONCOLOGY TREATMENT TEAM:  1. Surgeon:  Dr. Magnus Ivan at [facility] 2. Medical Oncologist: Dr. Mosetta Putt 3. Radiation Oncologist: Dr. Mitzi Hansen    PAST MEDICAL/SURGICAL HISTORY:  Past Medical History:  Diagnosis Date   Allergic rhinitis    Anxiety    Asthma    Breast cancer (HCC)    Depression    DES exposure in utero    Essential tremor    HLD (hyperlipidemia)    HTN (hypertension)    Kidney stones    Panic attacks    Personal history of radiation therapy    Past Surgical History:  Procedure Laterality Date   ABDOMINAL HYSTERECTOMY     BREAST BIOPSY Right 12/13/2021   [place] RT BREAST BX W LOC DEV 1ST LESION IMG BX SPEC US GUIDE 12/13/2021 [facility]   BREAST BIOPSY  01/05/2022   MM RT RADIOACTIVE SEED LOC MAMMO GUIDE 01/05/2022 [facility] MAMMOGRAPHY   BREAST LUMPECTOMY Right 01/09/2022   BREAST LUMPECTOMY WITH RADIOACTIVE SEED AND SENTINEL LYMPH NODE BIOPSY Right 01/09/2022   Procedure: RIGHT BREAST LUMPECTOMY WITH RADIOACTIVE SEED AND SENTINEL LYMPH NODE BIOPSY;  Surgeon: Abigail Miyamoto, MD;  Location: [facility];  Service: General;  Laterality: Right;   CERVICAL ABLATION     DILATION AND CURETTAGE OF UTERUS     EXCISION MORTON'S NEUROMA Right 01/02/2006   FOOT SURGERY     LAPAROSCOPIC ASSISTED VAGINAL HYSTERECTOMY N/A 04/23/2013   Procedure: LAPAROSCOPIC ASSISTED VAGINAL HYSTERECTOMY;  Surgeon: Jeani Hawking, MD;  Location: [facility];  Service: Gynecology;  Laterality: N/A;   LIPOSUCTION     SALPINGOOPHORECTOMY Bilateral 04/23/2013   Procedure: SALPINGO OOPHORECTOMY;  Surgeon: Jeani Hawking, MD;  Location: [facility];  Service: Gynecology;  Laterality: Bilateral;      ALLERGIES:  Allergies  Allergen Reactions   Amoxicillin Hives   Metronidazole Hives   Penicillins Anaphylaxis    Has patient had a PCN reaction causing immediate rash, facial/tongue/throat swelling, SOB or lightheadedness with hypotension: Yes Has patient had a PCN reaction causing severe rash involving mucus membranes or skin necrosis: No Has patient had a PCN reaction that required hospitalization: Yes Has patient had a PCN reaction occurring within the last 10 years: No If all of the above answers are "NO", then may proceed with Cephalosporin use.   Sulfonamide Derivatives Hives    REACTION: urticaria   Cheese Hives    Reaction to aged cheeses - blue, gorgonzola   Atorvastatin Other (See Comments)    Unknown reaction   Rosuvastatin Itching     CURRENT MEDICATIONS:  Outpatient Encounter Medications as of 10/03/2022  Medication Sig   albuterol (PROVENTIL HFA;VENTOLIN HFA) 108 (90 Base) MCG/ACT inhaler Inhale 2 puffs into the lungs every 4 (four) hours as needed for wheezing or shortness of breath.   allopurinol (ZYLOPRIM) 100 MG tablet Take 100 mg by mouth 3 (three) times daily.   amLODipine (NORVASC) 5 MG tablet Take 1 tablet (5 mg total) by mouth daily.   BEELITH 362-20 MG TABS tablet Take 1 tablet by  mouth daily.   budesonide (ENTOCORT EC) 3 MG 24 hr capsule Take 9 mg by mouth daily.   cetirizine (ZYRTEC) 10 MG tablet Take 10 mg by mouth at bedtime.    fenofibrate 160 MG tablet Take 160 mg by mouth at bedtime.    hydroxychloroquine (PLAQUENIL) 200 MG tablet Take 200 mg by mouth daily.   latanoprost (XALATAN) 0.005 % ophthalmic solution Place 1 drop into both eyes at bedtime.   levothyroxine (SYNTHROID, LEVOTHROID) 50 MCG tablet Take 50 mcg by mouth daily before breakfast.   nebivolol (BYSTOLIC) 10 MG tablet Take 10 mg by mouth at bedtime.   pantoprazole (PROTONIX) 40 MG tablet Take 40 mg by mouth daily.   traZODone (DESYREL) 100 MG tablet Take 1 tablet (100 mg total) by  mouth at bedtime.   Vilazodone HCl (VIIBRYD) 40 MG TABS Take 40 mg by mouth daily.    zaleplon (SONATA) 10 MG capsule Take 2 capsules by mouth at bedtime.   [DISCONTINUED] exemestane (AROMASIN) 25 MG tablet Take 1 tablet (25 mg total) by mouth daily after breakfast.   ALPRAZolam (XANAX) 0.5 MG tablet Take 0.5-0.75 mg by mouth at bedtime. (Patient not taking: Reported on 10/03/2022)   exemestane (AROMASIN) 25 MG tablet Take 1 tablet (25 mg total) by mouth daily after breakfast.   nystatin (MYCOSTATIN/NYSTOP) powder Apply 1 Application topically 2 (two) times daily. To skin rashes underneath breasts, as needed (Patient not taking: Reported on 10/03/2022)   No facility-administered encounter medications on file as of 10/03/2022.     ONCOLOGIC FAMILY HISTORY:  Family History  Problem Relation Age of Onset   Breast cancer Mother 62   Heart attack Father    Coronary artery disease Father    Hypertension Father    Diabetes Father    Prostate cancer Father 74 - 91   Breast cancer Maternal Grandmother 78 - 59   Liver cancer Paternal Grandfather      GENETIC COUNSELING/TESTING: Yes, negative  SOCIAL HISTORY:  Cynthia Mccullough is widowed.  Ms. Roldan is not currently working.  She smoked cigarettes for 28 years then quit for many years and has restarted smoking again since her husband passed away.      PHYSICAL EXAMINATION:  Vital Signs:   Vitals:   10/03/22 1212  BP: 135/71  Pulse: 79  Resp: 19  Temp: 99.3 F (37.4 C)  SpO2: 95%   Filed Weights   10/03/22 1212  Weight: 189 lb 2 oz (85.8 kg)   General: Well-appearing female in no acute distress. HEENT:  Sclerae anicteric.  Lymph: No cervical, supraclavicular, or infraclavicular lymphadenopathy noted on palpation.  Cardiovascular: Regular rate and rhythm. Respiratory: Clear; breathing non-labored.  GI: Abdomen soft and round; non-tender, non-distended. Bowel sounds normoactive.  Neuro: Steady gait.  Psych: Mood and  affect normal and appropriate for situation.  Extremities: No edema. MSK: No focal spinal tenderness to palpation.  Full range of motion in bilateral upper extremities Skin: Warm and dry. Breast exam: S/p right lumpectomy and radiation, incisions completely healed with mild scar tissue and what I feel is a small area of dye to the right of the areola/ right breast.  No palpable mass or nodularity in either breast or axilla that I could appreciate.  LABORATORY DATA:  None for this visit.  DIAGNOSTIC IMAGING:  None for this visit.      ASSESSMENT AND PLAN:  Ms.. Mccullough is a pleasant 72 y.o. 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  She presents to the Survivorship Clinic for our initial meeting and routine follow-up post-completion of treatment for breast cancer.    1. Stage 1A right breast cancer:  Cynthia Mccullough has recovered well from definitive treatment for breast cancer. She will follow-up with her medical oncologist, Dr. Mosetta Putt in 3 months.  She will continue her anti-estrogen therapy with exemestane. Thus far, she is tolerating well, with minimal side effects. She was instructed to make Dr. Mosetta Putt or myself aware if she begins to experience any worsening side effects of the medication and I could see her back in clinic to help manage those side effects, as needed. Today, a comprehensive survivorship care plan and treatment summary was reviewed with the patient today detailing her breast cancer diagnosis, treatment course, potential late/long-term effects of treatment, appropriate follow-up care with recommendations for the future, and patient education resources.  A copy of this summary, along with a letter will be sent to the patient's primary care provider via mail/fax/In Basket  message after today's visit.     2. Bone health:  Given Cynthia Mccullough age/history of breast cancer and her current treatment regimen including anti-estrogen therapy with exemestane, she is at risk for bone demineralization.  Her last DEXA scan was 04/2022 which showed osteopenia with high frax score. We discussed zometa. She is interested but needs dental work. She plans to proceed with that and we will obtain dental clearance once complete. In the mean time I recommend to maximize calcium/vit D and weight bearing exercise.   3. Cancer screening:  Due to Cynthia Mccullough history and her age, she should receive screening for skin cancers, colon cancer, and gynecologic cancers. Still follows with Dr. Vincente Poli, will defer screenings to her. Qualifies for lung cancer screening CT, I have placed the order. The information and recommendations are listed on the patient's comprehensive care plan/treatment summary and were reviewed in detail with the patient.    4. Health maintenance and wellness promotion: Cynthia Mccullough was encouraged to consume 5-7 servings of fruits and vegetables per day. She was also encouraged to engage in moderate to vigorous exercise for 30 minutes per day most days of the week.  She was instructed to limit her alcohol consumption and was encouraged stop smoking.  She will try to quit on her own. She is aware of a smoking cessation program I will refer her if needed.    5. Support services/counseling: It is not uncommon for this period of the patient's cancer care trajectory to be one of many emotions and stressors.   Cynthia Mccullough was encouraged to take advantage of our many other support services programs, support groups, and/or counseling in coping with her new life as a cancer survivor after completing anti-cancer treatment.  She was offered support today through active listening and expressive supportive counseling.  She was given information regarding our available services and  encouraged to contact me with any questions or for help enrolling in any of our support group/programs.    Dispo:   -Lung cancer screening CT in 1-4 weeks, will call with results -Mammogram 12/2022, orders placed today -Continue Exemestane, refilled  -Return to cancer center 01/23/23 as scheduled  -Follow up with surgery as indicated  She is welcome to return back to the Survivorship Clinic at any time; no additional follow-up needed at this time. Consider referral back to survivorship  as a long-term survivor for continued surveillance   Orders Placed This Encounter  Procedures   CT CHEST LUNG CA SCREEN LOW DOSE W/O CM    Standing Status:   Future    Standing Expiration Date:   10/03/2023    Order Specific Question:   Preferred Imaging Location?    Answer:   [facility]   MM DIAG BREAST TOMO BILATERAL    Standing Status:   Future    Standing Expiration Date:   10/03/2023    Order Specific Question:   Reason for Exam (SYMPTOM  OR DIAGNOSIS REQUIRED)    Answer:   R breast cancer s/p lump/RT/AI    Order Specific Question:   Preferred imaging location?    Answer:   [facility]    A total of (30) minutes of face-to-face time was spent with this patient with greater than 50% of that time in counseling and care-coordination.   Santiago Glad, NP Survivorship Program [facility] [phone]   Note: PRIMARY CARE PROVIDER Eartha Inch, MD [phone]

## 2022-10-24 ENCOUNTER — Telehealth: Payer: Medicare HMO | Admitting: Hematology

## 2022-11-03 ENCOUNTER — Encounter (HOSPITAL_COMMUNITY): Payer: Self-pay

## 2022-11-03 ENCOUNTER — Ambulatory Visit (HOSPITAL_COMMUNITY)
Admission: RE | Admit: 2022-11-03 | Discharge: 2022-11-03 | Disposition: A | Discharge status: Home / Self Care | Payer: Medicare HMO | Source: Ambulatory Visit | Attending: Nurse Practitioner

## 2022-11-03 DIAGNOSIS — Z122 Encounter for screening for malignant neoplasm of respiratory organs: Secondary | ICD-10-CM

## 2022-12-05 ENCOUNTER — Ambulatory Visit
Admission: RE | Admit: 2022-12-05 | Discharge: 2022-12-05 | Disposition: A | Discharge status: Home / Self Care | Payer: Medicare HMO | Source: Ambulatory Visit | Attending: Nurse Practitioner | Admitting: Nurse Practitioner

## 2022-12-05 DIAGNOSIS — Z170 Estrogen receptor positive status [ER+]: Secondary | ICD-10-CM

## 2023-01-23 ENCOUNTER — Other Ambulatory Visit: Payer: Medicare HMO

## 2023-01-23 ENCOUNTER — Ambulatory Visit: Payer: Medicare HMO | Admitting: Nurse Practitioner

## 2023-02-15 NOTE — Progress Notes (Signed)
Patient Care Team: Vladimir Faster as PCP - General (Physician Assistant) Donnelly Angelica, RN as Oncology Nurse Navigator Pershing Proud, RN as Oncology Nurse Navigator Abigail Miyamoto, MD as Consulting Physician (General Surgery) Malachy Mood, MD as Consulting Physician (Hematology) Dorothy Puffer, MD as Consulting Physician (Radiation Oncology)   CHIEF COMPLAINT: Follow up right breast cancer    Oncology History Overview Note   Cancer Staging  Malignant neoplasm of upper-outer quadrant of right breast in female, estrogen receptor positive [facility]) Staging form: Breast, AJCC 8th Edition - Clinical stage from 12/19/2021: cT1a, cN0, cM0, G2, ER+, PR+ - Unsigned Stage prefix: Initial diagnosis Method of lymph node assessment: Clinical Histologic grading system: 3 grade system - Pathologic stage from 01/09/2022: Stage IA (pT1b, pN0(sn), cM0, G2, ER+, PR+, HER2-) - Signed by Pollyann Samples, NP on 03/21/2022 Stage prefix: Initial diagnosis Method of lymph node assessment: Sentinel lymph node biopsy Histologic grading system: 3 grade system     Malignant neoplasm of upper-outer quadrant of right breast in female, estrogen receptor positive (HCC)  12/03/2021 Imaging    IMPRESSION: Suspicious 0.5 cm mass in the right breast at the 10 o'clock position.   12/19/2021 Initial Diagnosis   Malignant neoplasm of upper-outer quadrant of right breast in female, estrogen receptor positive [facility])    Imaging      Genetic Testing   [facility] CancerNext-Expanded Panel+RNA was Negative. Report date is 12/30/2021.  The CancerNext-Expanded gene panel offered by [facility] and includes sequencing, rearrangement, and RNA analysis for the following 77 genes: AIP, ALK, APC, ATM, AXIN2, BAP1, BARD1, BLM, BMPR1A, BRCA1, BRCA2, BRIP1, CDC73, CDH1, CDK4, CDKN1B, CDKN2A, CHEK2, CTNNA1, DICER1, FANCC, FH, FLCN, GALNT12, KIF1B, LZTR1, MAX, MEN1, MET, MLH1, MSH2, MSH3, MSH6, MUTYH, NBN, NF1, NF2, NTHL1,  PALB2, PHOX2B, PMS2, POT1, PRKAR1A, PTCH1, PTEN, RAD51C, RAD51D, RB1, RECQL, RET, SDHA, SDHAF2, SDHB, SDHC, SDHD, SMAD4, SMARCA4, SMARCB1, SMARCE1, STK11, SUFU, TMEM127, TP53, TSC1, TSC2, VHL and XRCC2 (sequencing and deletion/duplication); EGFR, EGLN1, HOXB13, KIT, MITF, PDGFRA, POLD1, and POLE (sequencing only); EPCAM and GREM1 (deletion/duplication only).    01/09/2022 Cancer Staging   Staging form: Breast, AJCC 8th Edition - Pathologic stage from 01/09/2022: Stage IA (pT1b, pN0(sn), cM0, G2, ER+, PR+, HER2-) - Signed by Pollyann Samples, NP on 03/21/2022 Stage prefix: Initial diagnosis Method of lymph node assessment: Sentinel lymph node biopsy Histologic grading system: 3 grade system   01/09/2022 Pathology Results   FINAL MICROSCOPIC DIAGNOSIS: A. BREAST, RIGHT W/SEED, LUMPECTOMY: - Invasive lobular carcinoma, pT1b, pN0 - Lobular carcinoma in situ - Biopsy site changes present - All margins are negative for carcinoma in situ or invasive carcinoma - See oncology table  B. SENTINEL LYMPH NODE, RIGHT AXILLARY, BIOPSY: - Negative for carcinoma (0/1)       02/20/2022 - 03/17/2022 Radiation Therapy   Intent: Curative Radiation Treatment Dates: 02/20/2022 through 03/17/2022 Site Technique Total Dose (Gy) Dose per Fx (Gy) Completed Fx Beam Energies  Breast, Right: Breast_R 3D 42.56/42.56 2.66 16/16 10X  Breast, Right: Breast_R_Bst 3D 8/8 2 4/4 6X, 10X      04/2022 -  Anti-estrogen oral therapy   Began adjuvant tamoxifen    07/2022 -  Anti-estrogen oral therapy   Changed to Exemestane (due to effects with tamoxifen/plaquenil)   10/03/2022 Survivorship   SCP delivered by Santiago Glad, NP      CURRENT THERAPY: Tamoxifen starting 05/2022. Changed to Exemestane 07/2022 due to risk of retinal detachment on tamoxifen/plaquenil   INTERVAL HISTORY Ms. Kuriakose  returns for follow up as scheduled. Last seen 07/2022 with phone visit 10/2022. Mammogram 12/05/22 was benign, breast density cat C. She  continues exemestane, tolerating well except occasional severe hot flash at night. Recently had to change sheets/clothes twice in one night. Denies breast concerns. She recently had a sinus infection and has traveled to [place] where her mother is in Hospice. Then last week she was getting dressed for a funeral and fell, badly bruised her tailbone.   ROS  All other systems reviewed and negative  Past Medical History:  Diagnosis Date   Allergic rhinitis    Anxiety    Asthma    Breast cancer (HCC)    Depression    DES exposure in utero    Essential tremor    HLD (hyperlipidemia)    HTN (hypertension)    Kidney stones    Panic attacks    Personal history of radiation therapy      Past Surgical History:  Procedure Laterality Date   ABDOMINAL HYSTERECTOMY     BREAST BIOPSY Right 12/13/2021   [place] RT BREAST BX W LOC DEV 1ST LESION IMG BX SPEC US GUIDE 12/13/2021 [facility] MAMMOGRAPHY   BREAST BIOPSY  01/05/2022   MM RT RADIOACTIVE SEED LOC MAMMO GUIDE 01/05/2022 [facility] MAMMOGRAPHY   BREAST LUMPECTOMY Right 01/09/2022   BREAST LUMPECTOMY WITH RADIOACTIVE SEED AND SENTINEL LYMPH NODE BIOPSY Right 01/09/2022   Procedure: RIGHT BREAST LUMPECTOMY WITH RADIOACTIVE SEED AND SENTINEL LYMPH NODE BIOPSY;  Surgeon: Abigail Miyamoto, MD;  Location: [facility];  Service: General;  Laterality: Right;   CERVICAL ABLATION     DILATION AND CURETTAGE OF UTERUS     EXCISION MORTON'S NEUROMA Right 01/02/2006   FOOT SURGERY     LAPAROSCOPIC ASSISTED VAGINAL HYSTERECTOMY N/A 04/23/2013   Procedure: LAPAROSCOPIC ASSISTED VAGINAL HYSTERECTOMY;  Surgeon: Jeani Hawking, MD;  Location: [facility];  Service: Gynecology;  Laterality: N/A;   LIPOSUCTION     REDUCTION MAMMAPLASTY Left    SALPINGOOPHORECTOMY Bilateral 04/23/2013   Procedure: SALPINGO OOPHORECTOMY;  Surgeon: Jeani Hawking, MD;  Location: [facility];  Service: Gynecology;  Laterality: Bilateral;     Outpatient Encounter Medications as of  02/19/2023  Medication Sig   albuterol (PROVENTIL HFA;VENTOLIN HFA) 108 (90 Base) MCG/ACT inhaler Inhale 2 puffs into the lungs every 4 (four) hours as needed for wheezing or shortness of breath.   allopurinol (ZYLOPRIM) 100 MG tablet Take 100 mg by mouth 3 (three) times daily.   ALPRAZolam (XANAX) 0.5 MG tablet Take 0.5-0.75 mg by mouth at bedtime.   amLODipine (NORVASC) 5 MG tablet Take 1 tablet (5 mg total) by mouth daily.   BEELITH 362-20 MG TABS tablet Take 1 tablet by mouth daily.   budesonide (ENTOCORT EC) 3 MG 24 hr capsule Take 9 mg by mouth daily.   cetirizine (ZYRTEC) 10 MG tablet Take 10 mg by mouth at bedtime.    exemestane (AROMASIN) 25 MG tablet Take 1 tablet (25 mg total) by mouth daily after breakfast.   fenofibrate 160 MG tablet Take 160 mg by mouth at bedtime.    gabapentin (NEURONTIN) 100 MG capsule Take 1 capsule (100 mg total) by mouth at bedtime.   hydroxychloroquine (PLAQUENIL) 200 MG tablet Take 200 mg by mouth daily.   latanoprost (XALATAN) 0.005 % ophthalmic solution Place 1 drop into both eyes at bedtime.   levothyroxine (SYNTHROID, LEVOTHROID) 50 MCG tablet Take 50 mcg by mouth daily before breakfast.   nebivolol (BYSTOLIC) 10 MG tablet  Take 10 mg by mouth at bedtime.   pantoprazole (PROTONIX) 40 MG tablet Take 40 mg by mouth daily.   traZODone (DESYREL) 100 MG tablet Take 1 tablet (100 mg total) by mouth at bedtime.   Vilazodone HCl (VIIBRYD) 40 MG TABS Take 40 mg by mouth daily.    zaleplon (SONATA) 10 MG capsule Take 2 capsules by mouth at bedtime.   nystatin (MYCOSTATIN/NYSTOP) powder Apply 1 Application topically 2 (two) times daily. To skin rashes underneath breasts, as needed (Patient not taking: Reported on 02/19/2023)   No facility-administered encounter medications on file as of 02/19/2023.     Today's Vitals   02/19/23 1134  BP: 132/66  Pulse: 73  Resp: 16  Temp: 97.8 F (36.6 C)  TempSrc: Temporal  SpO2: 98%  Weight: 188 lb (85.3 kg)   Body  mass index is 28.59 kg/m.   PHYSICAL EXAM GENERAL:alert, no distress and comfortable SKIN: no rash  EYES: sclera clear NECK: without mass LYMPH:  no palpable cervical or supraclavicular lymphadenopathy  LUNGS: normal breathing effort HEART:  no lower extremity edema ABDOMEN: abdomen soft, non-tender and normal bowel sounds NEURO: alert & oriented x 3 with fluent speech, no focal motor/sensory deficits Breast exam: No nipple discharge or inversion.  S/p right lumpectomy, mild scar tissue in the axilla, incisions completely healed.  No palpable mass or nodularity in either breast or axilla that I could appreciate    CBC    Component Value Date/Time   WBC 4.4 02/19/2023 1105   WBC 5.7 03/13/2020 0515   RBC 3.71 (L) 02/19/2023 1105   HGB 12.9 02/19/2023 1105   HCT 39.1 02/19/2023 1105   PLT 225 02/19/2023 1105   MCV 105.4 (H) 02/19/2023 1105   MCH 34.8 (H) 02/19/2023 1105   MCHC 33.0 02/19/2023 1105   RDW 13.0 02/19/2023 1105   LYMPHSABS 1.1 02/19/2023 1105   MONOABS 0.5 02/19/2023 1105   EOSABS 0.1 02/19/2023 1105   BASOSABS 0.1 02/19/2023 1105     CMP     Component Value Date/Time   NA 138 02/19/2023 1105   K 4.4 02/19/2023 1105   CL 101 02/19/2023 1105   CO2 32 02/19/2023 1105   GLUCOSE 88 02/19/2023 1105   BUN 17 02/19/2023 1105   CREATININE 0.85 02/19/2023 1105   CALCIUM 9.0 02/19/2023 1105   PROT 6.1 (L) 02/19/2023 1105   ALBUMIN 3.8 02/19/2023 1105   AST 19 02/19/2023 1105   ALT 16 02/19/2023 1105   ALKPHOS 32 (L) 02/19/2023 1105   BILITOT 0.4 02/19/2023 1105   GFRNONAA >60 02/19/2023 1105   GFRAA >60 06/10/2018 0038     ASSESSMENT & PLAN:Ardyth M Crupi is a 73 y.o. postmenopausal woman, presented with screening discovered right breast cancer    1.  Malignant neoplasm of upper outer quadrant of right breast, estrogen receptor positive, invasive lobular carcinoma and LCIS, pT1bN0M0 stage IA, grade 2 -Diagnosed 12/2021, s/p right lumpectomy by Dr.  Magnus Ivan, path showed 0.9 cm ILC and LCIS, SLN negative, and clear margins. Due to small size, oncotype was not done -S/p adjuvant radiation by Dr. Mitzi Hansen 02/20/22 - 03/17/22, she had diffuse erythema and tenderness but has recovered well -Began adjuvant Tamoxifen in 04/2022, changed to exemestane in 07/2022 due to DDI with Plaquenil.  -Ms. Capobianco is clinically doing well.  Tolerating exemestane except occasional severe nighttime hot flash, she is requesting low-dose gabapentin, reviewed tensional side effects and prescribed to start at a low dose -Breast exam is benign, labs  are stable, December mammogram is negative.  Overall no clinical concern for recurrence -Continue breast cancer surveillance and AI -Follow-up in 6 months, or sooner if needed   2. Bone Health -DEXA shows mild osteopenia, high FRAX score.  I previously recommended Zometa.  She has dental work that needs to be done but she does not want to proceed during flu season -Recently fell on her tailbone, feels bruised but slowly improving; hold x-ray for now. Encouraged to avoid subsequent fall -Continue calcium, vitamin D, and weightbearing  3. Health maintenance  -F/up PCP and Gyn   PLAN: -Recent mammogram and today's labs reviewed -Continue breast cancer surveillance and exemestane -Rx gabapentin 100 mg nightly for vasomotor symptoms/hot flashes -Follow-up in 6 months, or sooner if needed   All questions were answered. The patient knows to call the clinic with any problems, questions or concerns. No barriers to learning were detected. I spent 20 minutes counseling the patient face to face. The total time spent in the appointment was 30 minutes and more than 50% was on counseling, review of test results, and coordination of care.   Santiago Glad, NP-C 02/19/2023

## 2023-02-19 ENCOUNTER — Inpatient Hospital Stay: Payer: Medicare HMO | Attending: Nurse Practitioner

## 2023-02-19 ENCOUNTER — Encounter: Payer: Self-pay | Admitting: Nurse Practitioner

## 2023-02-19 ENCOUNTER — Inpatient Hospital Stay: Payer: Medicare HMO | Admitting: Nurse Practitioner

## 2023-02-19 VITALS — BP 132/66 | HR 73 | Temp 97.8°F | Resp 16 | Wt 188.0 lb

## 2023-02-19 DIAGNOSIS — Z800 Family history of malignant neoplasm of digestive organs: Secondary | ICD-10-CM | POA: Diagnosis not present

## 2023-02-19 DIAGNOSIS — Z170 Estrogen receptor positive status [ER+]: Secondary | ICD-10-CM | POA: Diagnosis not present

## 2023-02-19 DIAGNOSIS — C50411 Malignant neoplasm of upper-outer quadrant of right female breast: Secondary | ICD-10-CM

## 2023-02-19 DIAGNOSIS — Z803 Family history of malignant neoplasm of breast: Secondary | ICD-10-CM | POA: Insufficient documentation

## 2023-02-19 DIAGNOSIS — Z79811 Long term (current) use of aromatase inhibitors: Secondary | ICD-10-CM | POA: Insufficient documentation

## 2023-02-19 LAB — CBC WITH DIFFERENTIAL (CANCER CENTER ONLY)
Abs Immature Granulocytes: 0.01 10*3/uL (ref 0.00–0.07)
Basophils Absolute: 0.1 10*3/uL (ref 0.0–0.1)
Basophils Relative: 1 %
Eosinophils Absolute: 0.1 10*3/uL (ref 0.0–0.5)
Eosinophils Relative: 3 %
HCT: 39.1 % (ref 36.0–46.0)
Hemoglobin: 12.9 g/dL (ref 12.0–15.0)
Immature Granulocytes: 0 %
Lymphocytes Relative: 25 %
Lymphs Abs: 1.1 10*3/uL (ref 0.7–4.0)
MCH: 34.8 pg — ABNORMAL HIGH (ref 26.0–34.0)
MCHC: 33 g/dL (ref 30.0–36.0)
MCV: 105.4 fL — ABNORMAL HIGH (ref 80.0–100.0)
Monocytes Absolute: 0.5 10*3/uL (ref 0.1–1.0)
Monocytes Relative: 11 %
Neutro Abs: 2.6 10*3/uL (ref 1.7–7.7)
Neutrophils Relative %: 60 %
Platelet Count: 225 10*3/uL (ref 150–400)
RBC: 3.71 MIL/uL — ABNORMAL LOW (ref 3.87–5.11)
RDW: 13 % (ref 11.5–15.5)
WBC Count: 4.4 10*3/uL (ref 4.0–10.5)
nRBC: 0 % (ref 0.0–0.2)

## 2023-02-19 LAB — CMP (CANCER CENTER ONLY)
ALT: 16 U/L (ref 0–44)
AST: 19 U/L (ref 15–41)
Albumin: 3.8 g/dL (ref 3.5–5.0)
Alkaline Phosphatase: 32 U/L — ABNORMAL LOW (ref 38–126)
Anion gap: 5 (ref 5–15)
BUN: 17 mg/dL (ref 8–23)
CO2: 32 mmol/L (ref 22–32)
Calcium: 9 mg/dL (ref 8.9–10.3)
Chloride: 101 mmol/L (ref 98–111)
Creatinine: 0.85 mg/dL (ref 0.44–1.00)
GFR, Estimated: >60 mL/min (ref >60)
Glucose, Bld: 88 mg/dL (ref 70–99)
Potassium: 4.4 mmol/L (ref 3.5–5.1)
Sodium: 138 mmol/L (ref 135–145)
Total Bilirubin: 0.4 mg/dL (ref 0.0–1.2)
Total Protein: 6.1 g/dL — ABNORMAL LOW (ref 6.5–8.1)

## 2023-02-19 MED ORDER — GABAPENTIN 100 MG PO CAPS: 100.0000 mg | ORAL_CAPSULE | Freq: Every day | ORAL | 1 refills | Status: DC

## 2023-02-28 ENCOUNTER — Telehealth: Payer: Self-pay | Admitting: Nurse Practitioner

## 2023-02-28 NOTE — Telephone Encounter (Signed)
 Left patient a message in regards to rescheduled appointment times/dates; left call back number for scheduling line if patient is needing to make any changes or reschedule

## 2023-07-19 ENCOUNTER — Other Ambulatory Visit: Payer: Self-pay | Admitting: Hematology

## 2023-07-19 DIAGNOSIS — Z170 Estrogen receptor positive status [ER+]: Secondary | ICD-10-CM

## 2023-08-21 ENCOUNTER — Ambulatory Visit: Payer: Medicare HMO | Admitting: Nurse Practitioner

## 2023-08-21 ENCOUNTER — Other Ambulatory Visit: Payer: Medicare HMO

## 2023-08-21 ENCOUNTER — Other Ambulatory Visit: Payer: Self-pay

## 2023-08-21 DIAGNOSIS — Z170 Estrogen receptor positive status [ER+]: Secondary | ICD-10-CM

## 2023-08-22 ENCOUNTER — Encounter: Payer: Self-pay | Admitting: Nurse Practitioner

## 2023-08-22 ENCOUNTER — Inpatient Hospital Stay: Payer: Medicare HMO | Attending: Nurse Practitioner

## 2023-08-22 ENCOUNTER — Inpatient Hospital Stay (HOSPITAL_BASED_OUTPATIENT_CLINIC_OR_DEPARTMENT_OTHER): Payer: Medicare HMO | Admitting: Nurse Practitioner

## 2023-08-22 VITALS — BP 120/70 | HR 66 | Temp 98.2°F | Resp 17 | Ht 68.0 in | Wt 184.3 lb

## 2023-08-22 DIAGNOSIS — F418 Other specified anxiety disorders: Secondary | ICD-10-CM | POA: Diagnosis not present

## 2023-08-22 DIAGNOSIS — Z1721 Progesterone receptor positive status: Secondary | ICD-10-CM | POA: Insufficient documentation

## 2023-08-22 DIAGNOSIS — Z170 Estrogen receptor positive status [ER+]: Secondary | ICD-10-CM

## 2023-08-22 DIAGNOSIS — Z17411 Hormone receptor positive with human epidermal growth factor receptor 2 negative status: Secondary | ICD-10-CM | POA: Insufficient documentation

## 2023-08-22 DIAGNOSIS — C50411 Malignant neoplasm of upper-outer quadrant of right female breast: Secondary | ICD-10-CM | POA: Diagnosis not present

## 2023-08-22 DIAGNOSIS — G629 Polyneuropathy, unspecified: Secondary | ICD-10-CM | POA: Diagnosis not present

## 2023-08-22 DIAGNOSIS — Z79899 Other long term (current) drug therapy: Secondary | ICD-10-CM | POA: Insufficient documentation

## 2023-08-22 DIAGNOSIS — Z79811 Long term (current) use of aromatase inhibitors: Secondary | ICD-10-CM | POA: Insufficient documentation

## 2023-08-22 DIAGNOSIS — Z923 Personal history of irradiation: Secondary | ICD-10-CM | POA: Insufficient documentation

## 2023-08-22 LAB — CMP (CANCER CENTER ONLY)
ALT: 19 U/L (ref 0–44)
AST: 20 U/L (ref 15–41)
Albumin: 4.3 g/dL (ref 3.5–5.0)
Alkaline Phosphatase: 36 U/L — ABNORMAL LOW (ref 38–126)
Anion gap: 5 (ref 5–15)
BUN: 19 mg/dL (ref 8–23)
CO2: 31 mmol/L (ref 22–32)
Calcium: 9.5 mg/dL (ref 8.9–10.3)
Chloride: 102 mmol/L (ref 98–111)
Creatinine: 0.98 mg/dL (ref 0.44–1.00)
GFR, Estimated: >60 mL/min (ref >60)
Glucose, Bld: 98 mg/dL (ref 70–99)
Potassium: 4.9 mmol/L (ref 3.5–5.1)
Sodium: 138 mmol/L (ref 135–145)
Total Bilirubin: 0.4 mg/dL (ref 0.0–1.2)
Total Protein: 6.8 g/dL (ref 6.5–8.1)

## 2023-08-22 LAB — CBC WITH DIFFERENTIAL (CANCER CENTER ONLY)
Abs Immature Granulocytes: 0.03 K/uL (ref 0.00–0.07)
Basophils Absolute: 0.1 K/uL (ref 0.0–0.1)
Basophils Relative: 2 %
Eosinophils Absolute: 0.4 K/uL (ref 0.0–0.5)
Eosinophils Relative: 7 %
HCT: 40.7 % (ref 36.0–46.0)
Hemoglobin: 13.8 g/dL (ref 12.0–15.0)
Immature Granulocytes: 1 %
Lymphocytes Relative: 28 %
Lymphs Abs: 1.5 K/uL (ref 0.7–4.0)
MCH: 34.6 pg — ABNORMAL HIGH (ref 26.0–34.0)
MCHC: 33.9 g/dL (ref 30.0–36.0)
MCV: 102 fL — ABNORMAL HIGH (ref 80.0–100.0)
Monocytes Absolute: 0.6 K/uL (ref 0.1–1.0)
Monocytes Relative: 10 %
Neutro Abs: 3 K/uL (ref 1.7–7.7)
Neutrophils Relative %: 52 %
Platelet Count: 253 K/uL (ref 150–400)
RBC: 3.99 MIL/uL (ref 3.87–5.11)
RDW: 12.5 % (ref 11.5–15.5)
WBC Count: 5.6 K/uL (ref 4.0–10.5)
nRBC: 0 % (ref 0.0–0.2)

## 2023-08-22 NOTE — Assessment & Plan Note (Addendum)
 invasive lobular carcinoma and LCIS, pT1bN0M0 stage IA, grade 2 --Diagnosed 12/2021, s/p right lumpectomy by Dr. Vernetta, I reviewed path which showed 0.9 cm ILC and LCIS, SLN negative, and clear margins. Due to small size, oncotype was not done -She completed adjuvant radiation by Dr. Dewey 02/20/22 - 03/17/22, she tolerated well except radiation dermatitis. -she started adjuvant tamoxifen  in May 2024 --tamoxifen  changed to exemestane  in 07/2022 due to concern for retinal detachment with concurrent prescription of plaquenil.  --Tolerating exemestane  well with minimal and manageable side effects. -- Diagnostic mammogram from December 2024 was benign.  New diagnostic mammogram ordered as part of today's visit. -- Continue exemestane  daily. -- Labs and follow-up expected in 6 months, sooner if needed.

## 2023-08-22 NOTE — Progress Notes (Signed)
 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 Vernetta, I reviewed path which showed 0.9 cm ILC and LCIS, SLN negative, and clear margins. Due to small size, oncotype was not done -She completed adjuvant radiation by Dr. Dewey 02/20/22 - 03/17/22, she tolerated well except radiation dermatitis. -she started adjuvant tamoxifen  in May 2024 --tamoxifen  changed to exemestane  in 07/2022 due to concern for retinal detachment with concurrent prescription of plaquenil.  --Tolerating exemestane  well with minimal and manageable side effects. -- Diagnostic mammogram from December 2024 was benign.  New diagnostic mammogram ordered as part of today's visit. -- Continue exemestane  daily. -- Labs and follow-up expected in 6 months, sooner if needed.   Peripheral neuropathy Patient states that months ago, she started having pain in bilateral hips radiating down both legs.  She did see a rheumatologist as she thought this was a flareup of RA.  They did a CT scan which was negative for bursitis.  They are in the process of ordering an MRI to evaluate for lumbar radiculopathy.  She also had a nerve conduction study done by neurology.  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 She was started on Celebrex and has noticed some  improvement.  She has been on gabapentin  in the past however,she hasn't taken this for years.  Feels like that may be something which may be considered in the future.  Depression/anxiety The patient scored 11 on depression scale today.  She is currently on Viibryd  and has been on this for many years.  She feels like symptoms are overall, well-managed.  She does see psychiatry.  She is looking forward to the birth of her first grandchild.  Plan Labs reviewed. -CBC and CMP are both unremarkable. Reviewed most recent diagnostic mammogram done 12/22/2022 which showed benign results. Diagnostic mammogram ordered as part of today's visit. Continue exemestane  25 mg daily. Labs and follow-up in 6 months, sooner if needed.   The patient understands the plans discussed today and is in agreement with them.  She knows to contact our office if she develops concerns prior to her next appointment.  I provided 25 minutes of face-to-face time during this encounter and > 50% was spent counseling as documented under my assessment and plan.    Powell FORBES Lessen, NP  [facility] - A DEPT OF [facility] [address] [place] [address] Dept: [phone] Dept Fax: [phone]   Orders Placed This Encounter  Procedures   MM 3D DIAGNOSTIC MAMMOGRAM BILATERAL BREAST    [facility] PF; : 12/05/2022@[facility]  No need- no issues- yes hx of br cancer- no implants- no reductions Spoke with pt/ Aware of no show fee 75.00    Standing Status:   Future    Expected Date:   12/05/2023    Expiration Date:   08/21/2024    Reason for  Exam (SYMPTOM  OR DIAGNOSIS REQUIRED):   breast cancer follow up    Preferred imaging location?:   [facility]      CHIEF COMPLAINT:  CC: right breast cancer, ER+  Current Treatment:  exemastane 07/2022 (AI treatment started 05/2022)  INTERVAL HISTORY:  Cynthia Mccullough is here today for repeat clinical assessment. She was last  seen by Lacie, NP, on 02/19/2023. Most recent mammogram done 12/04/2022 was benign with category C breast density.  She states that over the past several months, she just feels like something is not right.  She started with pain in both hips which radiated into both lower legs.  She has seen her rheumatologist.  Was started on Celebrex which is helping some.  She also saw her neurologist and had a nerve conduction study.  This is showing severe sensory neuropathy in both feet and lower legs.  Unsure of treatment, but feels like gabapentin  may be tried in the future.  She has been on this in the past.  She tolerated it well.  States she is just felt like something is not right.  Feels frequently fatigued.  Has had multiple sinus infections.  Feels achy.  Has noticed several changes in her skin as well.  She denies chest pain, chest pressure, or shortness of breath. She denies headaches or visual disturbances. She denies abdominal pain, nausea, vomiting, or changes in bowel or bladder habits.  She denies fevers or chills. She denies pain. Her appetite is good. Her weight has decreased 4 pounds over last 6 months.  I have reviewed the past medical history, past surgical history, social history and family history with the patient and they are unchanged from previous note.  ALLERGIES:  is allergic to amoxicillin, metronidazole, penicillins, sulfonamide derivatives, cheese, atorvastatin, and rosuvastatin.  MEDICATIONS:  Current Outpatient Medications  Medication Sig Dispense Refill   albuterol  (PROVENTIL  HFA;VENTOLIN  HFA) 108 (90 Base) MCG/ACT inhaler Inhale 2 puffs into the lungs every 4 (four) hours as needed for wheezing or shortness of breath. 1 Inhaler 2   allopurinol (ZYLOPRIM) 100 MG tablet Take 100 mg by mouth 3 (three) times daily.     ALPRAZolam  (XANAX ) 0.5 MG tablet Take 0.5-0.75 mg by mouth at bedtime.     amLODipine  (NORVASC ) 5 MG tablet Take 1 tablet (5 mg total) by mouth daily. 30 tablet 1    BEELITH 362-20 MG TABS tablet Take 1 tablet by mouth daily.     celecoxib (CELEBREX) 100 MG capsule Take 100 mg by mouth.     exemestane  (AROMASIN ) 25 MG tablet Take 1 tablet (25 mg total) by mouth daily after breakfast. 90 tablet 3   fenofibrate 160 MG tablet Take 160 mg by mouth at bedtime.      latanoprost (XALATAN) 0.005 % ophthalmic solution Place 1 drop into both eyes at bedtime.     levothyroxine  (SYNTHROID , LEVOTHROID) 50 MCG tablet Take 50 mcg by mouth daily before breakfast.  0   nebivolol  (BYSTOLIC ) 10 MG tablet Take 10 mg by mouth at bedtime.     pantoprazole (PROTONIX) 40 MG tablet Take 40 mg by mouth daily.     traZODone  (DESYREL ) 100 MG tablet Take 1 tablet (100 mg total) by mouth at bedtime. 30 tablet 0   Vilazodone  HCl (VIIBRYD ) 40 MG TABS Take 40 mg by mouth daily.      zaleplon (SONATA) 10 MG capsule Take 2 capsules by mouth at bedtime.     No current facility-administered medications for this visit.  HISTORY OF PRESENT ILLNESS:   Oncology History Overview Note   Cancer Staging  Malignant neoplasm of upper-outer quadrant of right breast in female, estrogen receptor positive (HCC) Staging form: Breast, AJCC 8th Edition - Clinical stage from 12/19/2021: cT1a, cN0, cM0, G2, ER+, PR+ - Unsigned Stage prefix: Initial diagnosis Method of lymph node assessment: Clinical Histologic grading system: 3 grade system - Pathologic stage from 01/09/2022: Stage IA (pT1b, pN0(sn), cM0, G2, ER+, PR+, HER2-) - Signed by Burton, Cynthia Mccullough K, NP on 03/21/2022 Stage prefix: Initial diagnosis Method of lymph node assessment: Sentinel lymph node biopsy Histologic grading system: 3 grade system     Malignant neoplasm of upper-outer quadrant of right breast in female, estrogen receptor positive (HCC)  12/03/2021 Imaging    IMPRESSION: Suspicious 0.5 cm mass in the right breast at the 10 o'clock position.   12/19/2021 Initial Diagnosis   Malignant neoplasm of upper-outer quadrant of right  breast in female, estrogen receptor positive [facility])    Imaging      Genetic Testing   [facility] CancerNext-Expanded Panel+RNA was Negative. Report date is 12/30/2021.  The CancerNext-Expanded gene panel offered by [facility] and includes sequencing, rearrangement, and RNA analysis for the following 77 genes: AIP, ALK, APC, ATM, AXIN2, BAP1, BARD1, BLM, BMPR1A, BRCA1, BRCA2, BRIP1, CDC73, CDH1, CDK4, CDKN1B, CDKN2A, CHEK2, CTNNA1, DICER1, FANCC, FH, FLCN, GALNT12, KIF1B, LZTR1, MAX, MEN1, MET, MLH1, MSH2, MSH3, MSH6, MUTYH, NBN, NF1, NF2, NTHL1, PALB2, PHOX2B, PMS2, POT1, PRKAR1A, PTCH1, PTEN, RAD51C, RAD51D, RB1, RECQL, RET, SDHA, SDHAF2, SDHB, SDHC, SDHD, SMAD4, SMARCA4, SMARCB1, SMARCE1, STK11, SUFU, TMEM127, TP53, TSC1, TSC2, VHL and XRCC2 (sequencing and deletion/duplication); EGFR, EGLN1, HOXB13, KIT, MITF, PDGFRA, POLD1, and POLE (sequencing only); EPCAM and GREM1 (deletion/duplication only).    01/09/2022 Cancer Staging   Staging form: Breast, AJCC 8th Edition - Pathologic stage from 01/09/2022: Stage IA (pT1b, pN0(sn), cM0, G2, ER+, PR+, HER2-) - Signed by Burton, Cynthia Mccullough K, NP on 03/21/2022 Stage prefix: Initial diagnosis Method of lymph node assessment: Sentinel lymph node biopsy Histologic grading system: 3 grade system   01/09/2022 Pathology Results   FINAL MICROSCOPIC DIAGNOSIS: A. BREAST, RIGHT W/SEED, LUMPECTOMY: - Invasive lobular carcinoma, pT1b, pN0 - Lobular carcinoma in situ - Biopsy site changes present - All margins are negative for carcinoma in situ or invasive carcinoma - See oncology table  B. SENTINEL LYMPH NODE, RIGHT AXILLARY, BIOPSY: - Negative for carcinoma (0/1)       02/20/2022 - 03/17/2022 Radiation Therapy   Intent: Curative Radiation Treatment Dates: 02/20/2022 through 03/17/2022 Site Technique Total Dose (Gy) Dose per Fx (Gy) Completed Fx Beam Energies  Breast, Right: Breast_R 3D 42.56/42.56 2.66 16/16 10X  Breast, Right: Breast_R_Bst 3D 8/8 2 4/4 6X, 10X       04/2022 -  Anti-estrogen oral therapy   Began adjuvant tamoxifen     07/2022 -  Anti-estrogen oral therapy   Changed to Exemestane  (due to effects with tamoxifen /plaquenil)   10/03/2022 Survivorship   SCP delivered by Cynthia Mccullough Burton, NP       REVIEW OF SYSTEMS:   Constitutional: Denies fevers, chills or abnormal weight loss.  Fatigue. Eyes: Denies blurriness of vision Ears, nose, mouth, throat, and face: Denies mucositis or sore throat Respiratory: Denies cough, dyspnea or wheezes Cardiovascular: Denies palpitation, chest discomfort or lower extremity swelling Gastrointestinal:  Denies nausea, heartburn or change in bowel habits Skin: Denies abnormal skin rashes.  New spot on right lower leg which she recently noticed. Lymphatics: Denies new lymphadenopathy or easy bruising Neurological:Denies numbness,  tingling or new weaknesses Behavioral/Psych: Mood is stable, no new changes  All other systems were reviewed with the patient and are negative.   VITALS:   Today's Vitals   08/22/23 1332 08/22/23 1349 08/22/23 1356  BP: 120/70    Pulse: 66    Resp: 17    Temp: 98.2 F (36.8 C)    SpO2: 99%    Weight: 184 lb 4.8 oz (83.6 kg)    Height: 5' 8 (1.727 m)    PainSc: 7  7  7     Body mass index is 28.02 kg/m.   Wt Readings from Last 3 Encounters:  08/22/23 184 lb 4.8 oz (83.6 kg)  02/19/23 188 lb (85.3 kg)  10/03/22 189 lb 2 oz (85.8 kg)    Body mass index is 28.02 kg/m.  Performance status (ECOG): 1 - Symptomatic but completely ambulatory  PHYSICAL EXAM:   GENERAL:alert, no distress and comfortable SKIN: skin color, texture, turgor are normal, no rashes or significant lesions EYES: normal, Conjunctiva are pink and non-injected, sclera clear OROPHARYNX:no exudate, no erythema and lips, buccal mucosa, and tongue normal  NECK: supple, thyroid  normal size, non-tender, without nodularity LYMPH:  no palpable lymphadenopathy in the cervical, axillary or  inguinal LUNGS: clear to auscultation and percussion with normal breathing effort HEART: regular rate & rhythm and no murmurs and no lower extremity edema ABDOMEN:abdomen soft, non-tender and normal bowel sounds Musculoskeletal:no cyanosis of digits and no clubbing  NEURO: alert & oriented x 3 with fluent speech, no focal motor/sensory deficits BREAST: Well-healed surgical lumpectomy scar along the areola of right breast.  There are no palpable lumps or masses.  There are expected skin changes associated with radiation present.  There is no nipple inversion or nipple discharge.  There is no axillary lymphadenopathy on the right.  The left breast has no palpable lumps or masses.  There is no nipple inversion or nipple discharge.  There is no axillary lymphadenopathy on the left.  LABORATORY DATA:  I have reviewed the data as listed    Component Value Date/Time   NA 138 08/22/2023 1315   K 4.9 08/22/2023 1315   CL 102 08/22/2023 1315   CO2 31 08/22/2023 1315   GLUCOSE 98 08/22/2023 1315   BUN 19 08/22/2023 1315   CREATININE 0.98 08/22/2023 1315   CALCIUM 9.5 08/22/2023 1315   PROT 6.8 08/22/2023 1315   ALBUMIN 4.3 08/22/2023 1315   AST 20 08/22/2023 1315   ALT 19 08/22/2023 1315   ALKPHOS 36 (L) 08/22/2023 1315   BILITOT 0.4 08/22/2023 1315   GFRNONAA >60 08/22/2023 1315   GFRAA >60 06/10/2018 0038   Lab Results  Component Value Date   WBC 5.6 08/22/2023   NEUTROABS 3.0 08/22/2023   HGB 13.8 08/22/2023   HCT 40.7 08/22/2023   MCV 102.0 (H) 08/22/2023   PLT 253 08/22/2023

## 2023-10-15 ENCOUNTER — Other Ambulatory Visit: Payer: Self-pay | Admitting: Nurse Practitioner

## 2023-11-14 ENCOUNTER — Emergency Department (HOSPITAL_COMMUNITY)

## 2023-11-14 ENCOUNTER — Other Ambulatory Visit: Payer: Self-pay

## 2023-11-14 ENCOUNTER — Emergency Department (HOSPITAL_COMMUNITY)
Admission: EM | Admit: 2023-11-14 | Discharge: 2023-11-14 | Disposition: A | Discharge status: Home / Self Care | Attending: Emergency Medicine | Admitting: Emergency Medicine

## 2023-11-14 DIAGNOSIS — M4802 Spinal stenosis, cervical region: Secondary | ICD-10-CM | POA: Insufficient documentation

## 2023-11-14 DIAGNOSIS — M25512 Pain in left shoulder: Secondary | ICD-10-CM | POA: Diagnosis present

## 2023-11-14 DIAGNOSIS — S42152A Displaced fracture of neck of scapula, left shoulder, initial encounter for closed fracture: Secondary | ICD-10-CM | POA: Insufficient documentation

## 2023-11-14 DIAGNOSIS — W19XXXA Unspecified fall, initial encounter: Secondary | ICD-10-CM

## 2023-11-14 DIAGNOSIS — Z79899 Other long term (current) drug therapy: Secondary | ICD-10-CM | POA: Diagnosis not present

## 2023-11-14 DIAGNOSIS — I10 Essential (primary) hypertension: Secondary | ICD-10-CM | POA: Insufficient documentation

## 2023-11-14 DIAGNOSIS — S42142A Displaced fracture of glenoid cavity of scapula, left shoulder, initial encounter for closed fracture: Secondary | ICD-10-CM | POA: Diagnosis not present

## 2023-11-14 DIAGNOSIS — Z853 Personal history of malignant neoplasm of breast: Secondary | ICD-10-CM | POA: Diagnosis not present

## 2023-11-14 DIAGNOSIS — S0003XA Contusion of scalp, initial encounter: Secondary | ICD-10-CM | POA: Insufficient documentation

## 2023-11-14 DIAGNOSIS — W010XXA Fall on same level from slipping, tripping and stumbling without subsequent striking against object, initial encounter: Secondary | ICD-10-CM | POA: Diagnosis not present

## 2023-11-14 MED ORDER — TRAMADOL HCL 50 MG PO TABS: 50.0000 mg | ORAL_TABLET | Freq: Four times a day (QID) | ORAL | 0 refills | Status: AC | PRN

## 2023-11-14 NOTE — Discharge Instructions (Addendum)
 As we discussed, your x-ray showed possible fracture of the glenoid of the scapula which is next to your shoulder  Please wear your sling for comfort  Take Tylenol  or Motrin  for pain and tramadol  for severe pain  Please call [facility] for follow-up  Return to ER if you have severe pain or another fall

## 2023-11-14 NOTE — ED Triage Notes (Signed)
 Pt. Stated, I fell last night , I tripped. I hit the front of my head and have a bruise, but mainly its my left upper arm and left shoulder. I can't move it.  No LOC, and No blood thinners.

## 2023-11-14 NOTE — Progress Notes (Signed)
 Orthopedic Tech Progress Note Patient Details:  Cynthia Mccullough [facility] September 29, 1950 [id number]  Ortho Devices Type of Ortho Device: Arm sling Ortho Device/Splint Location: LUE Ortho Device/Splint Interventions: Ordered, Application, Adjustment   Post Interventions Patient Tolerated: Well Instructions Provided: Care of device  [name] 11/14/2023, 4:24 PM

## 2023-11-14 NOTE — ED Triage Notes (Signed)
 Pt comes in for fall last night.  Pt endorses left shoulder pain and hitting head.  Pt has bruise to left forehead.  No LOC, no thinners.

## 2023-11-14 NOTE — ED Notes (Signed)
 Extra urine sent to main lab

## 2023-11-14 NOTE — ED Provider Notes (Signed)
 [place] EMERGENCY DEPARTMENT AT [facility] Provider Note   CSN: [id number] Arrival date & time: 11/14/23  1038     Patient presents with: Fall, Head Injury, and Arm Injury   Cynthia Mccullough is a 73 y.o. female history of hypertension, hyperlipidemia, breast cancer in remission here presenting with a fall.  Patient states that she tripped and fell and hit her head and left shoulder.  She states that she has left shoulder pain afterwards and had trouble moving the shoulder.  Denies any loss of consciousness.  Patient is not currently on blood thinners.   The history is provided by the patient.       Prior to Admission medications   Medication Sig Start Date End Date Taking? Authorizing Provider  albuterol  (PROVENTIL  HFA;VENTOLIN  HFA) 108 (90 Base) MCG/ACT inhaler Inhale 2 puffs into the lungs every 4 (four) hours as needed for wheezing or shortness of breath. 12/09/16   Liu, Dana Duo, MD  allopurinol (ZYLOPRIM) 100 MG tablet Take 100 mg by mouth 3 (three) times daily. 03/10/20   [provider]  ALPRAZolam  (XANAX ) 0.5 MG tablet Take 0.5-0.75 mg by mouth at bedtime.    [provider]  amLODipine  (NORVASC ) 5 MG tablet Take 1 tablet (5 mg total) by mouth daily. 03/15/20   Leotis Bogus, MD  BEELITH 362-20 MG TABS tablet Take 1 tablet by mouth daily. 02/10/20   [provider]  celecoxib (CELEBREX) 100 MG capsule Take 100 mg by mouth. 08/15/23   [provider]  exemestane  (AROMASIN ) 25 MG tablet TAKE 1 TABLET EVERY DAY AFTER BREAKFAST 10/15/23   Burton, Lacie K, NP  fenofibrate 160 MG tablet Take 160 mg by mouth at bedtime.     [provider]  latanoprost (XALATAN) 0.005 % ophthalmic solution Place 1 drop into both eyes at bedtime. 01/21/20   [provider]  levothyroxine  (SYNTHROID , LEVOTHROID) 50 MCG tablet Take 50 mcg by mouth daily before breakfast. 11/11/16   [provider]  nebivolol  (BYSTOLIC ) 10 MG  tablet Take 10 mg by mouth at bedtime.    [provider]  pantoprazole (PROTONIX) 40 MG tablet Take 40 mg by mouth daily.    [provider]  traZODone  (DESYREL ) 100 MG tablet Take 1 tablet (100 mg total) by mouth at bedtime. 12/09/16   Lynwood Anes, MD  Vilazodone  HCl (VIIBRYD ) 40 MG TABS Take 40 mg by mouth daily.     [provider]  zaleplon (SONATA) 10 MG capsule Take 2 capsules by mouth at bedtime. 03/12/20   [provider]    Allergies: Amoxicillin, Metronidazole, Penicillins, Sulfonamide derivatives, Cheese, Atorvastatin, and Rosuvastatin    Review of Systems  Musculoskeletal:        Left shoulder pain  All other systems reviewed and are negative.   Updated Vital Signs BP 139/66 (BP Location: Right Arm)   Pulse 66   Temp 99.2 F (37.3 C) (Oral)   Resp 16   Ht 5' 8 (1.727 m)   Wt 83.9 kg   SpO2 96%   BMI 28.13 kg/m   Physical Exam Vitals and nursing note reviewed.  Constitutional:      Appearance: Normal appearance.  HENT:     Head: Normocephalic.     Comments: Small left scalp hematoma and no obvious laceration    Nose: Nose normal.     Mouth/Throat:     Mouth: Mucous membranes are moist.  Eyes:     Extraocular Movements: Extraocular movements  intact.     Pupils: Pupils are equal, round, and reactive to light.  Cardiovascular:     Rate and Rhythm: Normal rate and regular rhythm.     Pulses: Normal pulses.     Heart sounds: Normal heart sounds.  Pulmonary:     Effort: Pulmonary effort is normal.     Breath sounds: Normal breath sounds.  Abdominal:     General: Abdomen is flat.     Palpations: Abdomen is soft.  Musculoskeletal:     Cervical back: Normal range of motion and neck supple.     Comments: Tenderness over the left shoulder.  No obvious dislocation.  Patient is unable to range the shoulder due to pain  Skin:    General: Skin is warm.     Capillary Refill: Capillary refill takes less than 2 seconds.   Neurological:     General: No focal deficit present.     Mental Status: She is alert and oriented to person, place, and time.  Psychiatric:        Mood and Affect: Mood normal.        Behavior: Behavior normal.     (all labs ordered are listed, but only abnormal results are displayed) Labs Reviewed - No data to display  EKG: EKG Interpretation Date/Time:  Wednesday November 14 2023 10:50:10 EST Ventricular Rate:  65 PR Interval:  160 QRS Duration:  84 QT Interval:  448 QTC Calculation: 465 R Axis:   28  Text Interpretation: Normal sinus rhythm Normal ECG When compared with ECG of 09-Jan-2022 08:11, PREVIOUS ECG IS PRESENT Confirmed by Patt Alm DEL [phone]) on 11/14/2023 4:02:23 PM  Radiology: CT Head Wo Contrast Result Date: 11/14/2023 EXAM: CT HEAD WITHOUT CONTRAST 11/14/2023 12:04:00 PM TECHNIQUE: CT of the head was performed without the administration of intravenous contrast. Automated exposure control, iterative reconstruction, and/or weight based adjustment of the mA/kV was utilized to reduce the radiation dose to as low as reasonably achievable. COMPARISON: MRI brain 03/12/2020 and CT head 03/12/2020. CLINICAL HISTORY: Head trauma, minor (Age >= 65y). FINDINGS: BRAIN AND VENTRICLES: No acute intracranial hemorrhage. Overall similar mild scattered white matter hypodensities which are nonspecific but most commonly represent chronic microvascular ischemic changes. No evidence of acute infarct. No hydrocephalus. No extra-axial collection. No mass effect or midline shift. ORBITS: No acute abnormality. SINUSES: No acute abnormality. SOFT TISSUES AND SKULL: Mild contusive change left frontal scalp. No calvarial fracture. IMPRESSION: 1. No acute intracranial hemorrhage or calvarial fracture. 2. Mild contusive change in the left frontal scalp. Electronically signed by: prentice bybordi 11/14/2023 12:56 PM EST RP Workstation: [id number]   CT Cervical Spine Wo Contrast Result Date:  11/14/2023 EXAM: CT CERVICAL SPINE WITHOUT CONTRAST 11/14/2023 12:04:00 PM TECHNIQUE: CT of the cervical spine was performed without the administration of intravenous contrast. Multiplanar reformatted images are provided for review. Automated exposure control, iterative reconstruction, and/or weight based adjustment of the mA/kV was utilized to reduce the radiation dose to as low as reasonably achievable. COMPARISON: None available. CLINICAL HISTORY: Neck trauma (Age >= 65y) FINDINGS: CERVICAL SPINE: BONES AND ALIGNMENT: There is straightening of the normal cervical lordosis. No acute fracture or traumatic malalignment. DEGENERATIVE CHANGES: At C5-C6, there is moderate disc space narrowing and posterior endplate ridging, causing moderate central spinal canal stenosis and moderate right neural foraminal stenosis. There is chronic degenerative disc disease also present at C6-C7 with moderate right neural foraminal stenosis. The left neural foramen is patent. There is moderate left facet arthrosis at C2-C3 and  C3-C4. SOFT TISSUES: There is a nodule within the left lobe of the thyroid , measuring approximately 9 mm. No prevertebral soft tissue swelling. IMPRESSION: 1. No acute abnormality of the cervical spine related to the reported neck trauma. 2. Moderate disc space narrowing and posterior endplate ridging at C5-6, causing moderate central spinal canal stenosis and moderate right neural foraminal stenosis. 3. Chronic degenerative disc disease at C6-7 with moderate right neural foraminal stenosis. The left neural foramen is patent. 4. Moderate left facet arthrosis at C2-3 and C3-4. 5. Incidental left thyroid  nodule measuring approximately 9 mm; in a patient assumed 73 years old and without suspicious features, no thyroid  ultrasound or follow-up is recommended per ACR incidental thyroid  nodule guidelines. Electronically signed by: Evalene Coho MD 11/14/2023 12:47 PM EST RP Workstation: [id number]   DG Shoulder  Left Result Date: 11/14/2023 CLINICAL DATA:  Fall last night with left shoulder pain. EXAM: LEFT SHOULDER - 2+ VIEW COMPARISON:  Chest x-ray 06/10/2018 FINDINGS: Minimal degenerative change of the [facility] joint and glenohumeral joints. There is a lucency over the scapula extending to the glenoid which may represent an acute fracture. Remainder the exam is unremarkable. IMPRESSION: Lucency over the scapula extending to the glenoid which may represent an acute fracture. CT may be helpful for further evaluation. Electronically Signed   By: Toribio Agreste M.D.   On: 11/14/2023 12:36   DG Humerus Left Result Date: 11/14/2023 CLINICAL DATA:  Fall last night with left upper arm pain. EXAM: LEFT HUMERUS - 2+ VIEW COMPARISON:  None Available. FINDINGS: Diffuse decreased bone mineralization. No evidence of acute fracture or dislocation. IMPRESSION: No acute findings. Electronically Signed   By: Toribio Agreste M.D.   On: 11/14/2023 12:34     Procedures   Medications Ordered in the ED - No data to display                                  Cynthia Mccullough is a 73 y.o. female here presenting with left shoulder pain and head injury after a fall.  Went to get CT head and cervical spine and left shoulder x-ray.  4:48 PM I reviewed patient's CT head and cervical spine and there were no fractures.  Patient does have some cervical stenosis.  Left shoulder x-ray showed possible glenoid fracture.  I reviewed the x-ray and agree with the radiology finding.  I told patient that for the glenoid fracture, it is typically treated with pain medicine and sling.  Patient has seen [facility] previously.  Told her to follow-up with [facility] and prescribe tramadol  as needed for pain  Problems Addressed: Cervical stenosis of spine: acute illness or injury Closed fracture of glenoid cavity and neck of left scapula, initial encounter: acute illness or injury Fall, initial encounter: acute  illness or injury  Amount and/or Complexity of Data Reviewed Radiology: ordered and independent interpretation performed. Decision-making details documented in ED Course.     Final diagnoses:  None    ED Discharge Orders     None          Patt Alm Macho, MD 11/14/23 1650

## 2023-12-13 ENCOUNTER — Encounter

## 2024-01-10 ENCOUNTER — Ambulatory Visit
Admission: RE | Admit: 2024-01-10 | Discharge: 2024-01-10 | Disposition: A | Discharge status: Home / Self Care | Source: Ambulatory Visit | Attending: Nurse Practitioner | Admitting: Nurse Practitioner

## 2024-01-10 DIAGNOSIS — Z170 Estrogen receptor positive status [ER+]: Secondary | ICD-10-CM

## 2024-02-21 ENCOUNTER — Inpatient Hospital Stay

## 2024-02-21 ENCOUNTER — Inpatient Hospital Stay: Admitting: Nurse Practitioner
